# Patient Record
Sex: Male | Born: 1975
Health system: Southern US, Community
[De-identification: ages and names within clinical notes are randomized; demographics above are authoritative.]

## PROBLEM LIST (undated history)

## (undated) DIAGNOSIS — F329 Major depressive disorder, single episode, unspecified: Secondary | ICD-10-CM

## (undated) DIAGNOSIS — F32A Depression, unspecified: Secondary | ICD-10-CM

## (undated) DIAGNOSIS — F909 Attention-deficit hyperactivity disorder, unspecified type: Secondary | ICD-10-CM

## (undated) DIAGNOSIS — R109 Unspecified abdominal pain: Secondary | ICD-10-CM

## (undated) DIAGNOSIS — F1110 Opioid abuse, uncomplicated: Secondary | ICD-10-CM

## (undated) DIAGNOSIS — I10 Essential (primary) hypertension: Secondary | ICD-10-CM

## (undated) DIAGNOSIS — F419 Anxiety disorder, unspecified: Secondary | ICD-10-CM

## (undated) DIAGNOSIS — N400 Enlarged prostate without lower urinary tract symptoms: Secondary | ICD-10-CM

## (undated) DIAGNOSIS — G8929 Other chronic pain: Secondary | ICD-10-CM

---

## 1998-08-21 ENCOUNTER — Emergency Department (HOSPITAL_COMMUNITY): Admission: EM | Admit: 1998-08-21 | Discharge: 1998-08-21 | Payer: Self-pay | Admitting: Emergency Medicine

## 1998-08-22 ENCOUNTER — Encounter: Payer: Self-pay | Admitting: Internal Medicine

## 1998-08-22 ENCOUNTER — Emergency Department (HOSPITAL_COMMUNITY): Admission: EM | Admit: 1998-08-22 | Discharge: 1998-08-23 | Payer: Self-pay | Admitting: Emergency Medicine

## 1998-08-23 ENCOUNTER — Emergency Department (HOSPITAL_COMMUNITY): Admission: EM | Admit: 1998-08-23 | Discharge: 1998-08-23 | Payer: Self-pay | Admitting: Emergency Medicine

## 1998-09-05 ENCOUNTER — Emergency Department (HOSPITAL_COMMUNITY): Admission: EM | Admit: 1998-09-05 | Discharge: 1998-09-05 | Payer: Self-pay | Admitting: Emergency Medicine

## 1998-09-21 ENCOUNTER — Emergency Department (HOSPITAL_COMMUNITY): Admission: EM | Admit: 1998-09-21 | Discharge: 1998-09-21 | Payer: Self-pay

## 2005-06-18 ENCOUNTER — Emergency Department (HOSPITAL_COMMUNITY): Admission: EM | Admit: 2005-06-18 | Discharge: 2005-06-18 | Payer: Self-pay | Admitting: Emergency Medicine

## 2005-06-21 ENCOUNTER — Emergency Department (HOSPITAL_COMMUNITY): Admission: EM | Admit: 2005-06-21 | Discharge: 2005-06-21 | Payer: Self-pay | Admitting: *Deleted

## 2005-06-21 ENCOUNTER — Emergency Department (HOSPITAL_COMMUNITY): Admission: EM | Admit: 2005-06-21 | Discharge: 2005-06-21 | Payer: Self-pay | Admitting: Emergency Medicine

## 2005-08-28 HISTORY — PX: APPENDECTOMY: SHX54

## 2005-09-16 ENCOUNTER — Emergency Department (HOSPITAL_COMMUNITY): Admission: EM | Admit: 2005-09-16 | Discharge: 2005-09-16 | Payer: Self-pay | Admitting: Emergency Medicine

## 2005-09-25 ENCOUNTER — Encounter (INDEPENDENT_AMBULATORY_CARE_PROVIDER_SITE_OTHER): Payer: Self-pay | Admitting: Specialist

## 2005-09-25 ENCOUNTER — Inpatient Hospital Stay (HOSPITAL_COMMUNITY): Admission: EM | Admit: 2005-09-25 | Discharge: 2005-09-27 | Payer: Self-pay | Admitting: Family Medicine

## 2005-10-03 ENCOUNTER — Emergency Department (HOSPITAL_COMMUNITY): Admission: EM | Admit: 2005-10-03 | Discharge: 2005-10-03 | Payer: Self-pay | Admitting: Emergency Medicine

## 2005-10-07 ENCOUNTER — Ambulatory Visit (HOSPITAL_COMMUNITY): Admission: RE | Admit: 2005-10-07 | Discharge: 2005-10-07 | Payer: Self-pay | Admitting: *Deleted

## 2005-10-12 ENCOUNTER — Emergency Department (HOSPITAL_COMMUNITY): Admission: EM | Admit: 2005-10-12 | Discharge: 2005-10-12 | Payer: Self-pay | Admitting: Emergency Medicine

## 2006-01-08 ENCOUNTER — Emergency Department (HOSPITAL_COMMUNITY): Admission: EM | Admit: 2006-01-08 | Discharge: 2006-01-08 | Payer: Self-pay | Admitting: Emergency Medicine

## 2006-01-20 ENCOUNTER — Emergency Department (HOSPITAL_COMMUNITY): Admission: EM | Admit: 2006-01-20 | Discharge: 2006-01-21 | Payer: Self-pay | Admitting: Emergency Medicine

## 2006-01-21 ENCOUNTER — Emergency Department (HOSPITAL_COMMUNITY): Admission: EM | Admit: 2006-01-21 | Discharge: 2006-01-22 | Payer: Self-pay | Admitting: *Deleted

## 2006-01-24 ENCOUNTER — Emergency Department: Payer: Self-pay | Admitting: Emergency Medicine

## 2006-02-06 ENCOUNTER — Emergency Department: Payer: Self-pay | Admitting: Emergency Medicine

## 2006-03-05 ENCOUNTER — Emergency Department (HOSPITAL_COMMUNITY): Admission: EM | Admit: 2006-03-05 | Discharge: 2006-03-05 | Payer: Self-pay | Admitting: Family Medicine

## 2006-05-25 ENCOUNTER — Emergency Department (HOSPITAL_COMMUNITY): Admission: EM | Admit: 2006-05-25 | Discharge: 2006-05-25 | Payer: Self-pay | Admitting: Emergency Medicine

## 2006-06-08 ENCOUNTER — Ambulatory Visit: Payer: Self-pay | Admitting: Psychiatry

## 2006-06-08 ENCOUNTER — Inpatient Hospital Stay (HOSPITAL_COMMUNITY): Admission: AD | Admit: 2006-06-08 | Discharge: 2006-06-11 | Payer: Self-pay | Admitting: Psychiatry

## 2006-06-08 ENCOUNTER — Emergency Department (HOSPITAL_COMMUNITY): Admission: EM | Admit: 2006-06-08 | Discharge: 2006-06-08 | Payer: Self-pay | Admitting: Emergency Medicine

## 2006-06-24 ENCOUNTER — Emergency Department (HOSPITAL_COMMUNITY): Admission: EM | Admit: 2006-06-24 | Discharge: 2006-06-24 | Payer: Self-pay | Admitting: Emergency Medicine

## 2006-07-04 ENCOUNTER — Emergency Department (HOSPITAL_COMMUNITY): Admission: EM | Admit: 2006-07-04 | Discharge: 2006-07-04 | Payer: Self-pay | Admitting: Emergency Medicine

## 2006-07-15 ENCOUNTER — Emergency Department: Payer: Self-pay | Admitting: Emergency Medicine

## 2006-07-25 ENCOUNTER — Emergency Department (HOSPITAL_COMMUNITY): Admission: EM | Admit: 2006-07-25 | Discharge: 2006-07-25 | Payer: Self-pay | Admitting: Emergency Medicine

## 2006-08-26 ENCOUNTER — Emergency Department (HOSPITAL_COMMUNITY): Admission: EM | Admit: 2006-08-26 | Discharge: 2006-08-27 | Payer: Self-pay | Admitting: Emergency Medicine

## 2006-09-01 ENCOUNTER — Emergency Department (HOSPITAL_COMMUNITY): Admission: EM | Admit: 2006-09-01 | Discharge: 2006-09-01 | Payer: Self-pay | Admitting: Emergency Medicine

## 2006-09-02 ENCOUNTER — Emergency Department (HOSPITAL_COMMUNITY): Admission: EM | Admit: 2006-09-02 | Discharge: 2006-09-02 | Payer: Self-pay | Admitting: Emergency Medicine

## 2008-01-27 ENCOUNTER — Emergency Department (HOSPITAL_COMMUNITY): Admission: EM | Admit: 2008-01-27 | Discharge: 2008-01-27 | Payer: Self-pay | Admitting: Emergency Medicine

## 2008-01-29 ENCOUNTER — Emergency Department (HOSPITAL_COMMUNITY): Admission: EM | Admit: 2008-01-29 | Discharge: 2008-01-29 | Payer: Self-pay | Admitting: Emergency Medicine

## 2008-02-04 ENCOUNTER — Inpatient Hospital Stay (HOSPITAL_COMMUNITY): Admission: RE | Admit: 2008-02-04 | Discharge: 2008-02-09 | Payer: Self-pay | Admitting: *Deleted

## 2008-02-04 ENCOUNTER — Ambulatory Visit: Payer: Self-pay | Admitting: *Deleted

## 2008-02-10 ENCOUNTER — Emergency Department (HOSPITAL_COMMUNITY): Admission: EM | Admit: 2008-02-10 | Discharge: 2008-02-10 | Payer: Self-pay | Admitting: Family Medicine

## 2008-02-21 ENCOUNTER — Inpatient Hospital Stay (HOSPITAL_COMMUNITY): Admission: EM | Admit: 2008-02-21 | Discharge: 2008-02-23 | Payer: Self-pay | Admitting: *Deleted

## 2008-02-25 ENCOUNTER — Emergency Department (HOSPITAL_COMMUNITY): Admission: EM | Admit: 2008-02-25 | Discharge: 2008-02-25 | Payer: Self-pay | Admitting: Emergency Medicine

## 2008-02-26 ENCOUNTER — Emergency Department (HOSPITAL_COMMUNITY): Admission: EM | Admit: 2008-02-26 | Discharge: 2008-02-26 | Payer: Self-pay | Admitting: Emergency Medicine

## 2008-02-28 ENCOUNTER — Emergency Department (HOSPITAL_COMMUNITY): Admission: EM | Admit: 2008-02-28 | Discharge: 2008-02-28 | Payer: Self-pay | Admitting: Emergency Medicine

## 2008-04-11 ENCOUNTER — Ambulatory Visit: Payer: Self-pay | Admitting: Family Medicine

## 2008-05-01 ENCOUNTER — Emergency Department (HOSPITAL_COMMUNITY): Admission: EM | Admit: 2008-05-01 | Discharge: 2008-05-01 | Payer: Self-pay | Admitting: Emergency Medicine

## 2008-05-10 ENCOUNTER — Emergency Department: Payer: Self-pay | Admitting: Emergency Medicine

## 2008-05-16 ENCOUNTER — Ambulatory Visit: Payer: Self-pay | Admitting: Internal Medicine

## 2008-05-17 ENCOUNTER — Emergency Department (HOSPITAL_COMMUNITY): Admission: EM | Admit: 2008-05-17 | Discharge: 2008-05-18 | Payer: Self-pay | Admitting: Emergency Medicine

## 2008-05-17 ENCOUNTER — Encounter (INDEPENDENT_AMBULATORY_CARE_PROVIDER_SITE_OTHER): Payer: Self-pay | Admitting: Family Medicine

## 2008-05-18 ENCOUNTER — Emergency Department (HOSPITAL_COMMUNITY): Admission: EM | Admit: 2008-05-18 | Discharge: 2008-05-18 | Payer: Self-pay | Admitting: Emergency Medicine

## 2008-05-19 ENCOUNTER — Inpatient Hospital Stay (HOSPITAL_COMMUNITY): Admission: AD | Admit: 2008-05-19 | Discharge: 2008-05-22 | Payer: Self-pay | Admitting: Psychiatry

## 2008-05-19 ENCOUNTER — Ambulatory Visit: Payer: Self-pay | Admitting: Psychiatry

## 2008-06-14 ENCOUNTER — Inpatient Hospital Stay (HOSPITAL_COMMUNITY): Admission: RE | Admit: 2008-06-14 | Discharge: 2008-06-15 | Payer: Self-pay | Admitting: Psychiatry

## 2008-06-17 ENCOUNTER — Emergency Department (HOSPITAL_COMMUNITY): Admission: EM | Admit: 2008-06-17 | Discharge: 2008-06-17 | Payer: Self-pay | Admitting: Emergency Medicine

## 2008-06-18 ENCOUNTER — Emergency Department (HOSPITAL_COMMUNITY): Admission: EM | Admit: 2008-06-18 | Discharge: 2008-06-18 | Payer: Self-pay | Admitting: Emergency Medicine

## 2008-07-31 ENCOUNTER — Emergency Department (HOSPITAL_COMMUNITY): Admission: EM | Admit: 2008-07-31 | Discharge: 2008-07-31 | Payer: Self-pay | Admitting: Emergency Medicine

## 2008-08-06 ENCOUNTER — Emergency Department (HOSPITAL_COMMUNITY): Admission: EM | Admit: 2008-08-06 | Discharge: 2008-08-06 | Payer: Self-pay | Admitting: Emergency Medicine

## 2008-10-15 ENCOUNTER — Emergency Department (HOSPITAL_COMMUNITY): Admission: EM | Admit: 2008-10-15 | Discharge: 2008-10-15 | Payer: Self-pay | Admitting: Emergency Medicine

## 2008-11-13 ENCOUNTER — Emergency Department (HOSPITAL_COMMUNITY): Admission: EM | Admit: 2008-11-13 | Discharge: 2008-11-13 | Payer: Self-pay | Admitting: Emergency Medicine

## 2008-11-18 ENCOUNTER — Emergency Department (HOSPITAL_COMMUNITY): Admission: EM | Admit: 2008-11-18 | Discharge: 2008-11-19 | Payer: Self-pay | Admitting: Emergency Medicine

## 2008-12-06 ENCOUNTER — Emergency Department (HOSPITAL_COMMUNITY): Admission: EM | Admit: 2008-12-06 | Discharge: 2008-12-06 | Payer: Self-pay | Admitting: Emergency Medicine

## 2008-12-07 ENCOUNTER — Emergency Department (HOSPITAL_COMMUNITY): Admission: EM | Admit: 2008-12-07 | Discharge: 2008-12-07 | Payer: Self-pay | Admitting: Emergency Medicine

## 2008-12-10 ENCOUNTER — Emergency Department (HOSPITAL_COMMUNITY): Admission: EM | Admit: 2008-12-10 | Discharge: 2008-12-10 | Payer: Self-pay | Admitting: Emergency Medicine

## 2008-12-12 ENCOUNTER — Emergency Department (HOSPITAL_COMMUNITY): Admission: EM | Admit: 2008-12-12 | Discharge: 2008-12-12 | Payer: Self-pay | Admitting: Emergency Medicine

## 2010-10-08 LAB — CBC
HCT: 41.5 % (ref 39.0–52.0)
HCT: 42.5 % (ref 39.0–52.0)
Hemoglobin: 14.1 g/dL (ref 13.0–17.0)
Hemoglobin: 14.3 g/dL (ref 13.0–17.0)
MCHC: 34.1 g/dL (ref 30.0–36.0)
MCV: 95 fL (ref 78.0–100.0)
Platelets: 193 10*3/uL (ref 150–400)
RBC: 4.47 MIL/uL (ref 4.22–5.81)
RDW: 13.5 % (ref 11.5–15.5)
RDW: 14 % (ref 11.5–15.5)
WBC: 7.8 10*3/uL (ref 4.0–10.5)

## 2010-10-08 LAB — URINALYSIS, ROUTINE W REFLEX MICROSCOPIC
Hgb urine dipstick: NEGATIVE
Protein, ur: NEGATIVE mg/dL
Specific Gravity, Urine: 1.012 (ref 1.005–1.030)
pH: 6.5 (ref 5.0–8.0)

## 2010-10-08 LAB — DIFFERENTIAL
Basophils Absolute: 0 10*3/uL (ref 0.0–0.1)
Basophils Relative: 0 % (ref 0–1)
Eosinophils Absolute: 0.1 10*3/uL (ref 0.0–0.7)
Eosinophils Relative: 1 % (ref 0–5)
Lymphs Abs: 1.4 10*3/uL (ref 0.7–4.0)
Monocytes Absolute: 0.4 10*3/uL (ref 0.1–1.0)
Neutro Abs: 11.2 10*3/uL — ABNORMAL HIGH (ref 1.7–7.7)
Neutro Abs: 5.8 10*3/uL (ref 1.7–7.7)

## 2010-10-08 LAB — RAPID URINE DRUG SCREEN, HOSP PERFORMED
Amphetamines: NOT DETECTED
Barbiturates: NOT DETECTED
Barbiturates: NOT DETECTED
Benzodiazepines: NOT DETECTED
Cocaine: POSITIVE — AB
Opiates: POSITIVE — AB
Tetrahydrocannabinol: NOT DETECTED

## 2010-10-08 LAB — POCT I-STAT, CHEM 8
BUN: 11 mg/dL (ref 6–23)
Chloride: 105 mEq/L (ref 96–112)
HCT: 45 % (ref 39.0–52.0)
Hemoglobin: 15.3 g/dL (ref 13.0–17.0)
Potassium: 4 mEq/L (ref 3.5–5.1)
Sodium: 139 mEq/L (ref 135–145)

## 2010-10-08 LAB — ETHANOL
Alcohol, Ethyl (B): <5 mg/dL (ref 0–10)
Alcohol, Ethyl (B): <5 mg/dL (ref 0–10)

## 2010-10-08 LAB — BASIC METABOLIC PANEL
BUN: 9 mg/dL (ref 6–23)
Calcium: 8.9 mg/dL (ref 8.4–10.5)
GFR calc Af Amer: >60 mL/min (ref >60)
GFR calc non Af Amer: >60 mL/min (ref >60)
Glucose, Bld: 99 mg/dL (ref 70–99)
Potassium: 3.5 mEq/L (ref 3.5–5.1)

## 2010-10-08 LAB — TRICYCLICS SCREEN, URINE: TCA Scrn: NOT DETECTED

## 2010-11-12 NOTE — H&P (Signed)
[name], [name] NO.:  [id number]   MEDICAL RECORD NO.:  [id number]          PATIENT TYPE:  IPS   LOCATION:  0605                          FACILITY:  [facility]   PHYSICIAN:  Jasmine Pang, M.D. DATE OF BIRTH:  [birth year]   DATE OF ADMISSION:  02/05/2008  DATE OF DISCHARGE:                       PSYCHIATRIC ADMISSION ASSESSMENT   This is a voluntary admission to the services of Dr. Milford Cage.   IDENTIFYING INFORMATION:  This is a 35 year old single white male.  He  was just released from prison around January 25, 2008.  He cannot find  work.  He reports having relapsed on cocaine on February 03, 2008.  He has  been staying with his parents.  He reports that he has severe depression  and anxiety.  He also reports ADD.  His current stressors are financial  issues.  No employment.  He states that when he was here with [place] last  back in 2007, he had benefited from Lexapro and Adderall.  He states his  mother is willing to buy his medication and he is currently being  treated for prostatitis.  He states that he has been eating okay, but he  has some trouble with sleep.   PAST PSYCHIATRIC HISTORY:  [name] was last with [place] June 08, 2006 to  June 11, 2006.  At that time, he was noted to have been suicidal  with attempt.  He had overdosed on OxyContin.  He also endorsed OCD and  history of ADHD at that time.   SOCIAL HISTORY:  He is a high Garment/textile technologist in 1996.  He has never  married.  He does not have any children.  He used to be employed in  [employer].   FAMILY HISTORY:  Noncontributory.   ALCOHOL/DRUG HISTORY:  He began using drugs and alcohol in his 39s, THC  in his 100s, alcohol at 42 and cocaine at 50.   PRIMARY CARE Supriya Beaston:  He is currently enrolled with the [facility].   MEDICAL PROBLEMS:  Prostatitis.   MEDICATIONS:  1. He was prescribed doxycycline 100 mg p.o. daily.  2. Cardura 1 mg p.o. in the a.m. and 2 mg at  bedtime.  3. Percocet 5/325 two q.6 h. p.r.n. pain from prostatitis.   DRUG ALLERGIES:  NO KNOWN DRUG ALLERGIES.   PHYSICAL EXAMINATION:  He was just recently medically cleared in the  emergency room after being discharged several days ago.  His labs were  repeated on admission.  His glucose was elevated at 133.  His CBC had no  abnormalities.  His urinalysis which was done January 29, 2008 was also  benign.  He had no abnormal findings.  No indication that his  prostatitis was active.  We do not have any urine drug screen available,  although he reports a recent relapse on cocaine.  Vital signs on  admission show he is 67 inches tall.  He weighs 151, temperature is  97.2, blood pressure is 109/63 to 103/66, pulse was 73-76 and  respirations are 18.  He has a scar consistent with his report of  an  appendectomy in 2007.  Other than prostatitis, he has no other active  medical issues.   MENTAL STATUS EXAM:  Today, he was alert and oriented.  His appearance  was somewhat disheveled.  His eye contact was fair.  His speech was slow  and soft.  His level of consciousness was alert.  His mood was depressed  and anxious.  His anxiety level was reported as severe.  His thought  processes were coherent.  His thought content was within normal limits.  His perceptions were normal.  He does report suicidal ideation with no  specific plan.  He is not homicidal.  He has no auditory or visual  hallucinations.   AXIS I:  Major depressive disorder severe without psychotic features.  History for attention deficit hyperactivity disorder treated with  Adderall in the past.  AXIS II:  Deferred.  AXIS III:  History for prostatitis currently being treated with Cardura  and doxycycline. AXIS IV:  Severe.  AXIS V:  30.   PLAN:  Admit for safety and stabilization.  We will try to start  medication to address his depression and suicidal ideation.  Toward that  end, we will start Celexa 10 mg a.m. and h.s.  We  will discuss generic  Adderall if his mother can afford this.  We will have the case manager  work with placement.  He has done [employer] in 2001 and that may  be a way to get him employed.  Estimated length of stay is 3-5 days.      Mickie Leonarda Salon, P.A.-C.      Jasmine Pang, M.D.  Electronically Signed    MD/MEDQ  D:  02/05/2008  T:  02/05/2008  Job:  [id number]

## 2010-11-12 NOTE — H&P (Signed)
[name], [name] NO.:  [id number]   MEDICAL RECORD NO.:  [id number]          PATIENT TYPE:  IPS   LOCATION:  0502                          FACILITY:  [facility]   PHYSICIAN:  Anselm Jungling, MD  DATE OF BIRTH:  Feb 22, 1976   DATE OF ADMISSION:  05/19/2008  DATE OF DISCHARGE:                       PSYCHIATRIC ADMISSION ASSESSMENT   This is a voluntary admission to the services of Dr. Geralyn Flash.   IDENTIFYING INFORMATION:  This is a 35 year old single white male.  He  originally presented to the emergency department on November 19.  He was  assessed and referred for outpatient treatment then he returned changing  his tune.  He was now suicidal with a plan to use carbon monoxide or  jump from a height.  He reported frequent panic attacks, feeling  hopeless, anhedonia, sleeping all day, OCD symptoms including compulsive  shoplifting and lock checking.  He reported relapsing on alcohol last  week, stated he drank a fifth of liquor within 24 hours but his alcohol  level was only 41.  Stressors include unemployment, conflicts with his  parents, financial stress and poor support.  He had also fallen out of a  deer stand hurting himself and that was his original presentation to the  ED.   PAST PSYCHIATRIC HISTORY:  This is his third or fourth admission here to  the [facility].  He was last with [place] August 24 to August 26.  He frequently feels suicidal and that is why he comes into hospital.  He  was also dealing with prostatitis back in August.   SOCIAL HISTORY:  He has never married.  He has no children.  He states  he has looked at the quick jobs program out at [facility] but never followed  through.   FAMILY HISTORY:  Noncontributory.   ALCOHOL AND DRUG HISTORY:  He reports that he has been using alcohol  since he was a teenager.  He states that he recently relapsed on  alcohol.   PRIMARY CARE PHYSICIAN:  It is not clear, he does not have one at  present.  His urologist is Dr. Vernie Ammons.  Again, he has not seen Dr.  Vernie Ammons recently.  He reports having had blood in his urine and hence he  was felt to have prostatitis the other day.  He is followed at [facility] by Dr. Allyne Gee who writes his meds.  He is status  post an appendectomy in 2007.   CURRENT MEDICATIONS:  1. Cymbalta 60 mg take two in the morning for a total of 120 mg.  2. Adderall 10 mg a.m. and 10 mg at noon.  3. Percocet 5 mg p.o. take two q.6 h p.r.n.  4. Klonopin 0.5 mg p.o. b.i.d.  5. Cipro 500 mg p.o. daily.   DRUG ALLERGIES:  He is allergic to CELEXA.  It gave him hives and he  could not breathe.   POSITIVE PHYSICAL FINDINGS:  His alcohol level was 41.  His urine was  positive for opiates, benzos and amphetamines but he is prescribed.  He  does have  scattered bruises and abrasions consistent with his fall out  of the deer stand.  He is status post removal of a sebaceous cyst in his  left cheek on May 18, 2008.  His vital signs on admission show he  is 69 inches tall, weighs 157-1/2, temperature is 97.6, blood pressure  is 124/77 to 111/68, pulse ranged from 86-98, respirations are 16-18.   MENTAL STATUS EXAM:  Today he is alert and oriented.  He was seen in the  bed.  He was appropriately groomed, dressed and nourished.  His speech  is normal rate, rhythm and tone.  His mood is depressed as is his  affect.  His thought processes are clear, rational and goal oriented.  He does not want any of his present medicines changed.  Judgment and  insight are fair.  Concentration and memory are intact.  Intelligence is  at least average.  He is not actively suicidal or homicidal.  He is not  having any auditory visual hallucinations.   DIAGNOSES:  AXIS I:  Mood disorder, depressed, ADHD (attention deficit  hyperactivity disorder) on medications since age 27 for 25 years.  AXIS II:  Deferred.  AXIS III:  Prostatitis for a number of months, recent  trauma fell out of  a deer stand healing abrasions and bruises.  AXIS IV:  Severe - problems with primary support group, occupational,  housing, economic issues.  AXIS V:  30.   PLAN:  Is to admit for safety and stabilization.  To adjust his meds as  indicated.  Dr. Dub Mikes has already evaluated the patient today and just  wants to continue current meds.  We may have to consider getting a  urology consult on Monday as his prostatitis should have cleared by now.  Estimated length of stay is 3-5 days.      Mickie Leonarda Salon, P.A.-C.      Anselm Jungling, MD  Electronically Signed    MD/MEDQ  D:  05/20/2008  T:  05/20/2008  Job:  [id number]

## 2010-11-12 NOTE — Discharge Summary (Signed)
[name], [name] NO.:  [id number]   MEDICAL RECORD NO.:  [id number]          PATIENT TYPE:  IPS   LOCATION:  0302                          FACILITY:  [facility]   PHYSICIAN:  Jasmine Pang, M.D. DATE OF BIRTH:  [birth year]   DATE OF ADMISSION:  02/21/2008  DATE OF DISCHARGE:  02/23/2008                               DISCHARGE SUMMARY   IDENTIFYING INFORMATION:  This is a 35 year old single white male who  was admitted on a voluntary basis on February 21, 2008.   HISTORY OF PRESENT ILLNESS:  The patient states he began to feel  suicidal.  He was worried he could not find a job.  He began to feel  depressed and anxious for the past several days and this culminated in  increasing suicidal ideation.  He states he was going to the [facility] and they just increased the Celexa within the past week, but he  kept taking the 20 mg instead of 40 mg it was increased to.  He is  willing now to increase his dose of Celexa to 40 mg.  He denies any drug  use now, but has used cocaine in the past.  He denies any alcohol use.   PAST PSYCHIATRIC HISTORY:  The patient was here in August 2009.  He has  a history of cutting.  As indicated above, he goes to the [facility] for psychiatric management.   ALCOHOL AND DRUG HISTORY:  As indicated above.  No alcohol or drug use  now.  He has used cocaine in the past.   MEDICAL PROBLEMS:  Prostatitis.   MEDICATIONS:  1. Adderall 10 mg in the a.m. and noon.  2. Celexa 20 mg q.a.m. (supposed to be increased to 40 mg, but he has      not done this yet.)  3. Cardura 2 mg at bedtime for prostatitis.  4. Doxycycline 100 mg p.o. b.i.d. for prostatitis.  5. Percocet 5 mg p.o. q.4-6 hours p.r.n. pain.   PHYSICAL FINDINGS:  There were no acute physical or medical problems  noted other than his prostatitis.   ADMISSION LABORATORIES:  DNA probe was negative for chlamydia and  gonorrhea.  The UDS was negative.   HOSPITAL COURSE:  Upon admission, the patient was restarted on his home  medications with doxycycline 100 mg b.i.d., Cardura 2 mg p.o. q.h.s.,  Adderall 10 mg in the morning and at noon, Percocet 5 mg one q.4-6 h  p.r.n. for pain, and Celexa was increased from 20 mg daily to 40 mg  daily.  He also was given some Imodium for diarrhea, which helped  resolve this.  He was given Ambien 10 mg p.o. q.h.s. p.r.n. insomnia.  In individual sessions with me, the patient was reserved, but friendly  and cooperative.  He did participate appropriately in unit therapeutic  groups and activities.  He discussed his escalating depression and  anxiety in the past several days.  He states he began to feel suicidal.  He is worried he cannot get a job.  He felt hopeless and helpless.  As  hospitalization progressed, mental status improved.  On February 23, 2008,  mood was much less depressed and less anxious.  Affect consistent with  mood.  There was no suicidal or homicidal ideation.  No thoughts of self-  injurious behavior.  No auditory or visual hallucinations.  No paranoia  or delusions.  Thoughts were logical and goal-directed.  Thought  content, no predominant theme.  Cognitive was grossly intact.  Insight  was good.  Judgment was good.  Impulse control was good.  He wanted to  go home today and was felt he was safe for discharge.   DISCHARGE DIAGNOSES:  Axis I: Major depressive disorder, recurrent,  severe without psychosis.  Axis II: None.  Axis III: Prostatitis.  Axis IV: Severe (problems with primary support group, occupational  problem, economic problem, medical problems, and burden of psychiatric  illness).  Axis V: Global assessment of functioning is 50 upon discharge.  GAF was  35 upon admission.  GAF highest past year was 65.   DISCHARGE PLAN:  There was no specific activity level or dietary  restriction.   POSTHOSPITAL CARE PLANS:  The patient will see Dr. Lang Snow at the  [facility] on  February 29, 2008, at 1 o'clock p.m.  He will also go  to [facility] to see Gerda Diss on March 07, 2008, at 12:30  p.m.   DISCHARGE MEDICATIONS:  1. Doxycycline 100 mg twice daily.  2. Adderall tablets 10 mg in the morning and noon.  3. Celexa 40 mg daily.  4. Cardura 2 mg at bedtime.  5. Ambien 10 mg at bedtime as needed for sleep.  6. Take Percocet as advised by his urologist.      Jasmine Pang, M.D.  Electronically Signed     BHS/MEDQ  D:  02/27/2008  T:  02/28/2008  Job:  [id number]

## 2010-11-15 NOTE — H&P (Signed)
[name], CHENIER               ACCOUNT NO.:  [id number]   MEDICAL RECORD NO.:  [id number]          PATIENT TYPE:  INP   LOCATION:  5735                         FACILITY:  [facility]   PHYSICIAN:  Ollen Gross. Vernell Morgans, M.D. DATE OF BIRTH:  October 24, 1975   DATE OF ADMISSION:  09/24/2005  DATE OF DISCHARGE:                                HISTORY & PHYSICAL   HISTORY OF PRESENT ILLNESS:  Mr. [name] is a 35 year old white male who  presents to the emergency department tonight with a two week history of  right lower quadrant pain.  He has intermittently been having fevers to 101  at home.  He has had some diarrhea in the last two weeks.  No real nausea or  vomiting.  His pain has not resolved which caused him to seek medical  attention.  He otherwise denies any chest pain, shortness of breath, or  dysuria.  Other review of systems unremarkable.   PAST MEDICAL HISTORY:  None.   PAST SURGICAL HISTORY:  None.   MEDICATIONS:  None.   ALLERGIES:  IBUPROFEN.   SOCIAL HISTORY:  He does smoke about a pack of cigarettes a day, and only  drinks alcohol on the weekends.   FAMILY HISTORY:  Significant for hypertension and diabetes.   PHYSICAL EXAMINATION:  VITAL SIGNS:  Temperature is 99.5, blood pressure  108/65, pulse 97.  GENERAL:  He is a well-developed, well-nourished white male in no acute  distress.  SKIN:  Warm and dry with no jaundice.  HEENT:  Eyes:  His extraocular movements are intact.  Pupils equal, round,  reactive to light.  Sclerae non-icteric.  LUNGS:  Clear bilaterally with no use of accessory respiratory muscles.  HEART:  Regular rate and rhythm with an impulse in the left chest.  ABDOMEN:  Soft with focal right lower quadrant tenderness, but no signs of  peritonitis.  EXTREMITIES:  No cyanosis, clubbing, or edema.  Good strength in his arms  and legs.  PSYCHIATRIC:  He is alert and oriented x3 with no evidence of anxiety or  depression.   LABORATORY DATA:  On review of his  lab work, it was significant for a white  count of 13.7.  His CT scan was reviewed with the radiologist and did show a  very enlarged and inflamed appendix with a large appendicolith.  This  appendicolith has been present on plain films since at least December.   ASSESSMENT AND PLAN:  This is a 35 year old white male with what appears to  be acute appendicitis with a large inflamed appendix.  Because of the risk  of perforation and sepsis, I think he would benefit from having his  appendix removed tonight.  I have explained to him in detail the risks and  benefits of the operation to do this, as well as some of the technical  aspects, and he understands and wishes to proceed.  We will obtain some  routine preoperative laboratory work in preparation for doing this tonight.      Ollen Gross. Vernell Morgans, M.D.  Electronically Signed     PST/MEDQ  D:  09/25/2005  T:  09/25/2005  Job:  [id number]

## 2010-11-15 NOTE — Discharge Summary (Signed)
Stephen, French NO.:  [id number]   MEDICAL RECORD NO.:  [id number]          PATIENT TYPE:  IPS   LOCATION:  0502                          FACILITY:  [facility]   PHYSICIAN:  Geoffery Lyons, M.D.      DATE OF BIRTH:  Sep 30, 1975   DATE OF ADMISSION:  05/19/2008  DATE OF DISCHARGE:  05/22/2008                               DISCHARGE SUMMARY   CHIEF COMPLAINT:  This was the third or fourth admission to [facility] for this 35 year old single white male, who presented  to the emergency department on May 18, 2008.  Initially he was  assessed and referred for outpatient treatment.  He came back stating he  was suicidal, with a plan to use carbon monoxide or jump from a height.  He endorsed frequent panic attacks, feeling hopeless, anhedonic,  sleeping all day, OCD symptoms including compulsive shoplifting and lock  shaking.  He reported relapsing on alcohol the week before, drinking a  fifth of liquor within 24 hours.  Stressors include unemployment,  conflict with his parents, financial stress and poor support.  He also  had a fall from a deer stand, hurting himself.   PAST PSYCHIATRIC HISTORY:  The third or fourth time at [facility].  His last admission was November 24-26, 2009.  Endorsed that he  frequently feels suicidal and that is when he comes to the hospital.   PAST MEDICAL HISTORY:  1. He has been using alcohol since he was 35 years old; recently      relapsed.  2. History of prostatitis.   MEDICATIONS:  1. Cymbalta 50 mg in the morning and in the afternoon.  2. Adderall 10 mg in the morning and in the afternoon.  3. Percocet 5 every 6 hours as needed for pain.  4. Klonopin 0.5 twice a day.  5. Cipro 500 mg per day.   PHYSICAL EXAMINATION:  Failed to show any acute findings other than  scattered bruises and abrasions,  consistent with the fall.   MENTAL STATUS EXAMINATION:  Revealed an alert, cooperative male;  appropriately  groomed, dressed and nourished.  Speech was normal rate,  rhythm and tone.  Mood is depressed, anxious.  Affect anxious.  Thought  processes are clear, rational and goal oriented.  He did not want his  medication to be changed.  Endorsed no active suicidal or homicidal  ideas.  No evidence of delusions or hallucinations.  Cognition well-  preserved.   ASSESSMENT:  AXIS I:  1. Mood disorder NOS.  2. Attention deficit hyperactivity disorder.  3. Anxiety disorder NOS.  4. Alcohol abuse, rule out dependence.  AXIS II: No diagnosis.  AXIS III:  1. Prostatitis.  2. Status post trauma, given abrasions and bruises.  AXIS IV: Moderate.  AXIS V:  On admission 38, in the last year 60.   The patient was admitted and started with individual and group  psychotherapy.  We went ahead and maintained the medications.  As  already stated, a 35 year old male; single, living with parents.  He  endorsed he got to a  point he was feeling that life was not worth  living.  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  He has no money.  Getting worse.  He has been to  [facility] twice since August 2009.  Has been admitted with pain  after he fell from a deer stand.   On May 21, 2008 he was in full contact with reality.  He wanted to  be discharged, and endorsed at that time with no suicidal or homicidal  ideas, no hallucination or delusions.  He endorsed that he was aware  that he could not drink; will go back to AA and go see his sponsor.   DISCHARGE DIAGNOSES:  AXIS I:  1. Attention deficit hyperactivity disorder.  2. Anxiety disorder NOS.  3. Alcohol abuse, rule out dependence.  AXIS II: No diagnosis.  AXIS III:  1. Prostatitis.  2. Status post trauma after fall, with abrasions and bruises.  AXIS IV: Moderate.  AXIS V:  Upon discharge 50-55.   DISCHARGE MEDICATIONS:  1. Cipro 500 mg per day.  2. Cymbalta 60 mg per day.  3. Adderall 10 mg twice a day.   4. Trazodone 50 mg at night.  5. Klonopin 0.5 at night, as needed for sleep.      Geoffery Lyons, M.D.  Electronically Signed     IL/MEDQ  D:  06/21/2008  T:  06/22/2008  Job:  [id number]
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
## 2010-11-15 NOTE — Op Note (Signed)
NAMECONER, GIBBARD               ACCOUNT NO.:  [id number]   MEDICAL RECORD NO.:  [id number]          PATIENT TYPE:  INP   LOCATION:  5735                         FACILITY:  [facility]   PHYSICIAN:  Ollen Gross. Vernell Morgans, M.D. DATE OF BIRTH:  17-Oct-1975   DATE OF PROCEDURE:  09/25/2005  DATE OF DISCHARGE:                                 OPERATIVE REPORT   PREOPERATIVE DIAGNOSIS:  Appendicitis.   POSTOP DIAGNOSIS:  Appendicitis.   PROCEDURES:  Aborted laparoscopic and subsequent open appendectomy.   SURGEON:  Dr. Carolynne Edouard.   ANESTHESIA:  General endotracheal.   PROCEDURE:  After informed consent was obtained, the patient was brought to  the operating placed in supine position on the operating room table.  After  induction of general anesthesia, the patient's abdomen was prepped with  Betadine and draped in usual sterile manner.  The area below the umbilicus  was infiltrated 0.25% Marcaine.  Small incision was made with a 15 blade  knife.  This incision was carried down through the subcutaneous tissue  bluntly with a hemostat and [employer]y retractors until the linea alba was  identified.  The linea alba incised with a 15 blade knife and each side was  grasped Kocher clamps, elevated anteriorly.  The preperitoneal space was  then probed bluntly with a hemostat until the peritoneum was opened and  access was gained to the abdominal cavity.  A 0 Vicryl pursestring stitch  was placed in the fascia surrounding the opening.  A Hasson cannula was  placed through the opening and anchored in place with the previously placed  Vicryl pursestring stitch.  The abdomen was then insufflated with carbon  dioxide without difficulty.  The patient was placed in Trendelenburg  position, rotated with the right side up.  The suprapubic area was then  infiltrated 0.25% Marcaine.  A small incision was made with a 15 blade knife  and a 10 mm port was placed bluntly through this incision into the abdominal  cavity  under direct vision between these two and a site was chosen for a 5  mm port.  This area was infiltrated 0.25% Marcaine.  A small stab incision  was made with a 15 blade knife and a 5 mm port was placed bluntly through  this incision into the abdominal cavity under direct vision.  The  laparoscope was then moved to the suprapubic port and using a Glassman  grasper and Harmonic scalpel, the right lower quadrant was evaluated.  The  right colon was able to be peeled away from the peritoneum and  retroperitoneum on the right side.  As this was done a small pocket of  infection was encountered which was aspirated and what appeared to be a  appendicolith was then visualized free in this pocket.  A laparoscopic bag  was then inserted through the Hasson cannula and used to pick up the  appendicolith.  The bag was then closed and brought out with the Hasson  cannula through the infraumbilical port without difficulty.  The Hasson  cannula was then replaced.  The right lower quadrant was inspected more  but  it appeared as though the inflammation was too much to really separate any  of the structures.  At this point a laparoscopic portion of the procedure  was stopped.  A site was chosen for a right lower quadrant transverse  incision.  This incision was made with a 15 blade knife.  The incision was  carried down through the skin and subcutaneous tissue, fascia and muscle of  the abdominal wall until the abdomen was entered, Richardson retractors were  used to retract on the wound.  The cecum was gradually mobilized up into the  wound by blunt finger dissection.  The takeoff and junction of the appendix  with the cecum was able to be identified.  The mesoappendix was opened at  this point bluntly with a Kelly clamp.  A laparoscopic 45 stapler with a  blue load was then placed across the base of the appendix at its junction  with the cecum, clamped and fired thereby dividing base of the appendix   between staple lines.  The rest of the appendix was mobilized bluntly with  finger dissection.  The mesoappendix was taken down with the harmonic  scalpel.  Once this was accomplished, the appendix was removed from the  patient and sent to pathology for further evaluation.  The abdomen was then  irrigated with copious amounts of saline.  The staple line appeared to be  viable and intact.  At this point the layers of the fascia of the abdominal  wall was closed in two layers with two running #1 PDS sutures. Each layer  was irrigated as it was closed and the rest of ports removed under direct  vision and the fascial defect of the infraumbilical port was closed with the  previously Vicryl pursestring stitch.  The laparoscopic ports were closed  with staples and the right lower quadrant transverse incision was also  closed loosely with staples and Telfa wicks were placed between the staples.  Sterile dressings were then applied.  The patient tolerated procedure well.  At the end of the case all needle, sponge and instrument counts correct.  The patient was awakened and taken recovery in stable condition.      Ollen Gross. Vernell Morgans, M.D.  Electronically Signed     PST/MEDQ  D:  09/25/2005  T:  09/26/2005  Job:  [id number]

## 2011-03-28 LAB — DRUGS OF ABUSE SCREEN W/O ALC, ROUTINE URINE
Amphetamine Screen, Ur: POSITIVE — AB
Cocaine Metabolites: NEGATIVE
Creatinine,U: 178.3
Methadone: NEGATIVE
Opiate Screen, Urine: NEGATIVE
Propoxyphene: NEGATIVE

## 2011-03-28 LAB — DIFFERENTIAL
Basophils Absolute: 0
Basophils Relative: 1
Eosinophils Absolute: 0.1
Lymphocytes Relative: 28
Lymphs Abs: 1.3
Monocytes Relative: 7
Monocytes Relative: 8
Neutro Abs: 4.1
Neutrophils Relative %: 69
Neutrophils Relative %: 60

## 2011-03-28 LAB — URINALYSIS, ROUTINE W REFLEX MICROSCOPIC
Bilirubin Urine: NEGATIVE
Bilirubin Urine: NEGATIVE
Hgb urine dipstick: NEGATIVE
Ketones, ur: NEGATIVE
Nitrite: NEGATIVE
Nitrite: NEGATIVE
Nitrite: NEGATIVE
Protein, ur: NEGATIVE
Protein, ur: NEGATIVE
Protein, ur: NEGATIVE
Specific Gravity, Urine: 1.015
Specific Gravity, Urine: 1.022
Urobilinogen, UA: 0.2
pH: 5.5

## 2011-03-28 LAB — COMPREHENSIVE METABOLIC PANEL
ALT: 14
AST: 18
Albumin: 4.2
CO2: 26
Chloride: 101
Creatinine, Ser: 1.01
GFR calc Af Amer: >60
GFR calc non Af Amer: >60
Sodium: 138
Total Bilirubin: 0.9

## 2011-03-28 LAB — CBC
HCT: 42.9
MCHC: 34.5
MCV: 92.8
MCV: 94
Platelets: 169
Platelets: 178
RBC: 4.75
RBC: 4.61
RBC: 4.62
RDW: 13.3
WBC: 4.7
WBC: 5.3

## 2011-03-28 LAB — POCT I-STAT, CHEM 8
BUN: 12
Calcium, Ion: 1.24
Creatinine, Ser: 1.2
Glucose, Bld: 97
HCT: 47
Hemoglobin: 16
Hemoglobin: 16
Potassium: 4
TCO2: 27

## 2011-03-28 LAB — AMPHETAMINES URINE CONFIRMATION
Methamphetamine GC/MS, Ur: NEGATIVE
Methylenedioxyamphetamine: NEGATIVE
Methylenedioxyethylamphetamine: NEGATIVE
Methylenedioxymethamphetamine: NEGATIVE

## 2011-03-28 LAB — RPR: RPR Ser Ql: NONREACTIVE

## 2011-03-28 LAB — GC/CHLAMYDIA PROBE AMP, GENITAL
Chlamydia, DNA Probe: NEGATIVE
GC Probe Amp, Genital: NEGATIVE

## 2011-03-28 LAB — OCCULT BLOOD X 1 CARD TO LAB, STOOL: Fecal Occult Bld: POSITIVE

## 2011-04-01 LAB — COMPREHENSIVE METABOLIC PANEL
BUN: 10
CO2: 24
Calcium: 8.8
Chloride: 107
Creatinine, Ser: 0.91
GFR calc non Af Amer: >60
Total Bilirubin: 0.5

## 2011-04-01 LAB — URINALYSIS, ROUTINE W REFLEX MICROSCOPIC
Hgb urine dipstick: NEGATIVE
Nitrite: NEGATIVE
Protein, ur: NEGATIVE
Urobilinogen, UA: 0.2

## 2011-04-01 LAB — DIFFERENTIAL
Basophils Absolute: 0.1
Lymphocytes Relative: 18
Lymphs Abs: 1.4
Neutro Abs: 5.6
Neutrophils Relative %: 70

## 2011-04-01 LAB — CBC
HCT: 38.5 — ABNORMAL LOW
MCHC: 34.1
MCV: 93.4
RBC: 4.13 — ABNORMAL LOW
WBC: 8

## 2011-04-01 LAB — SALICYLATE LEVEL: Salicylate Lvl: <4

## 2011-04-01 LAB — RAPID URINE DRUG SCREEN, HOSP PERFORMED
Amphetamines: POSITIVE — AB
Barbiturates: NOT DETECTED

## 2011-04-01 LAB — ACETAMINOPHEN LEVEL: Acetaminophen (Tylenol), Serum: <10 — ABNORMAL LOW

## 2011-04-04 LAB — URINALYSIS, ROUTINE W REFLEX MICROSCOPIC
Bilirubin Urine: NEGATIVE
Glucose, UA: NEGATIVE mg/dL
Nitrite: NEGATIVE
Protein, ur: NEGATIVE mg/dL
Protein, ur: NEGATIVE mg/dL
Specific Gravity, Urine: 1.017 (ref 1.005–1.030)
Urobilinogen, UA: 0.2 mg/dL (ref 0.0–1.0)

## 2011-04-04 LAB — COMPREHENSIVE METABOLIC PANEL
ALT: 17 U/L (ref 0–53)
AST: 17 U/L (ref 0–37)
Alkaline Phosphatase: 49 U/L (ref 39–117)
CO2: 29 mEq/L (ref 19–32)
Calcium: 8.7 mg/dL (ref 8.4–10.5)
Chloride: 104 mEq/L (ref 96–112)
GFR calc Af Amer: >60 mL/min (ref >60)
GFR calc non Af Amer: >60 mL/min (ref >60)
Potassium: 3.5 mEq/L (ref 3.5–5.1)
Sodium: 141 mEq/L (ref 135–145)
Total Bilirubin: 0.8 mg/dL (ref 0.3–1.2)

## 2011-04-04 LAB — URINE MICROSCOPIC-ADD ON

## 2011-04-04 LAB — OCCULT BLOOD X 1 CARD TO LAB, STOOL: Fecal Occult Bld: NEGATIVE

## 2011-04-04 LAB — URINE DRUGS OF ABUSE SCREEN W ALC, ROUTINE (REF LAB)
Cocaine Metabolites: NEGATIVE
Methadone: NEGATIVE
Opiate Screen, Urine: NEGATIVE
Phencyclidine (PCP): NEGATIVE
Propoxyphene: NEGATIVE

## 2011-04-04 LAB — AMPHETAMINES URINE CONFIRMATION
Methamphetamine GC/MS, Ur: NEGATIVE
Methylenedioxyamphetamine: NEGATIVE
Methylenedioxymethamphetamine: NEGATIVE

## 2011-07-16 ENCOUNTER — Encounter (HOSPITAL_COMMUNITY): Payer: Self-pay | Admitting: *Deleted

## 2011-07-16 ENCOUNTER — Emergency Department (HOSPITAL_COMMUNITY)
Admission: EM | Admit: 2011-07-16 | Discharge: 2011-07-16 | Disposition: A | Discharge status: Home / Self Care | Payer: Self-pay | Attending: Emergency Medicine | Admitting: Emergency Medicine

## 2011-07-16 DIAGNOSIS — N342 Other urethritis: Secondary | ICD-10-CM | POA: Insufficient documentation

## 2011-07-16 HISTORY — DX: Depression, unspecified: F32.A

## 2011-07-16 HISTORY — DX: Benign prostatic hyperplasia without lower urinary tract symptoms: N40.0

## 2011-07-16 HISTORY — DX: Anxiety disorder, unspecified: F41.9

## 2011-07-16 HISTORY — DX: Attention-deficit hyperactivity disorder, unspecified type: F90.9

## 2011-07-16 HISTORY — DX: Major depressive disorder, single episode, unspecified: F32.9

## 2011-07-16 LAB — URINALYSIS, ROUTINE W REFLEX MICROSCOPIC
Bilirubin Urine: NEGATIVE
Glucose, UA: NEGATIVE mg/dL
Protein, ur: NEGATIVE mg/dL

## 2011-07-16 LAB — URINE MICROSCOPIC-ADD ON

## 2011-07-16 MED ORDER — AZITHROMYCIN 250 MG PO TABS
1000.0000 mg | ORAL_TABLET | Freq: Once | ORAL | Status: AC
  Administered 2011-07-16: 1000 mg via ORAL
  Filled 2011-07-16: qty 1
  Filled 2011-07-16: qty 3

## 2011-07-16 MED ORDER — CEFTRIAXONE SODIUM 250 MG IJ SOLR
250.0000 mg | Freq: Once | INTRAMUSCULAR | Status: AC
  Administered 2011-07-16: 250 mg via INTRAMUSCULAR
  Filled 2011-07-16: qty 250

## 2011-07-16 NOTE — ED Provider Notes (Signed)
History     CSN: [id number]  Arrival date & time 07/16/11  1126   First MD Initiated Contact with Patient 07/16/11 1240      Chief Complaint  Patient presents with  . Penile Discharge    (Consider location/radiation/quality/duration/timing/severity/associated sxs/prior treatment) Patient is a 36 y.o. male presenting with penile discharge. The history is provided by the patient.  Penile Discharge Pertinent negatives include no shortness of breath.  pt c/o whitish penile discharge on and off for past 5 years. Denies dysuria. Indicates was told possible due to 'chronic prostatitis' in past. No abd or flank pain. No nv. No fever or chills. No scrotal or testicular pain. No ulcerations. No known std exposure, states a prior, remote test for stds was negative. Had seen a urologist at [facility] but not in recent months.  Current symptoms present for past day. No exacerbating or alleviating factors.   Past Medical History  Diagnosis Date  . BPH (benign prostatic hyperplasia)   . Anxiety   . Depression   . ADHD (attention deficit hyperactivity disorder)     Past Surgical History  Procedure Date  . Appendectomy 08/2005    Family History  Problem Relation Age of Onset  . Hypertension Father   . Prostate cancer Other     History  Substance Use Topics  . Smoking status: Former Smoker    Quit date: 07/02/2011  . Smokeless tobacco: Current User    Types: Chew  . Alcohol Use: No     quit in 2010      Review of Systems  Constitutional: Negative for fever and chills.  Respiratory: Negative for shortness of breath.   Genitourinary: Positive for discharge. Negative for dysuria, hematuria, flank pain, genital sores and testicular pain.  Musculoskeletal: Negative for myalgias and back pain.  Skin: Negative for rash and wound.    Allergies  Lexapro  Home Medications   Current Outpatient Rx  Name Route Sig Dispense Refill  . CLONAZEPAM 1 MG PO TABS Oral Take 1 mg by mouth 3  (three) times daily.    Marland Kitchen FLUOXETINE HCL 20 MG PO CAPS Oral Take 40 mg by mouth daily.    . METHYLPHENIDATE HCL 20 MG PO TABS Oral Take 10 mg by mouth 3 (three) times daily.      BP 110/56  Pulse 77  Temp(Src) 97.9 F (36.6 C) (Oral)  Resp 18  Ht 5\' 8"  (1.727 m)  Wt 162 lb (73.483 kg)  BMI 24.63 kg/m2  SpO2 100%  Physical Exam  Nursing note and vitals reviewed. Constitutional: He is oriented to person, place, and time. He appears well-developed and well-nourished. No distress.  HENT:  Head: Atraumatic.  Eyes: Pupils are equal, round, and reactive to light.  Neck: Neck supple. No tracheal deviation present.  Cardiovascular: Normal rate.   Pulmonary/Chest: Effort normal. No accessory muscle usage. No respiratory distress.  Abdominal: Soft. Bowel sounds are normal. He exhibits no distension and no mass. There is no tenderness. There is no rebound and no guarding.  Genitourinary:       No cva tenderness. No penile discharge.  No scrotal or testicular tenderness or swelling.   Musculoskeletal: Normal range of motion.  Neurological: He is alert and oriented to person, place, and time.  Skin: Skin is warm and dry. No rash noted.  Psychiatric: He has a normal mood and affect.    ED Course  Procedures (including critical care time)   Labs Reviewed  GC/CHLAMYDIA PROBE AMP, GENITAL  URINALYSIS, ROUTINE W REFLEX MICROSCOPIC    Results for orders placed during the hospital encounter of 07/16/11  URINALYSIS, ROUTINE W REFLEX MICROSCOPIC      Component Value Range   Color, Urine YELLOW  YELLOW    APPearance CLEAR  CLEAR    Specific Gravity, Urine 1.025  1.005 - 1.030    pH 6.0  5.0 - 8.0    Glucose, UA NEGATIVE  NEGATIVE (mg/dL)   Hgb urine dipstick MODERATE (*) NEGATIVE    Bilirubin Urine NEGATIVE  NEGATIVE    Ketones, ur NEGATIVE  NEGATIVE (mg/dL)   Protein, ur NEGATIVE  NEGATIVE (mg/dL)   Urobilinogen, UA 0.2  0.0 - 1.0 (mg/dL)   Nitrite NEGATIVE  NEGATIVE    Leukocytes,  UA NEGATIVE  NEGATIVE   URINE MICROSCOPIC-ADD ON      Component Value Range   Squamous Epithelial / LPF FEW (*) RARE    RBC / HPF 11-20  <3 (RBC/hpf)   Bacteria, UA FEW (*) RARE        MDM  Urethral swab and ua sent.  Pt notes prolonged symptoms x months/yrs, very atypical. With few bact, rbc, will cx urine.  w urethral d/c, gc and chlamydia sent. Will rx.  rec f/u with his urologist.        Suzi Roots, MD 07/16/11 [phone]

## 2011-07-16 NOTE — ED Notes (Addendum)
Patient states he has had a thick white discharge sporadically throughout the day, ongoing for past 5 years, patient states he was diagnosed as having chronic prostititis 5 years ago, patient wants to have a medical record to take to doctors appointment tomorrow.

## 2011-07-16 NOTE — ED Notes (Signed)
In 2008 urologist at [facility] for the penile discharge was disagnosed with BPH and tested for STD which were negative. They prescribed medication but the cost was high and the pt never started on them. The discharge hasn't increased/worsened however the pt is to see family services tomorrow and came here looking for a medical work-up to take with him to his appointment tomorrow. [place], [employer]

## 2011-07-17 LAB — URINE CULTURE: Culture: NO GROWTH

## 2011-07-25 ENCOUNTER — Emergency Department (INDEPENDENT_AMBULATORY_CARE_PROVIDER_SITE_OTHER)
Admission: EM | Admit: 2011-07-25 | Discharge: 2011-07-25 | Disposition: A | Discharge status: Home / Self Care | Payer: Self-pay | Source: Home / Self Care | Attending: Emergency Medicine | Admitting: Emergency Medicine

## 2011-07-25 ENCOUNTER — Encounter (HOSPITAL_COMMUNITY): Payer: Self-pay

## 2011-07-25 DIAGNOSIS — N419 Inflammatory disease of prostate, unspecified: Secondary | ICD-10-CM

## 2011-07-25 MED ORDER — CIPROFLOXACIN HCL 500 MG PO TABS: 500.0000 mg | ORAL_TABLET | Freq: Two times a day (BID) | ORAL | Status: AC

## 2011-07-25 NOTE — ED Notes (Addendum)
Reports he has been having problems w chronic prostatitis for past 5-6 years, and has been having semen dripping from his penis sporadicly over past 5-6 yrs; states he has an appt to see Dr Dartha Lodge on 1-31 for his care issues. C/o that on his visit to [facility], all they did was to give him a shot in his arm , they didn't give him a pain mediation Rx or anything

## 2011-07-25 NOTE — ED Provider Notes (Signed)
History     CSN: [id number]  Arrival date & time 07/25/11  1331   First MD Initiated Contact with Patient 07/25/11 1342      Chief Complaint  Patient presents with  . Prostate Check    (Consider location/radiation/quality/duration/timing/severity/associated sxs/prior treatment) HPI Comments: Patient with 5 years of intermittent clear penile discharge. Reports occasional nonradiating midline lower abdominal pain when having bowel movement. No other abdominal pain. No dysuria, urgency, frequency, hematuria. No trouble starting his urine. No genital rashes, fevers, inguinal lymphadenopathy. Patient has been seen by a urologist in the past, was has been treated with antibiotics, and with an unknown medication for BPH. Patient seen in the ED approximately 9 days ago for the same complaint. Was treated empirically for gonorrhea and chlamydia. Gonorrhea Chlamydia were sent off, which were negative. Was noted to have microscopic hematuria. No organisms seen on microscopy. urinalysis was negative for UTI. Urine culture was negative.  Patient was to follow with his urologist at [facility]. Patient is actually active with male partner of several months. She is not having any symptoms.  ROS as noted in HPI. All other ROS negative.    Past Medical History  Diagnosis Date  . BPH (benign prostatic hyperplasia)   . Anxiety   . Depression   . ADHD (attention deficit hyperactivity disorder)     Past Surgical History  Procedure Date  . Appendectomy 08/2005    Family History  Problem Relation Age of Onset  . Hypertension Father   . Prostate cancer Other     History  Substance Use Topics  . Smoking status: Former Smoker    Quit date: 07/02/2011  . Smokeless tobacco: Current User    Types: Chew  . Alcohol Use: No     quit in 2010      Review of Systems  Allergies  Lexapro  Home Medications   Current Outpatient Rx  Name Route Sig Dispense Refill  . CIPROFLOXACIN HCL 500 MG PO TABS  Oral Take 1 tablet (500 mg total) by mouth 2 (two) times daily. X 14 days 28 tablet 0  . CLONAZEPAM 1 MG PO TABS Oral Take 1 mg by mouth 3 (three) times daily.    Marland Kitchen FLUOXETINE HCL 20 MG PO CAPS Oral Take 40 mg by mouth daily.    . METHYLPHENIDATE HCL 20 MG PO TABS Oral Take 10 mg by mouth 3 (three) times daily.      BP 106/63  Pulse 95  Temp(Src) 97.6 F (36.4 C) (Oral)  Resp 14  SpO2 99%  Physical Exam  Nursing note and vitals reviewed. Constitutional: He is oriented to person, place, and time. He appears well-developed and well-nourished.  HENT:  Head: Normocephalic and atraumatic.  Eyes: EOM are normal.  Neck: Normal range of motion.  Cardiovascular: Normal rate, regular rhythm and normal heart sounds.   Pulmonary/Chest: Effort normal and breath sounds normal.  Abdominal: Soft. Bowel sounds are normal. He exhibits no distension. There is no tenderness. There is no rebound and no guarding.  Genitourinary: Rectum normal, testes normal and penis normal. Prostate is tender. Circumcised. No penile tenderness.       Clear mucoid discharge from penis. No genital rash. Girlfriend present during exam.  Musculoskeletal: Normal range of motion. He exhibits no edema.  Lymphadenopathy:       Right: No inguinal adenopathy present.       Left: No inguinal adenopathy present.  Neurological: He is alert and oriented to person, place, and time.  Skin: Skin is warm and dry. No rash noted.  Psychiatric: He has a normal mood and affect. His behavior is normal. Judgment and thought content normal.    ED Course  Procedures (including critical care time)  Labs Reviewed - No data to display No results found.   1. Prostatitis       MDM  Previous labs, records reviewed. As noted in history of present illness. Was treated for gonorrhea and Chlamydia recently. Urine micro neg for trichomoniasis. Urine culture negative for UTI. Patient has tender prostate, Will treat as prostatitis with Cipro 5  mg by mouth twice a day for 14 days. Discussed labs and plan with patient.  Will refer  to Dr. Isabel Caprice, urologist on call.  Luiz Blare, MD 07/25/11 1719

## 2011-08-16 ENCOUNTER — Encounter (HOSPITAL_COMMUNITY): Payer: Self-pay | Admitting: *Deleted

## 2011-08-16 ENCOUNTER — Emergency Department (HOSPITAL_COMMUNITY)
Admission: EM | Admit: 2011-08-16 | Discharge: 2011-08-16 | Discharge status: Against Medical Advice | Payer: Self-pay | Attending: Emergency Medicine | Admitting: Emergency Medicine

## 2011-08-16 DIAGNOSIS — K089 Disorder of teeth and supporting structures, unspecified: Secondary | ICD-10-CM | POA: Insufficient documentation

## 2011-08-16 DIAGNOSIS — R369 Urethral discharge, unspecified: Secondary | ICD-10-CM | POA: Insufficient documentation

## 2011-08-16 NOTE — ED Notes (Signed)
Teeth and gums have been bothering for a couple of years. Today notes gums bleeding and uncontrollable pain.

## 2011-08-16 NOTE — ED Notes (Signed)
Bed:WTR5<BR> Expected date:<BR> Expected time:<BR> Means of arrival:<BR> Comments:<BR> Terminal clean

## 2011-08-16 NOTE — ED Notes (Signed)
Patient is not in his dedication room.  PA an RN has checked the room x 3 and patient has not been in there.  Called for patient in the main waiting room without any answer.

## 2011-08-18 NOTE — ED Provider Notes (Signed)
Medical screening examination/treatment/procedure(s) were performed by non-physician practitioner and as supervising physician I was immediately available for consultation/collaboration.  Jabar Krysiak P Chella Chapdelaine, MD 08/18/11 0721 

## 2011-09-26 ENCOUNTER — Encounter (HOSPITAL_COMMUNITY): Payer: Self-pay | Admitting: *Deleted

## 2011-09-26 ENCOUNTER — Emergency Department (HOSPITAL_COMMUNITY): Payer: Self-pay

## 2011-09-26 ENCOUNTER — Emergency Department (HOSPITAL_COMMUNITY)
Admission: EM | Admit: 2011-09-26 | Discharge: 2011-09-27 | Disposition: A | Discharge status: Hospice | Payer: Self-pay | Attending: Emergency Medicine | Admitting: Emergency Medicine

## 2011-09-26 DIAGNOSIS — R109 Unspecified abdominal pain: Secondary | ICD-10-CM | POA: Insufficient documentation

## 2011-09-26 DIAGNOSIS — F341 Dysthymic disorder: Secondary | ICD-10-CM | POA: Insufficient documentation

## 2011-09-26 LAB — URINALYSIS, ROUTINE W REFLEX MICROSCOPIC
Bilirubin Urine: NEGATIVE
Specific Gravity, Urine: 1.012 (ref 1.005–1.030)
pH: 6 (ref 5.0–8.0)

## 2011-09-26 LAB — URINE MICROSCOPIC-ADD ON

## 2011-09-26 LAB — CBC
HCT: 42 % (ref 39.0–52.0)
Hemoglobin: 14.2 g/dL (ref 13.0–17.0)
WBC: 6.3 10*3/uL (ref 4.0–10.5)

## 2011-09-26 LAB — BASIC METABOLIC PANEL
BUN: 14 mg/dL (ref 6–23)
CO2: 28 mEq/L (ref 19–32)
Chloride: 99 mEq/L (ref 96–112)
Creatinine, Ser: 0.88 mg/dL (ref 0.50–1.35)

## 2011-09-26 LAB — DIFFERENTIAL
Lymphocytes Relative: 34 % (ref 12–46)
Monocytes Absolute: 0.4 10*3/uL (ref 0.1–1.0)
Monocytes Relative: 7 % (ref 3–12)
Neutro Abs: 3.2 10*3/uL (ref 1.7–7.7)

## 2011-09-26 MED ORDER — SODIUM CHLORIDE 0.9 % IV SOLN
1000.0000 mL | INTRAVENOUS | Status: DC
  Administered 2011-09-26: 1000 mL via INTRAVENOUS

## 2011-09-26 MED ORDER — IOHEXOL 300 MG/ML  SOLN
100.0000 mL | Freq: Once | INTRAMUSCULAR | Status: AC | PRN
  Administered 2011-09-26: 100 mL via INTRAVENOUS

## 2011-09-26 MED ORDER — MORPHINE SULFATE 4 MG/ML IJ SOLN
4.0000 mg | Freq: Once | INTRAMUSCULAR | Status: AC
  Administered 2011-09-26: 4 mg via INTRAVENOUS
  Filled 2011-09-26: qty 1

## 2011-09-26 MED ORDER — ONDANSETRON HCL 4 MG/2ML IJ SOLN
4.0000 mg | Freq: Once | INTRAMUSCULAR | Status: AC
  Administered 2011-09-26: 4 mg via INTRAVENOUS
  Filled 2011-09-26: qty 2

## 2011-09-26 MED ORDER — MORPHINE SULFATE 2 MG/ML IJ SOLN
2.0000 mg | Freq: Once | INTRAMUSCULAR | Status: AC
  Administered 2011-09-26: 2 mg via INTRAVENOUS
  Filled 2011-09-26: qty 1

## 2011-09-26 MED ORDER — HYOSCYAMINE SULFATE 0.125 MG SL SUBL: 0.1250 mg | SUBLINGUAL_TABLET | SUBLINGUAL | Status: DC | PRN

## 2011-09-26 NOTE — ED Provider Notes (Signed)
History     CSN: [id number]  Arrival date & time 09/26/11  1811   First MD Initiated Contact with Patient 09/26/11 2107      Chief Complaint  Patient presents with  . Abdominal Pain    (Consider location/radiation/quality/duration/timing/severity/associated sxs/prior treatment) Patient is a 36 y.o. male presenting with abdominal pain. The history is provided by the patient and medical records.  Abdominal Pain The primary symptoms of the illness include abdominal pain. The primary symptoms of the illness do not include fever, nausea, vomiting or diarrhea.  Symptoms associated with the illness do not include chills or constipation.   the patient is a 36 year old, male, with a history of appendectomy, who presents to the emergency department complaining of lower abdominal pain for the past 2 days.  He denies nausea, vomiting, or diarrhea.  He denies constipation.  He denies dysuria or hematuria.  He has not had a fever, or chills.  He denies use of alcohol  Past Medical History  Diagnosis Date  . BPH (benign prostatic hyperplasia)   . Anxiety   . Depression   . ADHD (attention deficit hyperactivity disorder)     Past Surgical History  Procedure Date  . Appendectomy 08/2005    Family History  Problem Relation Age of Onset  . Hypertension Father   . Prostate cancer Other     History  Substance Use Topics  . Smoking status: Former Smoker    Quit date: 07/02/2011  . Smokeless tobacco: Current User    Types: Chew  . Alcohol Use: No     quit in 2010      Review of Systems  Constitutional: Negative for fever and chills.  Gastrointestinal: Positive for abdominal pain. Negative for nausea, vomiting, diarrhea and constipation.  Neurological: Negative for headaches.  Psychiatric/Behavioral: Negative for confusion.  All other systems reviewed and are negative.    Allergies  Lexapro  Home Medications   Current Outpatient Rx  Name Route Sig Dispense Refill  .  ACETAMINOPHEN-CODEINE #3 300-30 MG PO TABS Oral Take 1 tablet by mouth once.    . ALPRAZOLAM 1 MG PO TABS Oral Take 1 mg by mouth 3 (three) times daily as needed. For anxiety    . AMPHETAMINE-DEXTROAMPHETAMINE 20 MG PO TABS Oral Take 10 mg by mouth 2 (two) times daily.    Marland Kitchen FLUOXETINE HCL 20 MG PO CAPS Oral Take 40 mg by mouth daily.      BP 123/78  Pulse 84  Temp(Src) 98.7 F (37.1 C) (Oral)  Resp 16  Wt 155 lb (70.308 kg)  SpO2 99%  Physical Exam  Vitals reviewed. Constitutional: He is oriented to person, place, and time. He appears well-developed and well-nourished.  HENT:  Head: Normocephalic and atraumatic.  Eyes: EOM are normal.  Neck: Normal range of motion. Neck supple.  Cardiovascular: Normal rate.   No murmur heard. Pulmonary/Chest: Effort normal and breath sounds normal. No respiratory distress.  Abdominal: Soft. He exhibits no distension and no mass. There is tenderness. There is guarding. There is no rebound.       Bilateral lower quadrant tenderness, and suprapubic tenderness, with guarding.  No rebound  Musculoskeletal: Normal range of motion. He exhibits no edema.  Neurological: He is alert and oriented to person, place, and time.  Skin: Skin is warm and dry.  Psychiatric: He has a normal mood and affect. Thought content normal.    ED Course  Procedures (including critical care time) 36 year old, male, with lower bowel pain for  2 days, and no other symptoms.  He's got tenderness across his lower abdomen with guarding.  He has already had his appendix removed.  We will establish an IV perform blood tests, and a CAT scan for further evaluation of his lower abdominal pain, and tenderness  Labs Reviewed  URINALYSIS, ROUTINE W REFLEX MICROSCOPIC - Abnormal; Notable for the following:    Hgb urine dipstick MODERATE (*)    All other components within normal limits  URINE MICROSCOPIC-ADD ON  CBC  DIFFERENTIAL  BASIC METABOLIC PANEL   No results found.   No  diagnosis found.  11:42 PM sxs resolved  MDM  Abdominal pain No acute abdomen        Cheri Guppy, MD 09/26/11 2342

## 2011-09-26 NOTE — ED Notes (Signed)
Pt states "this started to days ago, if feels like when my appendix went bad but I know that's not what it is": pt denies n/v/d

## 2011-09-26 NOTE — Discharge Instructions (Signed)
Your CAT scan, and blood tests do not show any signs of significant illness.  Use Levsin for abdominal pain.  Followup with health services her symptoms.  Last more than 2-3 days.  Return for worse or uncontrolled symptoms

## 2011-09-26 NOTE — ED Notes (Addendum)
While pt receiving Morphine slow IV push he reported feeling lightheaded, injection stopped at pt's request after receiving 2 mg Morphine

## 2011-09-27 ENCOUNTER — Encounter (HOSPITAL_COMMUNITY): Payer: Self-pay | Admitting: Emergency Medicine

## 2011-09-27 ENCOUNTER — Emergency Department (HOSPITAL_COMMUNITY)
Admission: EM | Admit: 2011-09-27 | Discharge: 2011-09-27 | Disposition: A | Discharge status: Home / Self Care | Payer: Self-pay | Attending: Emergency Medicine | Admitting: Emergency Medicine

## 2011-09-27 ENCOUNTER — Emergency Department (HOSPITAL_COMMUNITY): Payer: Self-pay

## 2011-09-27 DIAGNOSIS — N419 Inflammatory disease of prostate, unspecified: Secondary | ICD-10-CM

## 2011-09-27 DIAGNOSIS — F3289 Other specified depressive episodes: Secondary | ICD-10-CM | POA: Insufficient documentation

## 2011-09-27 DIAGNOSIS — F411 Generalized anxiety disorder: Secondary | ICD-10-CM | POA: Insufficient documentation

## 2011-09-27 DIAGNOSIS — F329 Major depressive disorder, single episode, unspecified: Secondary | ICD-10-CM | POA: Insufficient documentation

## 2011-09-27 DIAGNOSIS — R1031 Right lower quadrant pain: Secondary | ICD-10-CM | POA: Insufficient documentation

## 2011-09-27 DIAGNOSIS — R319 Hematuria, unspecified: Secondary | ICD-10-CM | POA: Insufficient documentation

## 2011-09-27 DIAGNOSIS — R10819 Abdominal tenderness, unspecified site: Secondary | ICD-10-CM | POA: Insufficient documentation

## 2011-09-27 LAB — CBC
HCT: 44.5 % (ref 39.0–52.0)
Hemoglobin: 14.9 g/dL (ref 13.0–17.0)
MCH: 31.7 pg (ref 26.0–34.0)
MCHC: 33.5 g/dL (ref 30.0–36.0)
MCV: 94.7 fL (ref 78.0–100.0)
Platelets: 234 K/uL (ref 150–400)
RBC: 4.7 MIL/uL (ref 4.22–5.81)
RDW: 13.5 % (ref 11.5–15.5)
WBC: 7.4 10*3/uL (ref 4.0–10.5)

## 2011-09-27 LAB — URINALYSIS, ROUTINE W REFLEX MICROSCOPIC
Bilirubin Urine: NEGATIVE
Glucose, UA: NEGATIVE mg/dL
Ketones, ur: NEGATIVE mg/dL
Leukocytes, UA: NEGATIVE
Nitrite: NEGATIVE
Protein, ur: NEGATIVE mg/dL
Specific Gravity, Urine: 1.006 (ref 1.005–1.030)
Urobilinogen, UA: 0.2 mg/dL (ref 0.0–1.0)
pH: 6.5 (ref 5.0–8.0)

## 2011-09-27 LAB — DIFFERENTIAL
Basophils Absolute: 0.1 K/uL (ref 0.0–0.1)
Basophils Relative: 1 % (ref 0–1)
Eosinophils Absolute: 0.3 K/uL (ref 0.0–0.7)
Eosinophils Relative: 5 % (ref 0–5)
Lymphocytes Relative: 19 % (ref 12–46)
Lymphs Abs: 1.4 K/uL (ref 0.7–4.0)
Monocytes Absolute: 0.4 10*3/uL (ref 0.1–1.0)
Monocytes Relative: 5 % (ref 3–12)
Neutro Abs: 5.2 10*3/uL (ref 1.7–7.7)
Neutrophils Relative %: 71 % (ref 43–77)

## 2011-09-27 LAB — URINE MICROSCOPIC-ADD ON

## 2011-09-27 MED ORDER — HYDROCODONE-ACETAMINOPHEN 5-325 MG PO TABS: ORAL_TABLET | ORAL | Status: AC

## 2011-09-27 MED ORDER — CIPROFLOXACIN HCL 500 MG PO TABS: 500.0000 mg | ORAL_TABLET | Freq: Two times a day (BID) | ORAL | Status: AC

## 2011-09-27 MED ORDER — HYDROMORPHONE HCL PF 1 MG/ML IJ SOLN
1.0000 mg | Freq: Once | INTRAMUSCULAR | Status: AC
  Administered 2011-09-27: 1 mg via INTRAMUSCULAR
  Filled 2011-09-27: qty 1

## 2011-09-27 NOTE — ED Notes (Signed)
Pt. Statwed, I've been having Abd. Pain for 3 days. i went to [facility] last night. They gave me Levsin and an IV bag. And I'm still in pain.

## 2011-09-27 NOTE — Discharge Instructions (Signed)
Abdominal Pain Abdominal pain can be caused by many things. Your caregiver decides the seriousness of your pain by an examination and possibly blood tests and X-rays. Many cases can be observed and treated at home. Most abdominal pain is not caused by a disease and will probably improve without treatment. However, in many cases, more time must pass before a clear cause of the pain can be found. Before that point, it may not be known if you need more testing, or if hospitalization or surgery is needed. HOME CARE INSTRUCTIONS   Do not take laxatives unless directed by your caregiver.   Take pain medicine only as directed by your caregiver.   Only take over-the-counter or prescription medicines for pain, discomfort, or fever as directed by your caregiver.   Try a clear liquid diet (broth, tea, or water) for as long as directed by your caregiver. Slowly move to a bland diet as tolerated.  SEEK IMMEDIATE MEDICAL CARE IF:   The pain does not go away.   You have a fever.   You keep throwing up (vomiting).   The pain is felt only in portions of the abdomen. Pain in the right side could possibly be appendicitis. In an adult, pain in the left lower portion of the abdomen could be colitis or diverticulitis.   You pass bloody or black tarry stools.  MAKE SURE YOU:   Understand these instructions.   Will watch your condition.   Will get help right away if you are not doing well or get worse.  Document Released: 03/26/2005 Document Revised: 06/05/2011 Document Reviewed: 02/02/2008 [facility] Patient Information 2012 [place], [place].     Prostatitis Prostatitis is an inflammation (the body's way of reacting to injury and/or infection) of the prostate gland. The prostate gland is a male organ. The gland is about the size and shape of a walnut. The prostate is located just below the bladder. It produces semen, which is a fluid that helps nourish and transport sperm. Prostatitis is the most common  urinary tract problem in men younger than age 56. There are 4 categories of prostatitis:  I - Acute bacterial prostatitis.   II - Chronic bacterial prostatitis.   III - Chronic prostatitis and chronic pelvic pain syndrome (CPPS).   Inflammatory.   Non inflammatory.   IV - Asymptomatic inflammatory prostatitis.  Acute and chronic bacterial prostatitis are problems with bacterial infections of the prostate. "Acute" infection is usually a one-time problem. "Chronic" bacterial prostatitis is a condition with recurrent infection. It is usually caused by the same germ(bacteria). CPPS has symptoms similar to prostate infection. However, no infection is actually found. This condition can cause problems of ongoing pain. Currently, it cannot be cured. Treatments are available and aimed at symptom control.  Asymptomatic inflammatory prostatitis has no symptoms. It is a condition where infection-fighting cells are found by chance in the urine. The diagnosis is made most often during an exam for other conditions. Other conditions could be infertility or a high level of PSA (prostate-specific antigen) in the blood. SYMPTOMS  Symptoms can vary depending upon the type of prostatitis that exists. There can also be overlap in symptoms. This can make diagnosis difficult. Symptoms: For Acute bacterial prostatitis  Painful urination.   Fever or chills.   Muscle or joint pains.   Low back pain.   Low abdominal pain.   Inability to empty bladder completely.   Sudden urges to urinate.   Frequent urination during the day.   Difficulty starting  urine stream.   Need to urinate several times at night (nocturia).   Weak urine stream.   Urethral (tube that carries urine from the bladder out of the body) discharge and dribbling after urination.  For Chronic bacterial prostatitis  Rectal pain.   Pain in the testicles, penis, or tip of the penis.   Pain in the space between the anus and scrotum  (perineum).   Low back pain.   Low abdominal pain.   Problems with sexual function.   Painful ejaculation.   Bloody semen.   Inability to empty bladder completely.   Painful urination.   Sudden urges to urinate.   Frequent urination during the day.   Difficulty starting urine stream.   Need to urinate several times at night (nocturia).   Weak urine stream.   Dribbling after urination.   Urethral discharge.  For Chronic prostatitis and chronic pelvic pain syndrome (CPPS) Symptoms are the same as those for chronic bacterial prostatitis. Problems with sexual function are often the reason for seeking care. This important problem should be discussed with your caregiver. For Asymptomatic inflammatory prostatitis As noted above, there are no symptoms with this condition. DIAGNOSIS   Your caregiver may perform a rectal exam. This exam is to determine if the prostate is swollen and tender.   Sometimes blood work is performed. This is done to see if your white blood cell count is elevated. The Prostate Specific Antigen (PSA) is also measured. PSA is a blood test that can help detect early prostate cancer.   A urinalysis is done to find out what type of infection is present if this is a suspected cause. An additional urinalysis may be done after a digital rectal exam. This is to see if white blood cells are pushed out of the prostate and into the urine. A low-grade infection of the prostate may not be found on the first urinalysis.  In more difficult cases, your caregiver may advise other tests. Tests could include:  Urodynamics -- Tests the function of the bladder and the organs involved in triggering and controlling normal urination.   Urine flow rate.   Cystoscopy -- In this procedure, a thin, telescope-like tube with a light and tiny camera attached (cystoscope) is inserted into the bladder through the urethra. This allows the caregiver to see the inside of the urethra and  bladder.   Electromyography -- This procedure tests how the muscles and nerves of the bladder work. It is focused on the muscles that control the anus and pelvic floor. These are the muscles between the anus and scrotum.  In people who show no signs of infection, certain uncommon infections might be causing constant or recurrent symptoms. These uncommon infections are difficult to detect. More work in medicine may help find solutions to these problems. TREATMENT  Antibiotics are used to treat infections caused by germs. If the infection is not treated and becomes long lasting (chronic), it may become a lower grade infection with minor, continual problems. Without treatment, the prostate may develop a boil or furuncle (abscess). This may require surgical treatment. For those with chronic prostatitis and CPPS, it is important to work closely with your primary caregiver and urologist. For some, the medicines that are used to treat a non-cancerous, enlarged prostate (benign prostatic hypertrophy) may be helpful. Referrals to specialists other than urologists may be necessary. In rare cases when all treatments have been inadequate for pain control, an operation to remove the prostate may be recommended. This is  very rare and before this is considered thorough discussion with your urologist is highly recommended.  In cases of secondary to chronic non-bacterial prostatitis, a good relationship with your urologist or primary caregiver is essential because it is often a recurrent prolonged condition that requires a good understanding of the causes and a commitment to therapy aimed at controlling your symptoms. HOME CARE INSTRUCTIONS   Hot sitz baths for 20 minutes, 4 times per day, may help relieve pain.   Non-prescription pain killers may be used as your caregiver recommends if you have no allergies to them. Some illnesses or conditions prevent use of non-prescription drugs. If unsure, check with your  caregiver. Take all medications as directed. Take the antibiotics for the prescribed length of time, even if you are feeling better.  SEEK MEDICAL CARE IF:   You have any worsening of the symptoms that originally brought you to your caregiver.   You have an oral temperature above 102 F (38.9 C).   You experience any side effects from medications prescribed.  SEEK IMMEDIATE MEDICAL CARE IF:   You have an oral temperature above 102 F (38.9 C), not controlled by medicine.   You have pain not relieved with medications.   You develop nausea, vomiting, lightheadedness, or have a fainting episode.   You are unable to urinate.   You pass bloody urine or clots.  Document Released: 06/13/2000 Document Revised: 06/05/2011 Document Reviewed: 05/19/2011 [facility] Patient Information 2012 [place], [place].   Narcotic and benzodiazepine use may cause drowsiness, slowed breathing or dependence.  Please use with caution and do not drive, operate machinery or watch young children alone while taking them.  Taking combinations of these medications or drinking alcohol will potentiate these effects.

## 2011-09-27 NOTE — ED Provider Notes (Signed)
History     CSN: [id number]  Arrival date & time 09/27/11  1400   First MD Initiated Contact with Patient 09/27/11 1522      Chief Complaint  Patient presents with  . Abdominal Pain    (Consider location/radiation/quality/duration/timing/severity/associated sxs/prior treatment) HPI Comments: Patient reports 2 to three-day history of gradually worsening suprapubic and right lower caulk and abdominal discomfort, seen yesterday in the emergency department and had labs, urinalysis and a CT scan performed. No definite findings were found. Patient reports medications were given to him while in the emergency department and that did help. However soon after going home, the pain has come back and it is quite severe. He reports some nausea but no vomiting. No constipation or diarrhea. The patient did have his appendix taken out years ago he reports pain reminds him of that pain. He denies blood in his stool. He reports that he has had chronic prostatitis in the past but this pain today is nothing like his prostatitis pain. He reports when he tries to strain for a bowel movement it does make the pain worse. He also reports coughing seems to make the pain worse with added pressure in his abdominal cavity. He denies back pain, rash. No dysuria or urinary frequency.  Patient is a 36 y.o. male presenting with abdominal pain. The history is provided by the patient and medical records.  Abdominal Pain The primary symptoms of the illness include abdominal pain. The primary symptoms of the illness do not include fever, shortness of breath, nausea, vomiting, diarrhea or dysuria.  Symptoms associated with the illness do not include chills, constipation, urgency, frequency or back pain.    Past Medical History  Diagnosis Date  . BPH (benign prostatic hyperplasia)   . Anxiety   . Depression   . ADHD (attention deficit hyperactivity disorder)     Past Surgical History  Procedure Date  . Appendectomy 08/2005     Family History  Problem Relation Age of Onset  . Hypertension Father   . Prostate cancer Other     History  Substance Use Topics  . Smoking status: Former Smoker    Quit date: 07/02/2011  . Smokeless tobacco: Current User    Types: Chew  . Alcohol Use: No     quit in 2010      Review of Systems  Constitutional: Negative for fever and chills.  Respiratory: Negative for cough and shortness of breath.   Gastrointestinal: Positive for abdominal pain. Negative for nausea, vomiting, diarrhea, constipation and blood in stool.  Genitourinary: Negative for dysuria, urgency, frequency and flank pain.  Musculoskeletal: Negative for back pain.  All other systems reviewed and are negative.    Allergies  Lexapro  Home Medications   Current Outpatient Rx  Name Route Sig Dispense Refill  . ACETAMINOPHEN-CODEINE #3 300-30 MG PO TABS Oral Take 1 tablet by mouth once.    . ALPRAZOLAM 1 MG PO TABS Oral Take 1 mg by mouth 3 (three) times daily as needed. For anxiety    . AMPHETAMINE-DEXTROAMPHETAMINE 20 MG PO TABS Oral Take 10 mg by mouth 2 (two) times daily. Morning and at lunch    . FLUOXETINE HCL 20 MG PO CAPS Oral Take 40 mg by mouth daily.    Marland Kitchen HYOSCYAMINE SULFATE 0.125 MG SL SUBL Sublingual Place 1 tablet (0.125 mg total) under the tongue every 4 (four) hours as needed for cramping. 20 tablet 0  . CIPROFLOXACIN HCL 500 MG PO TABS Oral Take 1  tablet (500 mg total) by mouth 2 (two) times daily. 28 tablet 0  . HYDROCODONE-ACETAMINOPHEN 5-325 MG PO TABS  1-2 tablets po q 6 hours prn moderate to severe pain 20 tablet 0    BP 121/75  Pulse 94  Temp(Src) 98 F (36.7 C) (Oral)  SpO2 100%  Physical Exam  Nursing note and vitals reviewed. Constitutional: He is oriented to person, place, and time. He appears well-developed and well-nourished. No distress.  HENT:  Head: Normocephalic and atraumatic.  Eyes: Pupils are equal, round, and reactive to light. No scleral icterus.    Abdominal: Soft. There is tenderness. There is guarding. There is no rebound. Hernia confirmed negative in the right inguinal area and confirmed negative in the left inguinal area.  Genitourinary: Testes normal and penis normal. Rectal exam shows no external hemorrhoid, no mass and anal tone normal. Prostate is tender. Right testis shows no swelling and no tenderness. Left testis shows no swelling and no tenderness. Circumcised.  Musculoskeletal: He exhibits no edema and no tenderness.  Neurological: He is alert and oriented to person, place, and time.  Skin: Skin is warm.    ED Course  Procedures (including critical care time)  Labs Reviewed  URINALYSIS, ROUTINE W REFLEX MICROSCOPIC - Abnormal; Notable for the following:    Hgb urine dipstick LARGE (*)    All other components within normal limits  CBC  DIFFERENTIAL  URINE MICROSCOPIC-ADD ON  URINE CULTURE   Ct Abdomen Pelvis W Contrast  09/26/2011  *RADIOLOGY REPORT*  Clinical Data: Right lower quadrant pain.  CT ABDOMEN AND PELVIS WITH CONTRAST  Technique:  Multidetector CT imaging of the abdomen and pelvis was performed following the standard protocol during bolus administration of intravenous contrast.  Contrast: OMNIPAQUE IOHEXOL 300 MG/ML IJ SOLN  Comparison: 01/29/2008  Findings: Lung bases are clear.  No effusions.  Heart is normal size.  Liver, gallbladder, spleen, pancreas, adrenals and kidneys are normal.  Prior appendectomy. Bowel grossly unremarkable.  No free fluid, free air, or adenopathy.  Aorta is normal caliber.  Urinary bladder grossly unremarkable.  No acute bony abnormality.  IMPRESSION: No acute findings in the abdomen or pelvis.  Original Report Authenticated By: Cyndie Chime, M.D.   Dg Abd Acute W/chest  09/27/2011  *RADIOLOGY REPORT*  Clinical Data: Abdominal pain  ACUTE ABDOMEN SERIES (ABDOMEN 2 VIEW & CHEST 1 VIEW)  Comparison: 09/26/2011  Findings: Heart size appears normal.  No pleural effusion or edema.   There is no airspace consolidation.  Enteric contrast material from CT dated 09/26/2011 has progressed into the colon.  There are no dilated loops of small bowel or fluid levels identified.  IMPRESSION:  1.  Nonobstructive bowel gas pattern.  There is been interval progression of enteric contrast material from  into the colon. 2.  No active cardiopulmonary abnormalities.  Original Report Authenticated By: Rosealee Albee, M.D.     1. Prostatitis   2. Abdominal pain   3. Hematuria     5:55 PM Pt feels improved after IM dilaudid.  Prostate seems to be tender on exam.  Will treat with PO cipro as he has been successfully treated for prostatitis with cipro in the past.  Will give Rx for analgesic.  UA is still pending.     6:37 PM Hematuria noted on UA.  Culture sent.  Pt will be put on cipro for 2 weeks anyway.  Plain films showed no ureteral stone, no obstruction per radiologist.    MDM  I reviewed the emergency Department note as well as the CT scan results from yesterday. The patient continues to feel very uncomfortable. He is given an IM shot of Dilaudid. Patient has no testicular tenderness, no rash no scrotal swelling. His repeat white count remains normal. No nausea vomiting. His vital signs are otherwise unremarkable. Room air saturation is 100% and normal.        Gavin Pound. Oletta Lamas, MD 09/27/11 [id number]

## 2011-10-16 ENCOUNTER — Emergency Department (HOSPITAL_BASED_OUTPATIENT_CLINIC_OR_DEPARTMENT_OTHER)
Admission: EM | Admit: 2011-10-16 | Discharge: 2011-10-16 | Disposition: A | Discharge status: Home / Self Care | Payer: Self-pay | Attending: Emergency Medicine | Admitting: Emergency Medicine

## 2011-10-16 ENCOUNTER — Encounter (HOSPITAL_BASED_OUTPATIENT_CLINIC_OR_DEPARTMENT_OTHER): Payer: Self-pay | Admitting: *Deleted

## 2011-10-16 DIAGNOSIS — N400 Enlarged prostate without lower urinary tract symptoms: Secondary | ICD-10-CM | POA: Insufficient documentation

## 2011-10-16 DIAGNOSIS — G8929 Other chronic pain: Secondary | ICD-10-CM | POA: Insufficient documentation

## 2011-10-16 DIAGNOSIS — F909 Attention-deficit hyperactivity disorder, unspecified type: Secondary | ICD-10-CM | POA: Insufficient documentation

## 2011-10-16 DIAGNOSIS — Z87891 Personal history of nicotine dependence: Secondary | ICD-10-CM | POA: Insufficient documentation

## 2011-10-16 HISTORY — DX: Unspecified abdominal pain: R10.9

## 2011-10-16 NOTE — ED Provider Notes (Signed)
History     CSN: [id number]  Arrival date & time 10/16/11  0756   First MD Initiated Contact with Patient 10/16/11 0757      Chief Complaint  Patient presents with  . Abdominal Pain    Patient is a 36 y.o. male presenting with abdominal pain. The history is provided by the patient.  Abdominal Pain The primary symptoms of the illness include abdominal pain. The primary symptoms of the illness do not include fever, fatigue, shortness of breath, nausea, vomiting, diarrhea or dysuria. The current episode started more than 2 days ago. The onset of the illness was gradual. The problem has not changed since onset. Additional symptoms associated with the illness include urgency and frequency. Symptoms associated with the illness do not include chills, constipation or back pain.  pt reports bilateral lower abd pain for over 3 yrs.  It is chronic He feels worse with straining for bowel movement He reports urinary frequency but is able to pass urine No fever/vomiting No back pain No focal weakness No urinary/fecal incontinence reported No urinary retention reported He reports recent workup in the ED He also reports seen by urology in [place] and [place] for similar type symptoms over the past several yrs and told he has chronic prostatitis  He is requesting chronic pain management/referral  Past Medical History  Diagnosis Date  . BPH (benign prostatic hyperplasia)   . Anxiety   . Depression   . ADHD (attention deficit hyperactivity disorder)   . Abdominal pain, recurrent     chronic lower abdominal pain    Past Surgical History  Procedure Date  . Appendectomy 08/2005    Family History  Problem Relation Age of Onset  . Hypertension Father   . Prostate cancer Other     History  Substance Use Topics  . Smoking status: Former Smoker    Quit date: 07/02/2011  . Smokeless tobacco: Current User    Types: Chew  . Alcohol Use: No     quit in 2010      Review of Systems    Constitutional: Negative for fever, chills and fatigue.  Respiratory: Negative for shortness of breath.   Gastrointestinal: Positive for abdominal pain. Negative for nausea, vomiting, diarrhea and constipation.  Genitourinary: Positive for urgency and frequency. Negative for dysuria.  Musculoskeletal: Negative for back pain.  All other systems reviewed and are negative.    Allergies  Lexapro  Home Medications   Current Outpatient Rx  Name Route Sig Dispense Refill  . DIAZEPAM 5 MG PO TABS Oral Take 5 mg by mouth every 12 (twelve) hours as needed.    . ACETAMINOPHEN-CODEINE #3 300-30 MG PO TABS Oral Take 1 tablet by mouth once.    . ALPRAZOLAM 1 MG PO TABS Oral Take 1 mg by mouth 3 (three) times daily as needed. For anxiety    . AMPHETAMINE-DEXTROAMPHETAMINE 20 MG PO TABS Oral Take 10 mg by mouth 2 (two) times daily. Morning and at lunch    . FLUOXETINE HCL 20 MG PO CAPS Oral Take 40 mg by mouth daily.    Marland Kitchen HYOSCYAMINE SULFATE 0.125 MG SL SUBL Sublingual Place 1 tablet (0.125 mg total) under the tongue every 4 (four) hours as needed for cramping. 20 tablet 0    BP 97/68  Pulse 76  Temp(Src) 97.7 F (36.5 C) (Oral)  Resp 18  Ht 5\' 7"  (1.702 m)  Wt 152 lb (68.947 kg)  BMI 23.81 kg/m2  SpO2 100%  Physical Exam CONSTITUTIONAL:  Well developed/well nourished HEAD AND FACE: Normocephalic/atraumatic EYES: EOMI/PERRL ENMT: Mucous membranes moist NECK: supple no meningeal signs SPINE:entire spine nontender, No bruising/crepitance/stepoffs noted to spine CV: S1/S2 noted, no murmurs/rubs/gallops noted LUNGS: Lungs are clear to auscultation bilaterally, no apparent distress ABDOMEN: soft, mild tenderness to bilateral lower quadrants.  When distracted no significant tenderness noted. No rebound/guarding, +BS noted GU:no cva tenderness, no testicular tenderness/edema/erythema.  He is circumcised.   Chaperone present.  No hernia noted NEURO: Awake/alert,  equal distal motor: hip  flexion/knee flexion/extension, ankle dorsi/plantar flexion, great toe extension intact bilaterally, no clonus bilaterally,no apparent sensory deficit in any dermatome. Pt is able to ambulate. EXTREMITIES: pulses normal, full ROM SKIN: warm, color normal PSYCH: no abnormalities of mood noted  ED Course  Procedures    1. Chronic pain       MDM  Nursing notes reviewed and considered in documentation Previous records reviewed and considered    Pt with extensive workup in ED including labs/xray/ct scan Had recent diagnosis of prostatitis and given cipro, also had recent pain meds Pt admits this is chronic process, and not acutely worse today I advised him need for chronic pain eval and re-eval by urology priostate exam deferred as just had this recently   The patient appears reasonably screened and/or stabilized for discharge and I doubt any other medical condition or other [facility] requiring further screening, evaluation, or treatment in the ED at this time prior to discharge.     Joya Gaskins, MD 10/16/11 1023

## 2011-10-16 NOTE — Discharge Instructions (Signed)
RESOURCE GUIDE   Chronic Pain Problems Contact [facility]  [phone] Patients need to be referred by their primary care doctor.   No Primary Care Doctor Call [facility]  [phone] Other agencies that provide inexpensive medical care    [facility]  [phone]    [facility]  [phone]    [facility]  [phone]    [facility]  [phone]    [facility]  [phone]    [facility]  [phone]  [facility]  [phone] [facility]  [phone] [facility]   [phone] (emergency services [phone])  [facility]  [phone] [facility] [phone] [facility] [phone] Floydene Flock [phone] [facility]  [phone]       Emergency care providers appreciate that many patients coming to [place] are in severe pain and we wish to address their pain in the safest, most responsible manner.  It is important to recognize however, that the proper treatment of chronic pain differs from that of the pain of injuries and acute illnesses.  Our goal is to provide quality, safe, personalized care and we thank you for giving [place] the opportunity to serve you. The use of narcotics and related agents for chronic pain syndromes may lead to additional physical and psychological problems.  Nearly as many people die from prescription narcotics each year as die from car crashes.  Additionally, this risk is increased if such prescriptions are obtained from a variety of sources.  Therefore, only your primary care physician or a pain management specialist is able to safely treat such syndromes with narcotic medications long-term.      If you have a chronic pain syndrome (i.e. chronic headaches, recurrent back or neck pain, dental pain, abdominal or pelvis pain without a specific  diagnosis, or neuropathic pain such as fibromyalgia) or recurrent visits for the same condition without an acute diagnosis, you may be treated with non-narcotics and other non-addictive medicines.  Allergic reactions or negative side effects that may be reported by a patient to such medications will not typically lead to the use of a narcotic analgesic or other controlled substance as an alternative.   Patients managing chronic pain with a personal physician should have provisions in place for breakthrough pain.  If you are in crisis, you should call your physician.  If your physician directs you to the emergency department, please have the doctor call and speak to our attending physician concerning your care.   When patients come to the Emergency Department (ED) with acute medical conditions in which the Emergency Department physician feels appropriate to prescribe narcotic or sedating pain medication, the physician will prescribe these in very limited quantities.  The amount of these medications will last only until you can see your primary care physician in his/her office.  Any patient who returns to the ED seeking refills should expect only non-narcotic pain medications.     Prescriptions for narcotic or sedating medications that have been lost, stolen or expired will not be refilled in the Emergency Department.      Chronic Pain Management Managing chronic pain is not easy. The goal is to provide as much pain relief as possible. There are emotional as well as physical problems. Chronic pain may lead to symptoms of depression which magnify those of the pain. Problems may include:  Anxiety.   Sleep disturbances.  Confused thinking.   Feeling cranky.   Fatigue.   Weight gain or loss.  Identify the source of the pain first, if possible. The pain may be masking another problem. Try to find a pain management specialist or clinic. Work with a team to create a treatment plan for  you. MEDICATIONS  May include narcotics or opioids. Larger than normal doses may be needed to control your pain.   Drugs for depression may help.   Over-the-counter medicines may help for some conditions. These drugs may be used along with others for better pain relief.   May be injected into sites such as the spine and joints. Injections may have to be repeated if they wear off.  THERAPY MAY INCLUDE:  Working with a physical therapist to keep from getting stiff.   Regular, gentle exercise.   Cognitive or behavioral therapy.   Using complementary or integrative medicine such as:   Acupuncture.   Massage, Reiki, or Rolfing.   Aroma, color, light, or sound therapy.   Group support.  FOR MORE INFORMATION [email or web address]. American Chronic Pain Association [email or web address]. Document Released: 07/24/2004 Document Revised: 06/05/2011 Document Reviewed: 09/02/2007 [facility] Patient Information 2012 [place], [place].

## 2011-10-16 NOTE — ED Notes (Signed)
Patient states he has had pain in his lower abdomen for the last 3 years.  Was in the hospital last week for the same.  States he continues to have pain, no changes today.  States he takes valium for pain which is not helping with his pain.

## 2011-10-16 NOTE — ED Notes (Signed)
MD at bedside. 

## 2011-10-20 ENCOUNTER — Encounter (HOSPITAL_COMMUNITY): Payer: Self-pay

## 2011-10-20 ENCOUNTER — Emergency Department (HOSPITAL_COMMUNITY)
Admission: EM | Admit: 2011-10-20 | Discharge: 2011-10-21 | Disposition: A | Discharge status: Home / Self Care | Payer: Self-pay | Attending: Emergency Medicine | Admitting: Emergency Medicine

## 2011-10-20 DIAGNOSIS — R10813 Right lower quadrant abdominal tenderness: Secondary | ICD-10-CM | POA: Insufficient documentation

## 2011-10-20 DIAGNOSIS — K625 Hemorrhage of anus and rectum: Secondary | ICD-10-CM | POA: Insufficient documentation

## 2011-10-20 DIAGNOSIS — R1032 Left lower quadrant pain: Secondary | ICD-10-CM | POA: Insufficient documentation

## 2011-10-20 DIAGNOSIS — R319 Hematuria, unspecified: Secondary | ICD-10-CM | POA: Insufficient documentation

## 2011-10-20 DIAGNOSIS — R1031 Right lower quadrant pain: Secondary | ICD-10-CM | POA: Insufficient documentation

## 2011-10-20 DIAGNOSIS — G8929 Other chronic pain: Secondary | ICD-10-CM

## 2011-10-20 DIAGNOSIS — K921 Melena: Secondary | ICD-10-CM | POA: Insufficient documentation

## 2011-10-20 DIAGNOSIS — R10814 Left lower quadrant abdominal tenderness: Secondary | ICD-10-CM | POA: Insufficient documentation

## 2011-10-20 DIAGNOSIS — R110 Nausea: Secondary | ICD-10-CM | POA: Insufficient documentation

## 2011-10-20 MED ORDER — HYDROMORPHONE HCL PF 1 MG/ML IJ SOLN
1.0000 mg | Freq: Once | INTRAMUSCULAR | Status: AC
  Administered 2011-10-21: 1 mg via INTRAVENOUS
  Filled 2011-10-20: qty 1

## 2011-10-20 MED ORDER — ONDANSETRON HCL 4 MG/2ML IJ SOLN
4.0000 mg | Freq: Once | INTRAMUSCULAR | Status: AC
  Administered 2011-10-21: 4 mg via INTRAVENOUS
  Filled 2011-10-20: qty 2

## 2011-10-20 NOTE — ED Provider Notes (Addendum)
History     CSN: [id number]  Arrival date & time 10/20/11  2259   First MD Initiated Contact with Patient 10/20/11 2332      Chief Complaint  Patient presents with  . Rectal Bleeding    (Consider location/radiation/quality/duration/timing/severity/associated sxs/prior treatment) Patient is a 36 y.o. male presenting with hematochezia and abdominal pain. The history is provided by the patient. No language interpreter was used.  Rectal Bleeding  The current episode started today. The onset was sudden. The problem occurs rarely. The problem has been resolved (One episode in the commode). The pain is severe. There was no prior successful therapy. There was no prior unsuccessful therapy. Associated symptoms include abdominal pain, nausea and hematuria. Associated symptoms comments: Has prostate issues and has had hematuria in the past.  Has chronic BLQ pain.  Marland Kitchen He has been behaving normally. He has been eating and drinking normally. Urine output has been normal. There were no sick contacts. Recently, medical care has been given at another facility. Services received include medications given and tests performed.  Abdominal Pain The primary symptoms of the illness include abdominal pain, nausea and hematochezia. The current episode started 6 to 12 hours ago. The onset of the illness was sudden. The problem has not changed since onset. The abdominal pain began 6 to 12 hours ago. The pain came on suddenly. The abdominal pain has been unchanged since its onset. The abdominal pain is located in the RLQ and LLQ. The abdominal pain does not radiate. The severity of the abdominal pain is 10/10. The abdominal pain is relieved by nothing. Exacerbated by: nothing.  Associated with: none. Risk factors for an acute abdominal problem include a history of abdominal surgery. Additional symptoms associated with the illness include hematuria.    Past Medical History  Diagnosis Date  . BPH (benign prostatic  hyperplasia)   . Anxiety   . Depression   . ADHD (attention deficit hyperactivity disorder)   . Abdominal pain, recurrent     chronic lower abdominal pain    Past Surgical History  Procedure Date  . Appendectomy 08/2005    Family History  Problem Relation Age of Onset  . Hypertension Father   . Prostate cancer Other     History  Substance Use Topics  . Smoking status: Former Smoker    Quit date: 07/02/2011  . Smokeless tobacco: Current User    Types: Chew  . Alcohol Use: No     quit in 2010      Review of Systems  Gastrointestinal: Positive for nausea, abdominal pain, blood in stool and hematochezia.  Genitourinary: Positive for hematuria.  All other systems reviewed and are negative.    Allergies  Lexapro  Home Medications   Current Outpatient Rx  Name Route Sig Dispense Refill  . ACETAMINOPHEN-CODEINE #3 300-30 MG PO TABS Oral Take 1 tablet by mouth once.    . ALPRAZOLAM 1 MG PO TABS Oral Take 1 mg by mouth 3 (three) times daily as needed. For anxiety    . AMPHETAMINE-DEXTROAMPHETAMINE 20 MG PO TABS Oral Take 10 mg by mouth 2 (two) times daily. Morning and at lunch    . DIAZEPAM 5 MG PO TABS Oral Take 5 mg by mouth every 12 (twelve) hours as needed.    Marland Kitchen FLUOXETINE HCL 20 MG PO CAPS Oral Take 40 mg by mouth daily.      BP 105/60  Pulse 104  Temp(Src) 98.1 F (36.7 C) (Oral)  Resp 18  SpO2 100%  Physical Exam  Constitutional: He is oriented to person, place, and time. He appears well-developed and well-nourished.  HENT:  Head: Normocephalic and atraumatic.  Mouth/Throat: Oropharynx is clear and moist.  Eyes: Conjunctivae are normal. Pupils are equal, round, and reactive to light.  Neck: Normal range of motion. Neck supple.  Cardiovascular: Normal rate and regular rhythm.   Pulmonary/Chest: Effort normal and breath sounds normal. No respiratory distress. He has no wheezes.  Abdominal: Soft. Bowel sounds are normal. There is tenderness in the right  lower quadrant and left lower quadrant.  Genitourinary: Guaiac negative stool.       Internal hemorrhoid, chaperone present  Musculoskeletal: Normal range of motion. He exhibits no edema.  Neurological: He is alert and oriented to person, place, and time.  Skin: Skin is warm and dry.  Psychiatric: Thought content normal.    ED Course  Procedures (including critical care time)   Labs Reviewed  CBC  DIFFERENTIAL  URINALYSIS, ROUTINE W REFLEX MICROSCOPIC   No results found.   No diagnosis found.    MDM  Chronic abdominal pain with stable vitals.  Recent normal CT of the abdomen without diverticular disease seen.  Patient informed to follow up GI for colonoscopy and to return immediately for recurrent bleeding worsening pain fevers chills or any concerns.  Does not feel his pain is different different from his norm and he does not feel he needs CT at this time.  Patient agrees to follow up and will return for worsening symptoms    Patient with active narcotic prescriptions will not provide RX.  Follow up with previous prescribing provider for RX    Ura Yingling K Karron Goens-Rasch, MD 10/21/11 0246  Gemayel Mascio Smitty Cords, MD 10/21/11 [phone]

## 2011-10-20 NOTE — ED Notes (Signed)
Pt was dx with interstital cystitis on the 12th at [facility], tonight he started having severe abdominal pain and had bright red rectal bleeding when he used the bathroom

## 2011-10-21 ENCOUNTER — Emergency Department (HOSPITAL_COMMUNITY): Payer: Self-pay

## 2011-10-21 LAB — DIFFERENTIAL
Basophils Absolute: 0 10*3/uL (ref 0.0–0.1)
Basophils Relative: 0 % (ref 0–1)
Lymphocytes Relative: 14 % (ref 12–46)
Neutro Abs: 11 10*3/uL — ABNORMAL HIGH (ref 1.7–7.7)
Neutrophils Relative %: 77 % (ref 43–77)

## 2011-10-21 LAB — URINE MICROSCOPIC-ADD ON

## 2011-10-21 LAB — CBC
HCT: 47.1 % (ref 39.0–52.0)
Hemoglobin: 15.8 g/dL (ref 13.0–17.0)
MCHC: 33.5 g/dL (ref 30.0–36.0)
RDW: 13.3 % (ref 11.5–15.5)
WBC: 14.3 10*3/uL — ABNORMAL HIGH (ref 4.0–10.5)

## 2011-10-21 LAB — POCT I-STAT, CHEM 8
HCT: 50 % (ref 39.0–52.0)
Hemoglobin: 17 g/dL (ref 13.0–17.0)
Potassium: 3.9 mEq/L (ref 3.5–5.1)
Sodium: 141 mEq/L (ref 135–145)
TCO2: 26 mmol/L (ref 0–100)

## 2011-10-21 LAB — URINALYSIS, ROUTINE W REFLEX MICROSCOPIC
Leukocytes, UA: NEGATIVE
Nitrite: NEGATIVE
Specific Gravity, Urine: 1.021 (ref 1.005–1.030)
Urobilinogen, UA: 1 mg/dL (ref 0.0–1.0)
pH: 7.5 (ref 5.0–8.0)

## 2011-10-21 LAB — OCCULT BLOOD, POC DEVICE: Fecal Occult Bld: NEGATIVE

## 2011-10-21 MED ORDER — HYDROMORPHONE HCL PF 1 MG/ML IJ SOLN
0.5000 mg | Freq: Once | INTRAMUSCULAR | Status: DC
  Filled 2011-10-21: qty 1

## 2011-10-21 MED ORDER — HYDROMORPHONE HCL PF 1 MG/ML IJ SOLN
0.5000 mg | Freq: Once | INTRAMUSCULAR | Status: AC
  Administered 2011-10-21: 0.5 mg via INTRAVENOUS

## 2011-10-21 NOTE — Discharge Instructions (Signed)
Abdominal Pain Abdominal pain can be caused by many things. Your caregiver decides the seriousness of your pain by an examination and possibly blood tests and X-rays. Many cases can be observed and treated at home. Most abdominal pain is not caused by a disease and will probably improve without treatment. However, in many cases, more time must pass before a clear cause of the pain can be found. Before that point, it may not be known if you need more testing, or if hospitalization or surgery is needed. HOME CARE INSTRUCTIONS   Do not take laxatives unless directed by your caregiver.   Take pain medicine only as directed by your caregiver.   Only take over-the-counter or prescription medicines for pain, discomfort, or fever as directed by your caregiver.   Try a clear liquid diet (broth, tea, or water) for as long as directed by your caregiver. Slowly move to a bland diet as tolerated.  SEEK IMMEDIATE MEDICAL CARE IF:   The pain does not go away.   You have a fever.   You keep throwing up (vomiting).   The pain is felt only in portions of the abdomen. Pain in the right side could possibly be appendicitis. In an adult, pain in the left lower portion of the abdomen could be colitis or diverticulitis.   You pass bloody or black tarry stools.  MAKE SURE YOU:   Understand these instructions.   Will watch your condition.   Will get help right away if you are not doing well or get worse.  Document Released: 03/26/2005 Document Revised: 06/05/2011 Document Reviewed: 02/02/2008 [employer] Patient Information 2012 [employer].Abdominal Pain Abdominal pain can be caused by many things. Your caregiver decides the seriousness of your pain by an examination and possibly blood tests and X-rays. Many cases can be observed and treated at home. Most abdominal pain is not caused by a disease and will probably improve without treatment. However, in many cases, more time must pass before a clear cause of  the pain can be found. Before that point, it may not be known if you need more testing, or if hospitalization or surgery is needed. HOME CARE INSTRUCTIONS   Do not take laxatives unless directed by your caregiver.   Take pain medicine only as directed by your caregiver.   Only take over-the-counter or prescription medicines for pain, discomfort, or fever as directed by your caregiver.   Try a clear liquid diet (broth, tea, or water) for as long as directed by your caregiver. Slowly move to a bland diet as tolerated.  SEEK IMMEDIATE MEDICAL CARE IF:   The pain does not go away.   You have a fever.   You keep throwing up (vomiting).   The pain is felt only in portions of the abdomen. Pain in the right side could possibly be appendicitis. In an adult, pain in the left lower portion of the abdomen could be colitis or diverticulitis.   You pass bloody or black tarry stools.  MAKE SURE YOU:   Understand these instructions.   Will watch your condition.   Will get help right away if you are not doing well or get worse.  Document Released: 03/26/2005 Document Revised: 06/05/2011 Document Reviewed: 02/02/2008 [employer] Patient Information 2012 [employer]. 

## 2011-11-17 ENCOUNTER — Emergency Department (HOSPITAL_COMMUNITY)
Admission: EM | Admit: 2011-11-17 | Discharge: 2011-11-17 | Discharge status: Against Medical Advice | Payer: Self-pay | Attending: Emergency Medicine | Admitting: Emergency Medicine

## 2011-11-17 ENCOUNTER — Emergency Department (HOSPITAL_COMMUNITY)
Admission: EM | Admit: 2011-11-17 | Discharge: 2011-11-18 | Disposition: A | Discharge status: Home / Self Care | Payer: Self-pay | Attending: Emergency Medicine | Admitting: Emergency Medicine

## 2011-11-17 ENCOUNTER — Encounter (HOSPITAL_COMMUNITY): Payer: Self-pay | Admitting: Emergency Medicine

## 2011-11-17 DIAGNOSIS — Z79899 Other long term (current) drug therapy: Secondary | ICD-10-CM | POA: Insufficient documentation

## 2011-11-17 DIAGNOSIS — R110 Nausea: Secondary | ICD-10-CM | POA: Insufficient documentation

## 2011-11-17 DIAGNOSIS — F341 Dysthymic disorder: Secondary | ICD-10-CM | POA: Insufficient documentation

## 2011-11-17 DIAGNOSIS — F1120 Opioid dependence, uncomplicated: Secondary | ICD-10-CM | POA: Insufficient documentation

## 2011-11-17 DIAGNOSIS — F909 Attention-deficit hyperactivity disorder, unspecified type: Secondary | ICD-10-CM | POA: Insufficient documentation

## 2011-11-17 DIAGNOSIS — Z0389 Encounter for observation for other suspected diseases and conditions ruled out: Secondary | ICD-10-CM | POA: Insufficient documentation

## 2011-11-17 LAB — URINALYSIS, ROUTINE W REFLEX MICROSCOPIC
Glucose, UA: NEGATIVE mg/dL
Hgb urine dipstick: NEGATIVE
Protein, ur: NEGATIVE mg/dL
Specific Gravity, Urine: 1.016 (ref 1.005–1.030)

## 2011-11-17 LAB — URINE MICROSCOPIC-ADD ON

## 2011-11-17 LAB — CBC
MCH: 32 pg (ref 26.0–34.0)
MCHC: 33.6 g/dL (ref 30.0–36.0)
MCV: 95.1 fL (ref 78.0–100.0)
Platelets: 217 10*3/uL (ref 150–400)
RBC: 4.5 MIL/uL (ref 4.22–5.81)
RDW: 13.5 % (ref 11.5–15.5)

## 2011-11-17 LAB — RAPID URINE DRUG SCREEN, HOSP PERFORMED
Cocaine: NOT DETECTED
Opiates: POSITIVE — AB

## 2011-11-17 LAB — BASIC METABOLIC PANEL
Calcium: 8.9 mg/dL (ref 8.4–10.5)
Creatinine, Ser: 0.76 mg/dL (ref 0.50–1.35)
GFR calc Af Amer: >90 mL/min (ref >90)
GFR calc non Af Amer: >90 mL/min (ref >90)
Sodium: 137 mEq/L (ref 135–145)

## 2011-11-17 MED ORDER — HYDROXYZINE HCL 25 MG PO TABS
25.0000 mg | ORAL_TABLET | Freq: Four times a day (QID) | ORAL | Status: DC | PRN
  Administered 2011-11-18: 25 mg via ORAL
  Filled 2011-11-17: qty 1

## 2011-11-17 MED ORDER — LOPERAMIDE HCL 2 MG PO CAPS
2.0000 mg | ORAL_CAPSULE | ORAL | Status: DC | PRN
  Administered 2011-11-18: 4 mg via ORAL
  Filled 2011-11-17: qty 2

## 2011-11-17 MED ORDER — METHOCARBAMOL 500 MG PO TABS
500.0000 mg | ORAL_TABLET | Freq: Three times a day (TID) | ORAL | Status: DC | PRN
  Filled 2011-11-17: qty 1

## 2011-11-17 MED ORDER — CLONIDINE HCL 0.1 MG PO TABS: 0.1000 mg | ORAL_TABLET | Freq: Every day | ORAL | Status: DC

## 2011-11-17 MED ORDER — IBUPROFEN 600 MG PO TABS: 600.0000 mg | ORAL_TABLET | Freq: Three times a day (TID) | ORAL | Status: DC | PRN

## 2011-11-17 MED ORDER — ONDANSETRON 4 MG PO TBDP: 4.0000 mg | ORAL_TABLET | Freq: Four times a day (QID) | ORAL | Status: DC | PRN

## 2011-11-17 MED ORDER — CLONIDINE HCL 0.1 MG PO TABS: 0.1000 mg | ORAL_TABLET | ORAL | Status: DC

## 2011-11-17 MED ORDER — LORAZEPAM 1 MG PO TABS
1.0000 mg | ORAL_TABLET | Freq: Three times a day (TID) | ORAL | Status: DC | PRN
  Administered 2011-11-18: 1 mg via ORAL
  Filled 2011-11-17: qty 1

## 2011-11-17 MED ORDER — DICYCLOMINE HCL 20 MG PO TABS: 20.0000 mg | ORAL_TABLET | Freq: Four times a day (QID) | ORAL | Status: DC | PRN

## 2011-11-17 MED ORDER — NICOTINE 21 MG/24HR TD PT24: 21.0000 mg | MEDICATED_PATCH | Freq: Every day | TRANSDERMAL | Status: DC

## 2011-11-17 MED ORDER — CLONIDINE HCL 0.1 MG PO TABS
0.1000 mg | ORAL_TABLET | Freq: Four times a day (QID) | ORAL | Status: DC
  Filled 2011-11-17: qty 1

## 2011-11-17 MED ORDER — NAPROXEN 500 MG PO TABS: 500.0000 mg | ORAL_TABLET | Freq: Two times a day (BID) | ORAL | Status: DC | PRN

## 2011-11-17 MED ORDER — ACETAMINOPHEN 325 MG PO TABS: 650.0000 mg | ORAL_TABLET | ORAL | Status: DC | PRN

## 2011-11-17 NOTE — ED Provider Notes (Signed)
History     CSN: [id number]  Arrival date & time 11/17/11  1914   First MD Initiated Contact with Patient 11/17/11 2330      Chief Complaint  Patient presents with  . Medical Clearance    (Consider location/radiation/quality/duration/timing/severity/associated sxs/prior treatment) HPI Comments: Patient presents for opiate detox. Patient states that he takes between 100-200 mg of oxycodone daily. His last use was 6 hours ago. He is requesting detox. He has been at [facility] in the past. No current medical concerns other than nausea which he relates to withdrawals. Nothing makes the current symptoms better or worse. Nausea is constant. No SI/HI.   Patient is a 35 y.o. male presenting with drug problem. The history is provided by the patient.  Drug Problem This is a chronic problem. Associated symptoms include nausea. Pertinent negatives include no abdominal pain, chest pain, coughing, fever, headaches, myalgias, rash, sore throat or vomiting.    Past Medical History  Diagnosis Date  . BPH (benign prostatic hyperplasia)   . Anxiety   . Depression   . ADHD (attention deficit hyperactivity disorder)   . Abdominal pain, recurrent     chronic lower abdominal pain    Past Surgical History  Procedure Date  . Appendectomy 08/2005    Family History  Problem Relation Age of Onset  . Hypertension Father   . Prostate cancer Other     History  Substance Use Topics  . Smoking status: Former Smoker    Quit date: 07/02/2011  . Smokeless tobacco: Current User    Types: Chew  . Alcohol Use: No     quit in 2010      Review of Systems  Constitutional: Negative for fever.  HENT: Negative for sore throat and rhinorrhea.   Eyes: Negative for redness.  Respiratory: Negative for cough.   Cardiovascular: Negative for chest pain.  Gastrointestinal: Positive for nausea. Negative for vomiting, abdominal pain and diarrhea.  Genitourinary: Negative for dysuria.  Musculoskeletal: Negative for  myalgias.  Skin: Negative for rash.  Neurological: Negative for headaches.    Allergies  Escitalopram oxalate  Home Medications   Current Outpatient Rx  Name Route Sig Dispense Refill  . AMPHETAMINE-DEXTROAMPHETAMINE 20 MG PO TABS Oral Take 20 mg by mouth daily. Morning and at lunch    . DIAZEPAM 5 MG PO TABS Oral Take 5 mg by mouth every 12 (twelve) hours as needed. For bladder spasms.    . FLUOXETINE HCL 20 MG PO CAPS Oral Take 40 mg by mouth daily.    . OXYCODONE-ACETAMINOPHEN 10-325 MG PO TABS Oral Take 3-4 tablets by mouth every 4 (four) hours as needed. For pain.      BP 113/71  Pulse 88  Temp(Src) 97.9 F (36.6 C) (Oral)  Resp 18  SpO2 100%  Physical Exam  Nursing note and vitals reviewed. Constitutional: He appears well-developed and well-nourished.  HENT:  Head: Normocephalic and atraumatic.  Eyes: Conjunctivae are normal. Right eye exhibits no discharge. Left eye exhibits no discharge.  Neck: Normal range of motion. Neck supple.  Cardiovascular: Normal rate, regular rhythm and normal heart sounds.   Pulmonary/Chest: Effort normal and breath sounds normal.  Abdominal: Soft. There is no tenderness.  Neurological: He is alert.  Skin: Skin is warm and dry.  Psychiatric: He has a normal mood and affect.    ED Course  Procedures (including critical care time)  Labs Reviewed  BASIC METABOLIC PANEL - Abnormal; Notable for the following:    Potassium 3.4 (*)  Glucose, Bld 110 (*)    All other components within normal limits  URINE RAPID DRUG SCREEN (HOSP PERFORMED) - Abnormal; Notable for the following:    Opiates POSITIVE (*)    Amphetamines POSITIVE (*)    All other components within normal limits  URINALYSIS, ROUTINE W REFLEX MICROSCOPIC - Abnormal; Notable for the following:    Leukocytes, UA SMALL (*)    All other components within normal limits  CBC  ETHANOL  URINE MICROSCOPIC-ADD ON  LAB REPORT - SCANNED   No results found.   1. Opiate  addiction     11:43 PM Patient seen and examined. Work-up initiated. Opiate detox protocol ordered. Holding orders written.   Vital signs reviewed and are as follows: Filed Vitals:   11/17/11 2239  BP: 113/71  Pulse: 88  Temp: 97.9 F (36.6 C)  Resp: 18    1:17 AM ACT has seen patient.   MDM  Evaluation for inpatient detox form opiates        Renne Crigler, [place] 11/19/11 2025

## 2011-11-17 NOTE — ED Notes (Signed)
Talked with patient- wants to detox from opiates, takes pills, will follow up with regular dr and wants to leave. Here willingly.

## 2011-11-17 NOTE — ED Notes (Signed)
Pt states he is here for detox from opiates  Pt states here volutarily  Denies SI/HI  Pt states he last used about 6 hrs ago  Pt states he has been using for about 6 yrs

## 2011-11-18 ENCOUNTER — Emergency Department (HOSPITAL_COMMUNITY)
Admission: EM | Admit: 2011-11-18 | Discharge: 2011-11-19 | Disposition: A | Discharge status: Home / Self Care | Payer: Self-pay | Attending: Emergency Medicine | Admitting: Emergency Medicine

## 2011-11-18 ENCOUNTER — Encounter (HOSPITAL_COMMUNITY): Payer: Self-pay | Admitting: Physical Medicine and Rehabilitation

## 2011-11-18 ENCOUNTER — Encounter (HOSPITAL_COMMUNITY): Payer: Self-pay | Admitting: *Deleted

## 2011-11-18 DIAGNOSIS — F909 Attention-deficit hyperactivity disorder, unspecified type: Secondary | ICD-10-CM | POA: Insufficient documentation

## 2011-11-18 DIAGNOSIS — R197 Diarrhea, unspecified: Secondary | ICD-10-CM | POA: Insufficient documentation

## 2011-11-18 DIAGNOSIS — F341 Dysthymic disorder: Secondary | ICD-10-CM | POA: Insufficient documentation

## 2011-11-18 DIAGNOSIS — R000 Tachycardia, unspecified: Secondary | ICD-10-CM | POA: Insufficient documentation

## 2011-11-18 DIAGNOSIS — F1110 Opioid abuse, uncomplicated: Secondary | ICD-10-CM | POA: Insufficient documentation

## 2011-11-18 DIAGNOSIS — Z79899 Other long term (current) drug therapy: Secondary | ICD-10-CM | POA: Insufficient documentation

## 2011-11-18 LAB — COMPREHENSIVE METABOLIC PANEL
Albumin: 4.5 g/dL (ref 3.5–5.2)
Alkaline Phosphatase: 53 U/L (ref 39–117)
BUN: 11 mg/dL (ref 6–23)
Potassium: 4 mEq/L (ref 3.5–5.1)
Sodium: 138 mEq/L (ref 135–145)
Total Protein: 7.4 g/dL (ref 6.0–8.3)

## 2011-11-18 LAB — ETHANOL: Alcohol, Ethyl (B): <11 mg/dL (ref 0–11)

## 2011-11-18 LAB — CBC
HCT: 42.6 % (ref 39.0–52.0)
MCHC: 34 g/dL (ref 30.0–36.0)
Platelets: 235 10*3/uL (ref 150–400)
RDW: 13.5 % (ref 11.5–15.5)

## 2011-11-18 LAB — RAPID URINE DRUG SCREEN, HOSP PERFORMED
Amphetamines: POSITIVE — AB
Benzodiazepines: NOT DETECTED

## 2011-11-18 MED ORDER — CLONIDINE HCL 0.1 MG PO TABS: 0.1000 mg | ORAL_TABLET | ORAL | Status: DC

## 2011-11-18 MED ORDER — CLONIDINE HCL 0.1 MG PO TABS: 0.1000 mg | ORAL_TABLET | Freq: Four times a day (QID) | ORAL | Status: DC

## 2011-11-18 MED ORDER — CLONIDINE HCL 0.1 MG PO TABS: 0.1000 mg | ORAL_TABLET | Freq: Every day | ORAL | Status: DC

## 2011-11-18 MED ORDER — LOPERAMIDE HCL 2 MG PO CAPS: 2.0000 mg | ORAL_CAPSULE | ORAL | Status: DC | PRN

## 2011-11-18 MED ORDER — METHOCARBAMOL 500 MG PO TABS: 500.0000 mg | ORAL_TABLET | Freq: Three times a day (TID) | ORAL | Status: DC | PRN

## 2011-11-18 MED ORDER — CLONIDINE HCL 0.1 MG PO TABS
0.3000 mg | ORAL_TABLET | ORAL | Status: AC
  Administered 2011-11-18: 0.3 mg via ORAL
  Filled 2011-11-18: qty 3

## 2011-11-18 MED ORDER — POTASSIUM CHLORIDE CRYS ER 20 MEQ PO TBCR
40.0000 meq | EXTENDED_RELEASE_TABLET | Freq: Once | ORAL | Status: AC
  Administered 2011-11-18: 40 meq via ORAL
  Filled 2011-11-18: qty 2

## 2011-11-18 MED ORDER — HYDROXYZINE HCL 25 MG PO TABS: 25.0000 mg | ORAL_TABLET | Freq: Four times a day (QID) | ORAL | Status: DC | PRN

## 2011-11-18 MED ORDER — NAPROXEN 500 MG PO TABS
500.0000 mg | ORAL_TABLET | Freq: Two times a day (BID) | ORAL | Status: DC | PRN
  Filled 2011-11-18: qty 1

## 2011-11-18 MED ORDER — DICYCLOMINE HCL 20 MG PO TABS
20.0000 mg | ORAL_TABLET | Freq: Four times a day (QID) | ORAL | Status: DC | PRN
  Filled 2011-11-18: qty 1

## 2011-11-18 MED ORDER — ONDANSETRON 4 MG PO TBDP: 4.0000 mg | ORAL_TABLET | Freq: Four times a day (QID) | ORAL | Status: DC | PRN

## 2011-11-18 NOTE — ED Notes (Signed)
Pt wants to discharge home. Has refused [facility] after being accepted because MD wont order adderal, ritalin, and prozac while he is here even though he is aware that he wouldn't get it while he was at [facility] either way. Pt is cleared for discharge by Emergency Rm MD.

## 2011-11-18 NOTE — ED Notes (Signed)
Pt presents to department for evaluation of detox from opiates. States he went to [facility] last night but had issues with his medications. Now requesting detox. States he takes 20+ tablets of Percocet 10mg /325 a day. States he has chronic pain issues but wants to get some help. Denies SI/HI. Last use of opiates today. He is alert and oriented x4. 5/10 lower back pain at the time.

## 2011-11-18 NOTE — ED Notes (Signed)
Pt presented to the ER requesting help for detox from narcotic pain medications. Pt stated that his last dose of narcotic pain medication was 2 hours ago. He stated he took 4 tablets of 10-325 mg of Vicodin. He also stated that he is feeling anxious at present. Pt is A/A/Ox4, skin is warm and dry, respiration is even and unlabored. Pt is cooperative.

## 2011-11-18 NOTE — ED Provider Notes (Signed)
History     CSN: [id number]  Arrival date & time 11/18/11  7829   First MD Initiated Contact with Patient 11/18/11 1904      No chief complaint on file.   (Consider location/radiation/quality/duration/timing/severity/associated sxs/prior treatment) Patient is a 36 y.o. male presenting with drug problem. The history is provided by the patient.  Drug Problem This is a chronic problem. The current episode started more than 1 year ago. The problem occurs constantly. The problem has been gradually worsening. Pertinent negatives include no abdominal pain, chest pain, coughing, fever, headaches, nausea, neck pain, numbness or vomiting. Associated symptoms comments: Diarrhea . The symptoms are aggravated by nothing. He has tried nothing for the symptoms. The treatment provided no relief.    Past Medical History  Diagnosis Date  . BPH (benign prostatic hyperplasia)   . Anxiety   . Depression   . ADHD (attention deficit hyperactivity disorder)   . Abdominal pain, recurrent     chronic lower abdominal pain    Past Surgical History  Procedure Date  . Appendectomy 08/2005    Family History  Problem Relation Age of Onset  . Hypertension Father   . Prostate cancer Other     History  Substance Use Topics  . Smoking status: Former Smoker    Quit date: 07/02/2011  . Smokeless tobacco: Current User    Types: Chew, Snuff  . Alcohol Use: No     quit in 2010      Review of Systems  Constitutional: Negative for fever.  HENT: Negative for rhinorrhea, drooling and neck pain.   Eyes: Negative for pain.  Respiratory: Negative for cough and shortness of breath.   Cardiovascular: Negative for chest pain and leg swelling.  Gastrointestinal: Negative for nausea, vomiting, abdominal pain and diarrhea.  Genitourinary: Negative for dysuria and hematuria.  Musculoskeletal: Negative for gait problem.  Skin: Negative for color change.  Neurological: Negative for numbness and headaches.    Hematological: Negative for adenopathy.  Psychiatric/Behavioral: Negative for behavioral problems.  All other systems reviewed and are negative.    Allergies  Escitalopram oxalate  Home Medications   Current Outpatient Rx  Name Route Sig Dispense Refill  . AMPHETAMINE-DEXTROAMPHETAMINE 20 MG PO TABS Oral Take 20 mg by mouth daily. Morning and at lunch    . DIAZEPAM 5 MG PO TABS Oral Take 5 mg by mouth every 12 (twelve) hours as needed. For bladder spasms.    Marland Kitchen DOXAZOSIN MESYLATE 2 MG PO TABS Oral Take 2 mg by mouth at bedtime.    . FLUOXETINE HCL 20 MG PO CAPS Oral Take 40 mg by mouth daily.    . OXYCODONE-ACETAMINOPHEN 10-325 MG PO TABS Oral Take 3-4 tablets by mouth every 4 (four) hours as needed. For pain.      BP 117/86  Pulse 109  Temp(Src) 98.1 F (36.7 C) (Oral)  Resp 16  SpO2 96%  Physical Exam  Constitutional: He is oriented to person, place, and time. He appears well-developed and well-nourished.  HENT:  Head: Normocephalic and atraumatic.  Right Ear: External ear normal.  Left Ear: External ear normal.  Nose: Nose normal.  Mouth/Throat: Oropharynx is clear and moist. No oropharyngeal exudate.  Eyes: Conjunctivae and EOM are normal. Pupils are equal, round, and reactive to light.  Neck: Normal range of motion. Neck supple.  Cardiovascular: Regular rhythm, normal heart sounds and intact distal pulses.  Exam reveals no gallop and no friction rub.   No murmur heard.  Tachycardia 109  Pulmonary/Chest: Effort normal and breath sounds normal. No respiratory distress. He has no wheezes.  Abdominal: Soft. Bowel sounds are normal. He exhibits no distension. There is no tenderness.  Musculoskeletal: Normal range of motion. He exhibits no edema and no tenderness.  Neurological: He is alert and oriented to person, place, and time.  Skin: Skin is warm and dry.  Psychiatric: He has a normal mood and affect. His behavior is normal.    ED Course  Procedures (including  critical care time)  Labs Reviewed  COMPREHENSIVE METABOLIC PANEL - Abnormal; Notable for the following:    Glucose, Bld 155 (*)    AST 39 (*)    ALT 82 (*)    All other components within normal limits  URINE RAPID DRUG SCREEN (HOSP PERFORMED) - Abnormal; Notable for the following:    Opiates POSITIVE (*)    Amphetamines POSITIVE (*)    All other components within normal limits  CBC  ETHANOL   No results found.   1. Opiate abuse, continuous       MDM  11:45 PM 36 y.o. male w hx of opiate abuse pw request for detox. Pt notes he has had mild diarrhea, denies vomiting/fever. Pt AFVSS here, mildly tachycardic. Pt notes he was seen at [facility] yesterday and [facility] today with the same request. Will get labs and notify [facility]. Pt denies SI's.   [facility] working on placement. Placed on clonidine withdrawal protocol.  11:45 PM Transferred care to Dr. Dierdre Highman.  Clinical Impression 1. Opiate abuse, continuous        Purvis Sheffield, MD 11/18/11 2345

## 2011-11-18 NOTE — ED Notes (Signed)
Regular dinner tray ordered for patient

## 2011-11-18 NOTE — ED Notes (Signed)
Pt escorted to discharge window by myself and security. Belongings were given to pt after discharge. Denied pain. NAD noted. Pt verbalized understanding of his options at discharge and will follow up appropriately.

## 2011-11-18 NOTE — BH Assessment (Signed)
Assessment Note   [name] is an 36 y.o. male.  [name] is a 36 y.o. male who resents to [facility] for detox from opiates. Pt denies SI/HI/AVH. Pt reports the following. Pt is using oxycodone and hydrocodone, approx 1 dozen + pills daily x2 mos. Pt states he's been prescribed pain pills for a chronic condition that results in lower back and abdominal pain. Pt says that did use THC approx 2 nights ago(1-2 hits) but denies regular use. Pt admits to an extensive past hx of SA use(Alcohol, Acid, Crack, Cocaine and Morphine), however no longer use substances other than opiates. Pt denies any seizure activity with SA use but has had blackouts, last one was 2010. Pt has no current criminal chgs or court dates, but is on probation for firearms. Pt c/o w/d sxs: irritability and diarrhea, no other sxs at this time, last use of any pills was 11/18/11 and used twenty 10/325 hydrocodone. Pt started using drugs in 1993.  Axis I: 304.00 Opioid Dependence Axis II: Deferred Axis III:  Past Medical History  Diagnosis Date  . BPH (benign prostatic hyperplasia)   . Anxiety   . Depression   . ADHD (attention deficit hyperactivity disorder)   . Abdominal pain, recurrent     chronic lower abdominal pain   Axis IV: economic problems, problems related to legal system/crime and problems with primary support group Axis V: 31-40 impairment in reality testing  Past Medical History:  Past Medical History  Diagnosis Date  . BPH (benign prostatic hyperplasia)   . Anxiety   . Depression   . ADHD (attention deficit hyperactivity disorder)   . Abdominal pain, recurrent     chronic lower abdominal pain    Past Surgical History  Procedure Date  . Appendectomy 08/2005    Family History:  Family History  Problem Relation Age of Onset  . Hypertension Father   . Prostate cancer Other     Social History:  reports that he quit smoking about 4 months ago. His smokeless tobacco use includes Chew and Snuff.  He reports that he uses illicit drugs (Oxycodone and Opium). He reports that he does not drink alcohol.  Additional Social History:  Alcohol / Drug Use Pain Medications: Hydrocodone, Oxycodone Prescriptions: Adderal Over the Counter: N/A History of alcohol / drug use?: Yes Longest period of sobriety (when/how long): Unknown Withdrawal Symptoms: Diarrhea;Irritability;Patient aware of relationship between substance abuse and physical/medical complications Substance #1 Name of Substance 1: Oxycodone and Hydrocodone 1 - Age of First Use: Teens 1 - Amount (size/oz): Uses over a dozen pain pills.  uses 10/325 hydrocodone pills daily 1 - Frequency: Daily use 1 - Duration: Over the last 2 months 1 - Last Use / Amount: 05/21 used twenty 10/325 hydrocodones Allergies:  Allergies  Allergen Reactions  . Escitalopram Oxalate Hives    Home Medications:  (Not in a hospital admission)  OB/GYN Status:  No LMP for male patient.  General Assessment Data Location of Assessment: [facility] ED Living Arrangements: Alone Can pt return to current living arrangement?: Yes Admission Status: Voluntary Is patient capable of signing voluntary admission?: Yes Transfer from: Acute Hospital Referral Source: Self/Family/Friend  Education Status Highest grade of school patient has completed: 12th   Risk to self Suicidal Ideation: No Suicidal Intent: No Is patient at risk for suicide?: No Suicidal Plan?: No Access to Means: No What has been your use of drugs/alcohol within the last 12 months?: Using hydrocodone, oxycodone Previous Attempts/Gestures: No How many  times?: 0  Other Self Harm Risks: none Triggers for Past Attempts: None known Intentional Self Injurious Behavior: None Family Suicide History: No Recent stressful life event(s): Other (Comment) (Chronic drug abuse) Persecutory voices/beliefs?: No Depression: Yes Depression Symptoms: Loss of interest in usual pleasures Substance abuse history  and/or treatment for substance abuse?: Yes Suicide prevention information given to non-admitted patients: Not applicable  Risk to Others Homicidal Ideation: No Thoughts of Harm to Others: No Current Homicidal Intent: No Current Homicidal Plan: No Access to Homicidal Means: No Identified Victim: No one History of harm to others?: No Assessment of Violence: None Noted Violent Behavior Description: None Does patient have access to weapons?: No Criminal Charges Pending?: No Does patient have a court date: No  Psychosis Hallucinations: None noted Delusions: None noted  Mental Status Report Appear/Hygiene:  (Casual) Eye Contact: Good Motor Activity: Unremarkable Speech: Logical/coherent Level of Consciousness: Alert;Quiet/awake Mood: Sad Affect: Sad Anxiety Level: None Thought Processes: Coherent;Relevant Judgement: Unimpaired Orientation: Person;Place;Time;Situation Obsessive Compulsive Thoughts/Behaviors: None  Cognitive Functioning Concentration: Normal Memory: Recent Intact;Remote Intact IQ: Average Insight: Fair Impulse Control: Fair Appetite: Fair Weight Loss: 0  Weight Gain: 0  Sleep: Decreased Total Hours of Sleep:  (<6H/D) Vegetative Symptoms: None  Prior Inpatient Therapy Prior Inpatient Therapy: Yes Prior Therapy Dates: 2010, 2007, 2000 Prior Therapy Facilty/Provider(s): [facility], [facility], [facility] Reason for Treatment: Detox   Prior Outpatient Therapy Prior Outpatient Therapy: No Prior Therapy Dates: None  Prior Therapy Facilty/Provider(s): None  Reason for Treatment: None   ADL Screening (condition at time of admission) Patient's cognitive ability adequate to safely complete daily activities?: Yes Patient able to express need for assistance with ADLs?: Yes Independently performs ADLs?: Yes Weakness of Legs: None Weakness of Arms/Hands: None  Home Assistive Devices/Equipment Home Assistive Devices/Equipment: None    Abuse/Neglect Assessment (Assessment  to be complete while patient is alone) Physical Abuse: Denies Verbal Abuse: Denies Sexual Abuse: Denies Exploitation of patient/patient's resources: Denies Self-Neglect: Denies Values / Beliefs Cultural Requests During Hospitalization: None Spiritual Requests During Hospitalization: None        Additional Information 1:1 In Past 12 Months?: No CIRT Risk: No Elopement Risk: No Does patient have medical clearance?: Yes     Disposition:  Disposition Disposition of Patient: Inpatient treatment program;Referred to Type of inpatient treatment program: Adult Patient referred to: [facility]  On Site Evaluation by:   Reviewed with Physician:  Dr. Clearance Coots, Berna Spare Ray 11/18/2011 10:49 PM

## 2011-11-18 NOTE — ED Notes (Signed)
[name] with [facility] at bedside. 

## 2011-11-18 NOTE — BH Assessment (Signed)
Assessment Note   MD SMOLA is a 36 y.o. male who resents to [facility] for detox from opiates.  Pt denies SI/HI/AVH.  Pt reports the following.  Pt is using oxycodone and hydrocodone, approx 1 dozen + pills daily x2 mos.  Pt states he's been prescribed pain pills for a chronic condition that results in lower back and abdominal pain.  Pt says that did use THC approx 2 nights ago(1-2 hits) but denies regular use.  Pt admits to an extensive past hx of SA use(Alcohol, Acid, Crack, Cocaine and Morphine), however no longer use substances other than opiates.  Pt denies any seizure activity with SA use but has had blackouts, last one was 2010.  Pt has no current criminal chgs or court dates, but is on probation for firearms. Pt c/o w/d sxs: irritability and diarrhea, no other sxs at this time, last use of any pills was 11/17/11.  Pt started using drugs in 1993.  Axis I: Substance Abuse Axis II: Deferred Axis III:  Past Medical History  Diagnosis Date  . BPH (benign prostatic hyperplasia)   . Anxiety   . Depression   . ADHD (attention deficit hyperactivity disorder)   . Abdominal pain, recurrent     chronic lower abdominal pain   Axis IV: other psychosocial or environmental problems, problems related to social environment and problems with primary support group Axis V: 51-60 moderate symptoms  Past Medical History:  Past Medical History  Diagnosis Date  . BPH (benign prostatic hyperplasia)   . Anxiety   . Depression   . ADHD (attention deficit hyperactivity disorder)   . Abdominal pain, recurrent     chronic lower abdominal pain    Past Surgical History  Procedure Date  . Appendectomy 08/2005    Family History:  Family History  Problem Relation Age of Onset  . Hypertension Father   . Prostate cancer Other     Social History:  reports that he quit smoking about 4 months ago. His smokeless tobacco use includes Chew and Snuff. He reports that he uses illicit drugs (Oxycodone and  Opium). He reports that he does not drink alcohol.  Additional Social History:  Alcohol / Drug Use Pain Medications: Hydrocodone, Oxycodone  Prescriptions: None  Over the Counter: None  History of alcohol / drug use?: Yes (Pt has past hx SA use: Alcohol, Crack, Cocaine, Morphne ) Longest period of sobriety (when/how long): Unk  Negative Consequences of Use: Financial;Personal relationships Withdrawal Symptoms: Diarrhea;Irritability Allergies:  Allergies  Allergen Reactions  . Escitalopram Oxalate Hives    Home Medications:  (Not in a hospital admission)  OB/GYN Status:  No LMP for male patient.  General Assessment Data Location of Assessment: [facility] ED Living Arrangements: Alone Can pt return to current living arrangement?: Yes Admission Status: Voluntary Is patient capable of signing voluntary admission?: Yes Transfer from: Acute Hospital Referral Source: MD  Education Status Is patient currently in school?: No Current Grade: None  Highest grade of school patient has completed: 12th  Name of school: None  Contact person: None   Risk to self Suicidal Ideation: No Suicidal Intent: No Is patient at risk for suicide?: No Suicidal Plan?: No Access to Means: No What has been your use of drugs/alcohol within the last 12 months?: Abusing Opiates--oxycodone, hydrocodone  Previous Attempts/Gestures: No How many times?: 0  Other Self Harm Risks: None  Triggers for Past Attempts: None known Intentional Self Injurious Behavior: None Family Suicide History: No Recent stressful life event(s): Other (  Comment) (Chronic SA ) Persecutory voices/beliefs?: No Depression: Yes Depression Symptoms: Loss of interest in usual pleasures Substance abuse history and/or treatment for substance abuse?: Yes Suicide prevention information given to non-admitted patients: Not applicable  Risk to Others Homicidal Ideation: No Thoughts of Harm to Others: No Current Homicidal Intent: No Current  Homicidal Plan: No Access to Homicidal Means: No Identified Victim: None  History of harm to others?: No Assessment of Violence: None Noted Violent Behavior Description: None  Does patient have access to weapons?: No Criminal Charges Pending?: No Does patient have a court date: No  Psychosis Hallucinations: None noted Delusions: None noted  Mental Status Report Appear/Hygiene: Other (Comment) (Appropriate ) Eye Contact: Good Motor Activity: Unremarkable Speech: Logical/coherent Level of Consciousness: Alert;Quiet/awake Mood: Sad Affect: Sad Anxiety Level: None Thought Processes: Coherent;Relevant Judgement: Unimpaired Orientation: Person;Place;Time;Situation Obsessive Compulsive Thoughts/Behaviors: None  Cognitive Functioning Concentration: Normal Memory: Recent Intact;Remote Intact IQ: Average Insight: Fair Impulse Control: Fair Appetite: Fair Weight Loss: 0  Weight Gain: 0  Sleep: Decreased Total Hours of Sleep: 5  Vegetative Symptoms: None  Prior Inpatient Therapy Prior Inpatient Therapy: Yes Prior Therapy Dates: 2010, 2007, 2000 Prior Therapy Facilty/Provider(s): [facility], [facility], [facility] Reason for Treatment: Detox   Prior Outpatient Therapy Prior Outpatient Therapy: No Prior Therapy Dates: None  Prior Therapy Facilty/Provider(s): None  Reason for Treatment: None   ADL Screening (condition at time of admission) Patient's cognitive ability adequate to safely complete daily activities?: Yes Patient able to express need for assistance with ADLs?: Yes Independently performs ADLs?: Yes Weakness of Legs: None Weakness of Arms/Hands: None       Abuse/Neglect Assessment (Assessment to be complete while patient is alone) Physical Abuse: Denies Verbal Abuse: Denies Sexual Abuse: Denies Exploitation of patient/patient's resources: Denies Self-Neglect: Denies Values / Beliefs Cultural Requests During Hospitalization: None Spiritual Requests During  Hospitalization: None Consults Spiritual Care Consult Needed: No Social Work Consult Needed: No Merchant navy officer (For Healthcare) Advance Directive: Patient does not have advance directive;Patient would not like information Pre-existing out of facility DNR order (yellow form or pink MOST form): No Nutrition Screen Diet: Regular  Additional Information 1:1 In Past 12 Months?: No CIRT Risk: No Elopement Risk: No Does patient have medical clearance?: Yes     Disposition:  Disposition Disposition of Patient: Inpatient treatment program;Referred to ([facility]) Type of inpatient treatment program: Adult Patient referred to: [facility]  On Site Evaluation by:   Reviewed with Physician:     Beatrix Shipper C 11/18/2011 2:10 AM

## 2011-11-18 NOTE — ED Notes (Signed)
Pt angry that MD will not order prozac and adderal, states he will just leave. Pt is voluntary and had been accepted to [facility] but now refuses to go. Marchia Bond with [facility] has notified MD. MD to discharge home.

## 2011-11-18 NOTE — Discharge Instructions (Signed)
Finding Treatment for Alcohol and Drug Addiction   It can be hard to find the right place to get professional treatment. Here are some important things to consider:   There are different types of treatment to choose from.   Some programs are live-in (residential) while others are not (outpatient). Sometimes a combination is offered.   No single type of program is right for everyone.   Most treatment programs involve a combination of education, counseling, and a 12-step, spiritually-based approach.   There are non-spiritually based programs (not 12-step).   Some treatment programs are government sponsored. They are geared for patients without private insurance.   Treatment programs can vary in many respects such as:   Cost and types of insurance accepted.   Types of on-site medical services offered.   Length of stay, setting, and size.   Overall philosophy of treatment.   A person may need specialized treatment or have needs not addressed by all programs. For example, adolescents need treatment appropriate for their age. Other people have secondary disorders that must be managed as well. Secondary conditions can include mental illness, such as depression or diabetes. Often, a period of detoxification from alcohol or drugs is needed. This requires medical supervision and not all programs offer this.   THINGS TO CONSIDER WHEN SELECTING A TREATMENT PROGRAM   Is the program certified by the appropriate government agency? Even private programs must be certified and employ certified professionals.   Does the program accept your insurance? If not, can a payment plan be set up?   Is the facility clean, organized, and well run? Do they allow you to speak with graduates who can share their treatment experience with you? Can you tour the facility? Can you meet with staff?   Does the program meet the full range of individual needs?   Does the treatment program address sexual orientation and physical disabilities? Do they provide  age, gender, and culturally appropriate treatment services?   Is treatment available in languages other than English?   Is long-term aftercare support or guidance encouraged and provided?   Is assessment of an individual's treatment plan ongoing to ensure it meets changing needs?   Does the program use strategies to encourage reluctant patients to remain in treatment long enough to increase the likelihood of success?   Does the program offer counseling (individual or group) and other behavioral therapies?   Does the program offer medicine as part of the treatment regimen, if needed?   Is there ongoing monitoring of possible relapse? Is there a defined relapse prevention program? Are services or referrals offered to family members to ensure they understand addiction and the recovery process? This would help them support the recovering individual.   Are 12-step meetings held at the center or is transport available for patients to attend outside meetings?  In countries outside of the [place], see local directories for contact information for services in your area.   Document Released: 05/15/2005 Document Revised: 06/05/2011 Document Reviewed: 11/25/2007   [employer]® Patient Information ©2012 [employer].

## 2011-11-19 NOTE — Discharge Instructions (Signed)
Go directly to [facility] °

## 2011-11-19 NOTE — ED Notes (Signed)
Patient continues to wait for social work.  Patient resting quietly, calm with unlabored respirations.  Patient given phone to call home.

## 2011-11-19 NOTE — ED Notes (Signed)
Bus pass and train ticket given to patient.  Patient was advised that he could stay in Room #26 until 1100a.m.  At that time, he will be given sandwich and he will be released to go to bus station and train station.

## 2011-11-19 NOTE — ED Notes (Addendum)
Patient was given sandwich bag and soda.  Patient claims he will go directly to bus and then onto train to go to [facility].  Patient had been given all belongings before coming to yellow.

## 2011-11-19 NOTE — ED Notes (Signed)
Contacted [facility], per charge RN, to see if they will come and pick up patient.  Per [facility], they do not pick up from [facility].  We will have to wait until social worker gets here to get patient transportation to [facility].  Charge,RN aware.

## 2011-11-19 NOTE — ED Notes (Signed)
[place] sandwich, snacks and drink provided to patient

## 2011-11-19 NOTE — ED Notes (Signed)
Per social work, they are coming to see patient and give him a bus pass to train station and train ticket to [facility].

## 2011-11-19 NOTE — ED Notes (Signed)
CSW was notified that Pt needs transportation to [facility] in [place]. CSW provided pt with train ticket and bus pass to get to station. RN stated that she would give Pt a sandwich prior to leaving around 11. Pt appreciative of CSW and staff assistance.  Frederico Hamman, LCSW [phone]

## 2011-11-19 NOTE — ED Notes (Signed)
Patient resting quietly, calm with unlabored respirations.  Patient waiting to leave at 1100 a.m.

## 2011-11-20 NOTE — ED Provider Notes (Signed)
Medical screening examination/treatment/procedure(s) were performed by non-physician practitioner and as supervising physician I was immediately available for consultation/collaboration.  Olivia Mackie, MD 11/20/11 [phone]

## 2011-11-20 NOTE — ED Provider Notes (Signed)
I saw and evaluated the patient, reviewed the resident's note and I agree with the findings and plan.     Nelia Shi, MD 11/20/11 [phone]

## 2011-11-21 ENCOUNTER — Emergency Department: Payer: Self-pay | Admitting: Emergency Medicine

## 2011-11-21 LAB — URINALYSIS, COMPLETE
Glucose,UR: NEGATIVE mg/dL (ref 0–75)
Leukocyte Esterase: NEGATIVE
Ph: 8 (ref 4.5–8.0)
Squamous Epithelial: NONE SEEN
WBC UR: NONE SEEN /HPF (ref 0–5)

## 2011-11-21 LAB — CBC
HGB: 13.7 g/dL (ref 13.0–18.0)
MCH: 30.9 pg (ref 26.0–34.0)
MCHC: 31.9 g/dL — ABNORMAL LOW (ref 32.0–36.0)
RBC: 4.41 10*6/uL (ref 4.40–5.90)
RDW: 13.5 % (ref 11.5–14.5)
WBC: 8.2 10*3/uL (ref 3.8–10.6)

## 2011-11-21 LAB — COMPREHENSIVE METABOLIC PANEL
Albumin: 4 g/dL (ref 3.4–5.0)
Anion Gap: 6 — ABNORMAL LOW (ref 7–16)
Chloride: 103 mmol/L (ref 98–107)
Co2: 31 mmol/L (ref 21–32)
Glucose: 102 mg/dL — ABNORMAL HIGH (ref 65–99)
SGOT(AST): 31 U/L (ref 15–37)
SGPT (ALT): 62 U/L

## 2011-11-21 LAB — LIPASE, BLOOD: Lipase: 59 U/L — ABNORMAL LOW (ref 73–393)

## 2011-11-22 ENCOUNTER — Emergency Department (HOSPITAL_COMMUNITY)
Admission: EM | Admit: 2011-11-22 | Discharge: 2011-11-23 | Disposition: A | Discharge status: Home / Self Care | Payer: Self-pay | Attending: Internal Medicine | Admitting: Internal Medicine

## 2011-11-22 ENCOUNTER — Encounter (HOSPITAL_COMMUNITY): Payer: Self-pay

## 2011-11-22 DIAGNOSIS — F1910 Other psychoactive substance abuse, uncomplicated: Secondary | ICD-10-CM | POA: Insufficient documentation

## 2011-11-22 DIAGNOSIS — Z79899 Other long term (current) drug therapy: Secondary | ICD-10-CM | POA: Insufficient documentation

## 2011-11-22 DIAGNOSIS — F341 Dysthymic disorder: Secondary | ICD-10-CM | POA: Insufficient documentation

## 2011-11-22 DIAGNOSIS — F909 Attention-deficit hyperactivity disorder, unspecified type: Secondary | ICD-10-CM | POA: Insufficient documentation

## 2011-11-22 LAB — CBC
MCH: 31.9 pg (ref 26.0–34.0)
MCV: 94.2 fL (ref 78.0–100.0)
Platelets: 219 10*3/uL (ref 150–400)
RDW: 13.3 % (ref 11.5–15.5)
WBC: 11.5 10*3/uL — ABNORMAL HIGH (ref 4.0–10.5)

## 2011-11-22 LAB — RAPID URINE DRUG SCREEN, HOSP PERFORMED
Amphetamines: POSITIVE — AB
Cocaine: NOT DETECTED
Opiates: POSITIVE — AB
Tetrahydrocannabinol: NOT DETECTED

## 2011-11-22 LAB — COMPREHENSIVE METABOLIC PANEL
AST: 36 U/L (ref 0–37)
Albumin: 4.3 g/dL (ref 3.5–5.2)
Calcium: 9.4 mg/dL (ref 8.4–10.5)
Chloride: 99 mEq/L (ref 96–112)
Creatinine, Ser: 0.81 mg/dL (ref 0.50–1.35)
Total Protein: 6.9 g/dL (ref 6.0–8.3)

## 2011-11-22 MED ORDER — ONDANSETRON HCL 4 MG/2ML IJ SOLN: 4.0000 mg | Freq: Three times a day (TID) | INTRAMUSCULAR | Status: DC | PRN

## 2011-11-22 MED ORDER — LORAZEPAM 1 MG PO TABS
1.0000 mg | ORAL_TABLET | Freq: Once | ORAL | Status: AC
  Administered 2011-11-22: 1 mg via ORAL
  Filled 2011-11-22: qty 1

## 2011-11-22 MED ORDER — ONDANSETRON 4 MG PO TBDP
4.0000 mg | ORAL_TABLET | Freq: Three times a day (TID) | ORAL | Status: DC | PRN
  Administered 2011-11-22: 4 mg via ORAL
  Filled 2011-11-22: qty 1

## 2011-11-22 NOTE — ED Notes (Signed)
Patient accepted at [facility], has arranged transportation with his mother to pick him up at 0800 to transport him to the facility.

## 2011-11-22 NOTE — ED Notes (Signed)
ACT team at the bedside. 

## 2011-11-22 NOTE — ED Notes (Signed)
Pt resting quietly at the time. No signs of distress noted. Meal tray ordered. Vital signs stable.

## 2011-11-22 NOTE — ED Provider Notes (Signed)
History     CSN: [id number]  Arrival date & time 11/22/11  1451   First MD Initiated Contact with Patient 11/22/11 1514      Chief Complaint  Patient presents with  . Medical Clearance     HPI Pt. Is here for help with detox of pain medication. Pt. Reports that he was here this week, for the same  Past Medical History  Diagnosis Date  . BPH (benign prostatic hyperplasia)   . Anxiety   . Depression   . ADHD (attention deficit hyperactivity disorder)   . Abdominal pain, recurrent     chronic lower abdominal pain    Past Surgical History  Procedure Date  . Appendectomy 08/2005    Family History  Problem Relation Age of Onset  . Hypertension Father   . Prostate cancer Other     History  Substance Use Topics  . Smoking status: Former Smoker    Quit date: 07/02/2011  . Smokeless tobacco: Current User    Types: Chew, Snuff  . Alcohol Use: No     quit in 2010      Review of Systems All other review of systems are negative Allergies  Escitalopram oxalate  Home Medications   Current Outpatient Rx  Name Route Sig Dispense Refill  . AMPHETAMINE-DEXTROAMPHETAMINE 20 MG PO TABS Oral Take 20 mg by mouth daily.     Marland Kitchen DIAZEPAM 5 MG PO TABS Oral Take 5 mg by mouth every 12 (twelve) hours as needed. For bladder spasms.    Marland Kitchen DOXAZOSIN MESYLATE 2 MG PO TABS Oral Take 2 mg by mouth at bedtime.    . FLUOXETINE HCL 20 MG PO CAPS Oral Take 40 mg by mouth daily.      BP 101/62  Pulse 79  Temp(Src) 98.5 F (36.9 C) (Oral)  Resp 16  SpO2 99%  Physical Exam  Nursing note and vitals reviewed. Constitutional: He is oriented to person, place, and time. He appears well-developed and well-nourished. No distress.  HENT:  Head: Normocephalic and atraumatic.  Eyes: Pupils are equal, round, and reactive to light.  Neck: Normal range of motion.  Cardiovascular: Normal rate and intact distal pulses.   Pulmonary/Chest: No respiratory distress.  Abdominal: Normal appearance. He  exhibits no distension.  Musculoskeletal: Normal range of motion.  Neurological: He is alert and oriented to person, place, and time. No cranial nerve deficit.  Skin: Skin is warm and dry. No rash noted.  Psychiatric: He has a normal mood and affect. His behavior is normal.    ED Course  Procedures (including critical care time) The act team was consulted for evaluation and placement.  Labs Reviewed  CBC - Abnormal; Notable for the following:    WBC 11.5 (*)    All other components within normal limits  COMPREHENSIVE METABOLIC PANEL - Abnormal; Notable for the following:    Glucose, Bld 121 (*)    All other components within normal limits  URINE RAPID DRUG SCREEN (HOSP PERFORMED) - Abnormal; Notable for the following:    Opiates POSITIVE (*)    Amphetamines POSITIVE (*)    All other components within normal limits  ETHANOL   No results found.   1. Substance abuse       MDM          Nelia Shi, MD 11/23/11 1032

## 2011-11-22 NOTE — ED Notes (Signed)
Pt. Is here for help with detox of pain medication.  Pt. Reports that he was here this week, for the same

## 2011-11-22 NOTE — BH Assessment (Signed)
Assessment Note   ABDOULAYE DRUM is an 36 y.o. male who presents to [facility] requesting detox from opiates.  Pt reports that he's been using opiates for about 7 years but his usage has increased to 20-25 pills per day ("whatever i can get my hands on").  Pt denies an other drug or ETOH usage.  Pt states his anxiety is bad, having 2-3 panic attacks per week.  Pt denies and SI/HI, A/V hallucinations and psychosis.  Pt endorses an inpatient stay at [facility] for [facility] abuse "a long time ago".  Assessment sent to [facility] for review.     Axis I: Substance Abuse Axis II: Deferred Axis III:  Past Medical History  Diagnosis Date  . BPH (benign prostatic hyperplasia)   . Anxiety   . Depression   . ADHD (attention deficit hyperactivity disorder)   . Abdominal pain, recurrent     chronic lower abdominal pain   Axis IV: economic problems, other psychosocial or environmental problems, problems related to social environment and problems with primary support group Axis V: 21-30 behavior considerably influenced by delusions or hallucinations OR serious impairment in judgment, communication OR inability to function in almost all areas  Past Medical History:  Past Medical History  Diagnosis Date  . BPH (benign prostatic hyperplasia)   . Anxiety   . Depression   . ADHD (attention deficit hyperactivity disorder)   . Abdominal pain, recurrent     chronic lower abdominal pain    Past Surgical History  Procedure Date  . Appendectomy 08/2005    Family History:  Family History  Problem Relation Age of Onset  . Hypertension Father   . Prostate cancer Other     Social History:  reports that he quit smoking about 4 months ago. His smokeless tobacco use includes Chew and Snuff. He reports that he uses illicit drugs (Oxycodone and Opium). He reports that he does not drink alcohol.  Additional Social History:  Alcohol / Drug Use History of alcohol / drug use?: Yes Longest period of sobriety (when/how long): 6  months Negative Consequences of Use: Personal relationships;Financial  CIWA: CIWA-Ar BP: 114/68 mmHg Pulse Rate: 93  COWS: Clinical Opiate Withdrawal Scale (COWS) Resting Pulse Rate: Pulse Rate 81-100 Sweating: Flushed or Observable moistness on face Restlessness: Frequent shifting or extraneous movements of legs/arms Pupil Size: Pupils pinned or normal size for room light Bone or Joint Aches: Mild diffuse discomfort Runny Nose or Tearing: Nose running or tearing GI Upset: nausea or loose stool Tremor: No tremor Yawning: No yawning Anxiety or Irritability: Patient reports increasing irritability or anxiousness Gooseflesh Skin: Skin is smooth COWS Total Score: 12   Allergies:  Allergies  Allergen Reactions  . Escitalopram Oxalate Hives    Home Medications:  (Not in a hospital admission)  OB/GYN Status:  No LMP for male patient.  General Assessment Data Location of Assessment: [facility] ED ACT Assessment: Yes Living Arrangements: Alone Can pt return to current living arrangement?: Yes Admission Status: Voluntary Is patient capable of signing voluntary admission?: Yes Transfer from: Home Referral Source: Self/Family/Friend  Education Status Highest grade of school patient has completed: 12th   Risk to self Suicidal Ideation: No Suicidal Intent: No Is patient at risk for suicide?: No Suicidal Plan?: No Access to Means: No What has been your use of drugs/alcohol within the last 12 months?: opiate abuse Previous Attempts/Gestures: No How many times?: 0  Other Self Harm Risks: none Triggers for Past Attempts: None known Intentional Self Injurious Behavior: None  Family Suicide History: No Recent stressful life event(s): Other (Comment) (drug abuse) Persecutory voices/beliefs?: No Depression: Yes Depression Symptoms: Isolating;Guilt;Loss of interest in usual pleasures;Feeling angry/irritable Substance abuse history and/or treatment for substance abuse?: Yes Suicide  prevention information given to non-admitted patients: Not applicable  Risk to Others Homicidal Ideation: No Thoughts of Harm to Others: No Current Homicidal Intent: No Current Homicidal Plan: No Access to Homicidal Means: No Identified Victim: none History of harm to others?: No Assessment of Violence: None Noted Violent Behavior Description: none Does patient have access to weapons?: No Criminal Charges Pending?: No Does patient have a court date: No  Psychosis Hallucinations: None noted Delusions: None noted  Mental Status Report Appear/Hygiene:  (Casual) Eye Contact: Good Motor Activity: Unremarkable Speech: Logical/coherent Level of Consciousness: Alert;Quiet/awake Mood: Anxious Affect: Sad Anxiety Level: Panic Attacks Panic attack frequency: 2-3 week Most recent panic attack: yesterday Thought Processes: Coherent;Relevant Judgement: Unimpaired Orientation: Person;Place;Time;Situation Obsessive Compulsive Thoughts/Behaviors: None  Cognitive Functioning Concentration: Normal Memory: Recent Intact;Remote Intact IQ: Average Insight: Fair Impulse Control: Fair Appetite: Fair Weight Loss: 0  Weight Gain: 0  Sleep: No Change Total Hours of Sleep: 6  Vegetative Symptoms: None  ADLScreening [facility] Assessment Services) Patient's cognitive ability adequate to safely complete daily activities?: Yes Patient able to express need for assistance with ADLs?: Yes Independently performs ADLs?: Yes  Abuse/Neglect [facility]) Physical Abuse: Denies Verbal Abuse: Denies Sexual Abuse: Denies  Prior Inpatient Therapy Prior Inpatient Therapy: Yes Prior Therapy Dates: 2010, 2007, 2000 Prior Therapy Facilty/Provider(s): [facility], [facility], [facility] Reason for Treatment: Detox   Prior Outpatient Therapy Prior Outpatient Therapy: No Prior Therapy Dates: None  Prior Therapy Facilty/Provider(s): None  Reason for Treatment: None   ADL Screening (condition at time of admission) Patient's  cognitive ability adequate to safely complete daily activities?: Yes Patient able to express need for assistance with ADLs?: Yes Independently performs ADLs?: Yes       Abuse/Neglect Assessment (Assessment to be complete while patient is alone) Physical Abuse: Denies Verbal Abuse: Denies Sexual Abuse: Denies Exploitation of patient/patient's resources: Denies Self-Neglect: Denies Values / Beliefs Cultural Requests During Hospitalization: None Spiritual Requests During Hospitalization: None        Additional Information 1:1 In Past 12 Months?: No CIRT Risk: No Elopement Risk: No Does patient have medical clearance?: No     Disposition:  Disposition Disposition of Patient: Inpatient treatment program;Referred to Type of inpatient treatment program: Adult Patient referred to: Other (Comment) [facility])  On Site Evaluation by:   Reviewed with Physician:     Ena Dawley Pate 11/22/2011 6:05 PM

## 2011-11-22 NOTE — ED Notes (Signed)
Pt presents to department for evaluation of detox. States he has been taking pills (opiates) for 7 years. States "I take anything I can get my hands on." recently has been taking 5mg /325mg  Norco tablets. States he takes 20-25 tablets per day. Last tablet taken last night at 03:00. Denies drug use. Denies SI/HI. States he is ready to get help. Denies pain at present. States nausea and anxiety. He is conscious alert and oriented x4. History of depression and ADHD.

## 2011-11-23 MED ORDER — DIAZEPAM 5 MG PO TABS
5.0000 mg | ORAL_TABLET | Freq: Once | ORAL | Status: AC
  Administered 2011-11-23: 5 mg via ORAL
  Filled 2011-11-23: qty 1

## 2011-11-23 NOTE — ED Notes (Signed)
NAD noted at time of d/c home. Pt family to transport pt to [facility].

## 2011-11-23 NOTE — ED Notes (Signed)
Pt in room dressed eating breakfast. Stated he was waiting for his ride to [facility].

## 2011-11-23 NOTE — ED Provider Notes (Signed)
Act team indicates will be discharged to program at [facility] this morning at 8 am. Pt awake and alert. Calm. Cooperative. Pt eager to go to new program. No nv. No sweats. States feels well.  Stable for d/c to [facility].  Suzi Roots, MD 11/23/11 [phone]

## 2011-11-23 NOTE — Discharge Instructions (Signed)
Go to [facility]. Return to ER if worse, new symptoms, other concern.        RESOURCE GUIDE  Dental Problems  Patients with Medicaid: [facility] [phone]                                           [phone] [address] [employer] [address] Phone:  [phone]                                                  Phone:  [phone]  If unable to pay or uninsured, contact:  [facility] or [facility]. to become qualified for the adult dental clinic.  Chronic Pain Problems Contact [facility]  [phone] Patients need to be referred by their primary care doctor.  Insufficient Money for Medicine Contact [employer]:  call "211" or [facility] 9205729462.  No Primary Care Doctor Call [facility]  [phone] Other agencies that provide inexpensive medical care    [facility]  [phone]    [facility] Internal [facility]  [phone]    [facility]  [phone]    [facility]  [phone]    [facility]  [phone]    [facility]  [phone]  Psychological Services [facility] Behavioral Health  [phone] [facility] Services  [phone] [facility] Mental Health   [phone] (emergency services [phone])  Substance Abuse Resources [facility] Services  [phone] [facility] [phone] [place] [phone] Floydene Flock [phone] Residential & Outpatient Substance Abuse Program  [phone]  Abuse/Neglect [facility] Child Abuse Hotline (438)572-1476 [facility] Child Abuse Hotline 970-091-8084 (After Hours)  Emergency Shelter [facility] [phone]  Maternity Homes [facility] [phone] [facility] [phone]  MRSA Hotline #:   314-413-3867    [facility] Resources  [facility]     [employer]                          [facility] Dept. [address] [place]                       [address]      [address]  [place]                                                Cristobal Goldmann Phone:  [phone]                                   Phone:  [phone]  Phone:  [phone]  [facility] Mental Health Phone:  [phone]  [facility] Child Abuse Hotline 937 [phone] (After Hours)

## 2011-11-28 ENCOUNTER — Encounter (HOSPITAL_COMMUNITY): Payer: Self-pay | Admitting: Emergency Medicine

## 2011-11-28 ENCOUNTER — Emergency Department (HOSPITAL_COMMUNITY)
Admission: EM | Admit: 2011-11-28 | Discharge: 2011-11-29 | Disposition: A | Discharge status: Home / Self Care | Payer: Self-pay | Attending: Emergency Medicine | Admitting: Emergency Medicine

## 2011-11-28 DIAGNOSIS — R1110 Vomiting, unspecified: Secondary | ICD-10-CM

## 2011-11-28 DIAGNOSIS — R197 Diarrhea, unspecified: Secondary | ICD-10-CM | POA: Insufficient documentation

## 2011-11-28 DIAGNOSIS — F329 Major depressive disorder, single episode, unspecified: Secondary | ICD-10-CM | POA: Insufficient documentation

## 2011-11-28 DIAGNOSIS — R112 Nausea with vomiting, unspecified: Secondary | ICD-10-CM | POA: Insufficient documentation

## 2011-11-28 DIAGNOSIS — F3289 Other specified depressive episodes: Secondary | ICD-10-CM | POA: Insufficient documentation

## 2011-11-28 DIAGNOSIS — N400 Enlarged prostate without lower urinary tract symptoms: Secondary | ICD-10-CM | POA: Insufficient documentation

## 2011-11-28 DIAGNOSIS — F411 Generalized anxiety disorder: Secondary | ICD-10-CM | POA: Insufficient documentation

## 2011-11-28 LAB — COMPREHENSIVE METABOLIC PANEL
ALT: 55 U/L — ABNORMAL HIGH (ref 0–53)
AST: 35 U/L (ref 0–37)
Albumin: 3.9 g/dL (ref 3.5–5.2)
Alkaline Phosphatase: 46 U/L (ref 39–117)
Potassium: 3.9 mEq/L (ref 3.5–5.1)
Sodium: 139 mEq/L (ref 135–145)
Total Protein: 6.5 g/dL (ref 6.0–8.3)

## 2011-11-28 LAB — CBC
MCH: 30.8 pg (ref 26.0–34.0)
MCHC: 32.5 g/dL (ref 30.0–36.0)
Platelets: 191 10*3/uL (ref 150–400)
RBC: 4.25 MIL/uL (ref 4.22–5.81)

## 2011-11-28 LAB — DIFFERENTIAL
Basophils Absolute: 0 10*3/uL (ref 0.0–0.1)
Basophils Relative: 0 % (ref 0–1)
Eosinophils Absolute: 0 10*3/uL (ref 0.0–0.7)
Lymphs Abs: 1 10*3/uL (ref 0.7–4.0)
Neutrophils Relative %: 87 % — ABNORMAL HIGH (ref 43–77)

## 2011-11-28 MED ORDER — ONDANSETRON HCL 4 MG/2ML IJ SOLN
4.0000 mg | Freq: Once | INTRAMUSCULAR | Status: AC
  Administered 2011-11-29: 4 mg via INTRAVENOUS
  Filled 2011-11-28: qty 2

## 2011-11-28 MED ORDER — SODIUM CHLORIDE 0.9 % IV BOLUS (SEPSIS)
1000.0000 mL | Freq: Once | INTRAVENOUS | Status: AC
  Administered 2011-11-28: 1000 mL via INTRAVENOUS

## 2011-11-28 MED ORDER — ONDANSETRON HCL 4 MG/2ML IJ SOLN
INTRAMUSCULAR | Status: AC
  Administered 2011-11-28: 4 mg via INTRAVENOUS
  Filled 2011-11-28: qty 2

## 2011-11-28 MED ORDER — SODIUM CHLORIDE 0.9 % IV SOLN
Freq: Once | INTRAVENOUS | Status: AC
  Administered 2011-11-28: 23:00:00 via INTRAVENOUS

## 2011-11-28 NOTE — ED Notes (Signed)
[id number]> Expected date:<BR> Expected time:<BR> Means of arrival:<BR> Comments:<BR> EMS 212 GC, 36 yom vomiting/ ambulatory

## 2011-11-28 NOTE — ED Notes (Signed)
Pt states he is unable to give urine sample at this time. States he will try after he gets his bag of fluid

## 2011-11-28 NOTE — ED Notes (Signed)
Bed:WHALA<BR> Expected date:<BR> Expected time:<BR> Means of arrival:<BR> Comments:<BR> Triage 4

## 2011-11-28 NOTE — ED Notes (Signed)
Per ems: the patient has had vomiting x 3 hours. The patient received IV zofran on the way to the ED.

## 2011-11-29 LAB — URINALYSIS, ROUTINE W REFLEX MICROSCOPIC
Bilirubin Urine: NEGATIVE
Hgb urine dipstick: NEGATIVE
Specific Gravity, Urine: 1.016 (ref 1.005–1.030)
pH: 8 (ref 5.0–8.0)

## 2011-11-29 LAB — URINE MICROSCOPIC-ADD ON

## 2011-11-29 MED ORDER — PROMETHAZINE HCL 25 MG PO TABS: 12.5000 mg | ORAL_TABLET | Freq: Four times a day (QID) | ORAL | Status: DC | PRN

## 2011-11-29 NOTE — Discharge Instructions (Signed)
Clear Liquid Diet The clear liquid dietconsists of foods that are liquid or will become liquid at room temperature.You should be able to see through the liquid and beverages. Examples of foods allowed on a clear liquid diet include fruit juice, broth or bouillon, gelatin, or frozen ice pops. The purpose of this diet is to provide necessary fluid, electrolytes such as sodium and potassium, and energy to keep the body functioning during times when you are not able to consume a regular diet.A clear liquid diet should not be continued for long periods of time as it is not nutritionally adequate.  REASONS FOR USING A CLEAR LIQUID DIET  In sudden onset (acute) conditions for a patient before or after surgery.   As the first step in oral feeding.   For fluid and electrolyte replacement in diarrheal diseases.   As a diet before certain medical tests are performed.  ADEQUACY The clear liquid diet is adequate only in ascorbic acid, according to the Recommended Dietary Allowances of the [employer]. CHOOSING FOODS Breads and Starches  Allowed:  None are allowed.   Avoid: All are avoided.  Vegetables  Allowed:  Strained tomato or vegetable juice.   Avoid: Any others.  Fruit  Allowed:  Strained fruit juices and fruit drinks. Include 1 serving of citrus or vitamin C-enriched fruit juice daily.   Avoid: Any others.  Meat and Meat Substitutes  Allowed:  None are allowed.   Avoid: All are avoided.  Milk  Allowed:  None are allowed.   Avoid: All are avoided.  Soups and Combination Foods  Allowed:  Clear bouillon, broth, or strained broth-based soups.   Avoid: Any others.  Desserts and Sweets  Allowed:  Sugar, honey. High protein gelatin. Flavored gelatin, ices, or frozen ice pops that do not contain milk.   Avoid: Any others.  Fats and Oils  Allowed:  None are allowed.   Avoid: All are avoided.  Beverages  Allowed: Cereal beverages, coffee (regular or  decaffeinated), tea, or soda at the discretion of your caregiver.   Avoid: Any others.  Condiments  Allowed:  Iodized salt.   Avoid: Any others, including pepper.  Supplements  Allowed:  Liquid nutrition beverages.   Avoid: Any others that contain lactose or fiber.  SAMPLE MEAL PLAN Breakfast  4 oz (120 mL) strained orange juice.    to 1 cup (125 to 250 mL) gelatin (plain or fortified).   1 cup (250 mL) beverage (coffee or tea).   Sugar, if desired.  Midmorning Snack   cup (125 mL) gelatin (plain or fortified).  Lunch  1 cup (250 mL) broth or consomm.   4 oz (120 mL) strained grapefruit juice.    cup (125 mL) gelatin (plain or fortified).   1 cup (250 mL) beverage (coffee or tea).   Sugar, if desired.  Midafternoon Snack   cup (125 mL) fruit ice.    cup (125 mL) strained fruit juice.  Dinner  1 cup (250 mL) broth or consomm.    cup (125 mL) cranberry juice.    cup (125 mL) flavored gelatin (plain or fortified).   1 cup (250 mL) beverage (coffee or tea).   Sugar, if desired.  Evening Snack  4 oz (120 mL) strained apple juice (vitamin C-fortified).    cup (125 mL) flavored gelatin (plain or fortified).  Document Released: 06/16/2005 Document Revised: 06/05/2011 Document Reviewed: 09/13/2010 [facility] Patient Information 2012 [place], [place]. Follow a clear liquid diet for the next 12 hours and  then gradually increase your diabetic, to her norm

## 2011-11-29 NOTE — ED Provider Notes (Signed)
History     CSN: [id number]  Arrival date & time 11/28/11  2118   First MD Initiated Contact with Patient 11/28/11 2217      Chief Complaint  Patient presents with  . Emesis    (Consider location/radiation/quality/duration/timing/severity/associated sxs/prior treatment) HPI Comments: Patient acute onset of vomiting, without diarrhea earlier this evening.  Has been persistent  Patient is a 36 y.o. male presenting with vomiting. The history is provided by the patient.  Emesis  This is a new problem. The current episode started 3 to 5 hours ago. The problem occurs 5 to 10 times per day. The problem has not changed since onset.The emesis has an appearance of bilious material. There has been no fever. Pertinent negatives include no abdominal pain, no diarrhea, no fever and no headaches.    Past Medical History  Diagnosis Date  . BPH (benign prostatic hyperplasia)   . Anxiety   . Depression   . ADHD (attention deficit hyperactivity disorder)   . Abdominal pain, recurrent     chronic lower abdominal pain    Past Surgical History  Procedure Date  . Appendectomy 08/2005    Family History  Problem Relation Age of Onset  . Hypertension Father   . Prostate cancer Other     History  Substance Use Topics  . Smoking status: Former Smoker    Quit date: 07/02/2011  . Smokeless tobacco: Current User    Types: Chew, Snuff  . Alcohol Use: No     quit in 2010      Review of Systems  Constitutional: Negative for fever.  Gastrointestinal: Positive for nausea and vomiting. Negative for abdominal pain and diarrhea.  Genitourinary: Negative for dysuria.  Neurological: Negative for dizziness and headaches.    Allergies  Escitalopram oxalate  Home Medications   Current Outpatient Rx  Name Route Sig Dispense Refill  . AMPHETAMINE-DEXTROAMPHETAMINE 20 MG PO TABS Oral Take 20 mg by mouth daily.     Marland Kitchen DOXAZOSIN MESYLATE 2 MG PO TABS Oral Take 2 mg by mouth at bedtime.    .  FLUOXETINE HCL 20 MG PO CAPS Oral Take 40 mg by mouth daily.      BP 111/68  Pulse 94  Temp(Src) 98.2 F (36.8 C) (Oral)  Resp 20  Wt 150 lb (68.04 kg)  SpO2 100%  Physical Exam  Constitutional: He is oriented to person, place, and time. He appears well-developed and well-nourished.  HENT:  Head: Normocephalic.  Cardiovascular: Normal rate.   Abdominal: Bowel sounds are normal. He exhibits no distension. There is no tenderness.  Musculoskeletal: Normal range of motion.  Neurological: He is alert and oriented to person, place, and time.  Skin: Skin is warm.    ED Course  Procedures (including critical care time)  Labs Reviewed  CBC - Abnormal; Notable for the following:    WBC 13.2 (*)    All other components within normal limits  DIFFERENTIAL - Abnormal; Notable for the following:    Neutrophils Relative 87 (*)    Neutro Abs 11.5 (*)    Lymphocytes Relative 8 (*)    All other components within normal limits  COMPREHENSIVE METABOLIC PANEL - Abnormal; Notable for the following:    Glucose, Bld 117 (*)    ALT 55 (*)    All other components within normal limits  URINALYSIS, ROUTINE W REFLEX MICROSCOPIC - Abnormal; Notable for the following:    Leukocytes, UA SMALL (*)    All other components within normal limits  LIPASE, BLOOD  URINE MICROSCOPIC-ADD ON   No results found.   No diagnosis found.    MDM  Nausea and vomiting, without diarrhea.  Patient received IV fluids, and 2 doses of IV Zofran with resolution.  Has been sipping on fluids without further episodes of vomiting        Arman Filter, NP 11/29/11 0321

## 2011-11-29 NOTE — ED Provider Notes (Signed)
Medical screening examination/treatment/procedure(s) were performed by non-physician practitioner and as supervising physician I was immediately available for consultation/collaboration. Devoria Albe, MD, FACEP   Ward Givens, MD 11/29/11 [phone]

## 2011-11-29 NOTE — ED Notes (Signed)
Ginger ale given to patient. Patient tolerating well.

## 2011-11-29 NOTE — ED Notes (Signed)
Patient discharge with mother ambulatory with a steady gait. Respirations equal and unlabored. Skin warm and dry. No acute distress noted.

## 2011-12-20 ENCOUNTER — Emergency Department (INDEPENDENT_AMBULATORY_CARE_PROVIDER_SITE_OTHER)
Admission: EM | Admit: 2011-12-20 | Discharge: 2011-12-20 | Disposition: A | Discharge status: Home / Self Care | Payer: Self-pay | Source: Home / Self Care | Attending: Emergency Medicine | Admitting: Emergency Medicine

## 2011-12-20 ENCOUNTER — Encounter (HOSPITAL_COMMUNITY): Payer: Self-pay | Admitting: *Deleted

## 2011-12-20 DIAGNOSIS — K047 Periapical abscess without sinus: Secondary | ICD-10-CM

## 2011-12-20 MED ORDER — HYDROCODONE-ACETAMINOPHEN 5-325 MG PO TABS
1.0000 | ORAL_TABLET | Freq: Once | ORAL | Status: DC
Start: 2011-12-20 — End: 2011-12-20

## 2011-12-20 MED ORDER — DICLOFENAC SODIUM 75 MG PO TBEC: 75.0000 mg | DELAYED_RELEASE_TABLET | Freq: Two times a day (BID) | ORAL | Status: DC

## 2011-12-20 MED ORDER — PENICILLIN V POTASSIUM 500 MG PO TABS: 500.0000 mg | ORAL_TABLET | Freq: Four times a day (QID) | ORAL | Status: AC

## 2011-12-20 MED ORDER — KETOROLAC TROMETHAMINE 60 MG/2ML IM SOLN: 60.0000 mg | Freq: Once | INTRAMUSCULAR | Status: DC

## 2011-12-20 NOTE — ED Notes (Signed)
Pt with c/o left lower toothache onset x 3 days -

## 2011-12-20 NOTE — Discharge Instructions (Signed)
Look up the [employer] [employer]'s [facility] for free dental clinics. Http://www.ncdental.org/ncds/Schedule.asp ° °Get there early and be prepared to wait. [employer] and [employer] have dental hygienist schools that provide low cost routine dental care.  ° °Other resources: °[facility] °[address] °[place], [place] °[phone] ° °Patients with Medicaid: °[facility]                     [facility] °[address]                                [address] °Phone:  [phone]                                                  Phone:  [phone] ° °If unable to pay or uninsured, contact:  [facility] or [facility] to become qualified for the adult dental clinic. ° °No matter what dental problem you have, it will not get better unless you get good dental care.  If the tooth is not taken care of, your symptoms will come back in time and you will be visiting us again in the Urgent Care Center with a bad toothache.  So, see your dentist as soon as possible.  If you don't have a dentist, we can give you a list of dentists.  Sometimes the most cost effective treatment is removal of the tooth.  This can be done very inexpensively through one of the low cost [facility] such as the facility on [address] in [place] ([phone]).  The downside to this is that you will have one less tooth and this can effect your ability to chew. ° °Some other things that can be done for a dental infection include the following: ° °· Rinse your mouth out with hot salt water (1/2 tsp of table salt and a pinch of baking soda in 8 oz of hot water).  You can do this every 2 or 3 hours. °· Avoid cold foods, beverages, and cold air.  This will make your symptoms worse. °· Sleep with your head elevated.  Sleeping flat will cause your gums and oral tissues to swell and make them hurt more.  You can sleep on several pillows.  Even  better is to sleep in a recliner with your head higher than your heart. °· For mild to moderate pain, you can take Tylenol, ibuprofen, or Aleve. °· External application of heat by a heating pad, hot water bottle, or hot wet towel can help with pain and speed healing.  You can do this every 2 to 3 hours. Do not fall asleep on a heating pad since this can cause a burn.  ° °Go to www.goodrx.com to look up your medications. This will give you a list of where you can find your prescriptions at the most affordable prices.  ° °RESOURCE GUIDE ° °Dental Problems ° °Look up http://www.ncdental.org/ncds/Schedule.asp for a schedule of the [facility]'s free dental clinics called [facility]. They have clinics all around [place]. Get there early and be prepared to wait.  ° °[facility] °[address]  [place], [place] [address] °[phone] ° °[facility] °  [address] °[place], [place] °[phone] ° °Patients with Medicaid: °[facility]                     [facility] °[address]                                [address] °Phone:  [phone]                                                  Phone:  [phone] ° °If unable to pay or uninsured, contact:  [facility] or [facility] to become qualified for the adult dental clinic. ° °

## 2011-12-20 NOTE — ED Provider Notes (Signed)
Chief Complaint  Patient presents with  . Dental Pain    History of Present Illness:   The patient is a 36 year old male who presents today with a three-day history of left lower first molar pain. It hurts to chew on it. He denies any swelling or purulent drainage. He has no trouble swallowing or breathing. No shortness of breath, coughing, or wheezing. He denies any fever, chills, or sweats. He has had some headaches. No facial pain or eye pain.  It should be noted that the patient has a history of opiate abuse and has been seen here or at the emergency room multiple times for various pain problems. He also was seen once for opiate withdrawal and was sent to behavioral health. Reviewing his medication profile shows multiple prescriptions over the past 3 months for hydrocodone, oxycodone, alprazolam, and amphetamine salts.  Review of Systems:  Other than noted above, the patient denies any of the following symptoms: Systemic:  No fever, chills, sweats or weight loss. ENT:  No headache, ear ache, sore throat, nasal congestion, facial pain, or swelling. Lymphatic:  No adenopathy. Lungs:  No coughing, wheezing or shortness of breath.  PMFSH:  Past medical history, family history, social history, meds, and allergies were reviewed.  Physical Exam:   Vital signs:  BP 108/62  Pulse 74  Temp 98 F (36.7 C) (Oral)  Resp 15  SpO2 96% General:  Alert, oriented, in no distress. ENT:  TMs and canals normal.  Nasal mucosa normal. Mouth exam:  The tooth in question appears completely normal. It's tender to touch. There was no obvious decay. No swelling of the gingiva, the floor the mouth, or throat. Neck:  No swelling or adenopathy. Lungs:  Breath sounds clear and equal bilaterally.  No wheezes, rales or rhonchi. Heart:  Regular rhythm.  No gallops or murmers. Skin:  Clear, warm and dry.  Course in Urgent Care Center:   After the patient Toradol injection but he declined. In view of his history of  opiate abuse and multiple other prescriptions for opiates and other medications, I elected not to treat him with opiates.  Assessment:  The encounter diagnosis was Dental abscess.  Plan:   1.  The following meds were prescribed:   New Prescriptions   DICLOFENAC (VOLTAREN) 75 MG EC TABLET    Take 1 tablet (75 mg total) by mouth 2 (two) times daily.   PENICILLIN V POTASSIUM (VEETID) 500 MG TABLET    Take 1 tablet (500 mg total) by mouth 4 (four) times daily.   2.  The patient was instructed in symptomatic care and handouts were given. 3.  The patient was told to return if becoming worse in any way, if no better in 3 or 4 days, and given some red flag symptoms that would indicate earlier return, especially difficulty breathing. 4.  The patient was told to follow up with a dentist as soon as possible.    Reuben Likes, MD 12/20/11 [phone]

## 2012-01-26 ENCOUNTER — Emergency Department (HOSPITAL_COMMUNITY)
Admission: EM | Admit: 2012-01-26 | Discharge: 2012-01-26 | Discharge status: Against Medical Advice | Payer: Self-pay | Attending: Emergency Medicine | Admitting: Emergency Medicine

## 2012-01-26 ENCOUNTER — Encounter (HOSPITAL_COMMUNITY): Payer: Self-pay | Admitting: *Deleted

## 2012-01-26 DIAGNOSIS — M545 Low back pain, unspecified: Secondary | ICD-10-CM

## 2012-01-26 DIAGNOSIS — R109 Unspecified abdominal pain: Secondary | ICD-10-CM | POA: Insufficient documentation

## 2012-01-26 DIAGNOSIS — F17200 Nicotine dependence, unspecified, uncomplicated: Secondary | ICD-10-CM | POA: Insufficient documentation

## 2012-01-26 DIAGNOSIS — F909 Attention-deficit hyperactivity disorder, unspecified type: Secondary | ICD-10-CM | POA: Insufficient documentation

## 2012-01-26 HISTORY — DX: Other chronic pain: G89.29

## 2012-01-26 HISTORY — DX: Opioid abuse, uncomplicated: F11.10

## 2012-01-26 LAB — URINALYSIS, ROUTINE W REFLEX MICROSCOPIC
Ketones, ur: NEGATIVE mg/dL
Specific Gravity, Urine: 1.025 (ref 1.005–1.030)
pH: 6 (ref 5.0–8.0)

## 2012-01-26 LAB — URINE MICROSCOPIC-ADD ON

## 2012-01-26 MED ORDER — ONDANSETRON 4 MG PO TBDP: 8.0000 mg | ORAL_TABLET | Freq: Once | ORAL | Status: DC

## 2012-01-26 MED ORDER — ACETAMINOPHEN 500 MG PO TABS: 1000.0000 mg | ORAL_TABLET | Freq: Once | ORAL | Status: DC

## 2012-01-26 MED ORDER — ACETAMINOPHEN 325 MG PO TABS: 975.0000 mg | ORAL_TABLET | Freq: Once | ORAL | Status: DC

## 2012-01-26 MED ORDER — IBUPROFEN 200 MG PO TABS: 400.0000 mg | ORAL_TABLET | Freq: Once | ORAL | Status: DC

## 2012-01-26 NOTE — ED Provider Notes (Signed)
History     CSN: [id number]  Arrival date & time 01/26/12  0446   First MD Initiated Contact with Patient 01/26/12 0505      Chief Complaint  Patient presents with  . Flank Pain    HPI Pt was seen at 0530.  Per pt, c/o gradual onset and persistence of constant left sided low back/flank "pain" for the past 2 days.  Has been associated with nausea.  Pain worsens with palpation of the area and body position changes. Denies incont/retention of bowel or bladder, no saddle anesthesia, no focal motor weakness, no tingling/numbness in extremities, no fevers, no injury, no dysuria/hematuria, no testicular pain/swelling.     Past Medical History  Diagnosis Date  . BPH (benign prostatic hyperplasia)   . Anxiety   . Depression   . ADHD (attention deficit hyperactivity disorder)   . Abdominal pain, recurrent     chronic lower abdominal pain  . Narcotic abuse     opiates  . Chronic pain     Past Surgical History  Procedure Date  . Appendectomy 08/2005    Family History  Problem Relation Age of Onset  . Hypertension Father   . Prostate cancer Other     History  Substance Use Topics  . Smoking status: Former Smoker    Quit date: 07/02/2011  . Smokeless tobacco: Current User    Types: Chew, Snuff  . Alcohol Use: No     quit in 2010    Review of Systems ROS: Statement: All systems negative except as marked or noted in the HPI; Constitutional: Negative for fever and chills. ; ; Eyes: Negative for eye pain, redness and discharge. ; ; ENMT: Negative for ear pain, hoarseness, nasal congestion, sinus pressure and sore throat. ; ; Cardiovascular: Negative for chest pain, palpitations, diaphoresis, dyspnea and peripheral edema. ; ; Respiratory: Negative for cough, wheezing and stridor. ; ; Gastrointestinal: +nausea. Negative for vomiting, diarrhea, abdominal pain, blood in stool, hematemesis, jaundice and rectal bleeding. . ; ; Genitourinary: Negative for dysuria and hematuria. ;   Genital:  No penile drainage or rash, no testicular pain or swelling, no scrotal rash or swelling. ;; Musculoskeletal: +LBP. Negative for neck pain. Negative for swelling and trauma.; ; Skin: Negative for pruritus, rash, abrasions, blisters, bruising and skin lesion.; ; Neuro: Negative for headache, lightheadedness and neck stiffness. Negative for weakness, altered level of consciousness , altered mental status, extremity weakness, paresthesias, involuntary movement, seizure and syncope.     Allergies  Escitalopram oxalate  Home Medications   Current Outpatient Rx  Name Route Sig Dispense Refill  . AMPHETAMINE-DEXTROAMPHETAMINE 20 MG PO TABS Oral Take 20 mg by mouth daily.     Marland Kitchen DICLOFENAC SODIUM 75 MG PO TBEC Oral Take 1 tablet (75 mg total) by mouth 2 (two) times daily. 20 tablet 0  . DOXAZOSIN MESYLATE 2 MG PO TABS Oral Take 2 mg by mouth at bedtime.    . FLUOXETINE HCL 20 MG PO CAPS Oral Take 40 mg by mouth daily.    . IBUPROFEN 400 MG PO TABS Oral Take 400 mg by mouth every 6 (six) hours as needed.    Marland Kitchen PROMETHAZINE HCL 25 MG PO TABS Oral Take 0.5 tablets (12.5 mg total) by mouth every 6 (six) hours as needed for nausea. 10 tablet 0  . TRAMADOL HCL 50 MG PO TABS Oral Take 50 mg by mouth every 6 (six) hours as needed. MOTHERS MED      BP 114/70  Temp 98.2 F (36.8 C) (Oral)  Resp 16  SpO2 98%  Physical Exam 0535: Physical examination:  Nursing notes reviewed; Vital signs and O2 SAT reviewed;  Constitutional: Well developed, Well nourished, Well hydrated, In no acute distress; Head:  Normocephalic, atraumatic; Eyes: EOMI, PERRL, No scleral icterus; ENMT: Mouth and pharynx normal, Mucous membranes moist; Neck: Supple, Full range of motion, No lymphadenopathy; Cardiovascular: Regular rate and rhythm, No murmur, rub, or gallop; Respiratory: Breath sounds clear & equal bilaterally, No rales, rhonchi, wheezes.  Speaking full sentences with ease, Normal respiratory effort/excursion; Chest:  Nontender, Movement normal; Abdomen: Soft, Nontender, Nondistended, Normal bowel sounds; Genitourinary: No CVA tenderness; Spine:  No midline CS, TS, LS tenderness. +TTP left lumbar paraspinal muscles.;; Extremities: Pulses normal, No tenderness, No edema, No calf edema or asymmetry.; Neuro: AA&Ox3, Major CN grossly intact.  Speech clear. Strength 5/5 equal bilat UE's and LE's, including great toe dorsiflexion.  DTR 2/4 equal bilat UE's and LE's.  No gross sensory deficits.  Neg straight leg raises bilat.; Skin: Color normal, Warm, Dry.   ED Course  Procedures   MDM  MDM Reviewed: nursing note, vitals and previous chart Interpretation: labs   0540:  Requests no narcotics has he has hx of abuse and been through detox.   9629:  Pt is no longer in exam room.  CT Tech states he was not in room when she came to get him either.  Apparently left.       Laray Anger, DO 01/27/12 1233

## 2012-01-26 NOTE — ED Notes (Addendum)
C/o L flank/hip pain, also nausea. (denies: injury, radiation, urinary sx, hematuria, dysuria, fever or vd), onset Sunday around noon. Tried applying voltaren gel, ibuprofen and tylenol, "some relief with gel". Last ibuprofen 0200. Last tylenol 1700.

## 2012-01-26 NOTE — ED Notes (Signed)
Pt not in the room no one saw him leave.   /????

## 2012-01-26 NOTE — ED Notes (Signed)
meds not given unable to locate

## 2012-01-27 LAB — URINE CULTURE
Colony Count: NO GROWTH
Culture: NO GROWTH

## 2012-02-27 ENCOUNTER — Emergency Department (HOSPITAL_COMMUNITY)
Admission: EM | Admit: 2012-02-27 | Discharge: 2012-02-27 | Disposition: A | Discharge status: Home / Self Care | Payer: Self-pay | Attending: Emergency Medicine | Admitting: Emergency Medicine

## 2012-02-27 ENCOUNTER — Encounter (HOSPITAL_COMMUNITY): Payer: Self-pay | Admitting: Emergency Medicine

## 2012-02-27 DIAGNOSIS — R110 Nausea: Secondary | ICD-10-CM | POA: Insufficient documentation

## 2012-02-27 DIAGNOSIS — R109 Unspecified abdominal pain: Secondary | ICD-10-CM | POA: Insufficient documentation

## 2012-02-27 LAB — URINALYSIS, ROUTINE W REFLEX MICROSCOPIC
Hgb urine dipstick: NEGATIVE
Specific Gravity, Urine: 1.017 (ref 1.005–1.030)
Urobilinogen, UA: 0.2 mg/dL (ref 0.0–1.0)

## 2012-02-27 LAB — CBC WITH DIFFERENTIAL/PLATELET
Basophils Relative: 0 % (ref 0–1)
Eosinophils Absolute: 0.3 10*3/uL (ref 0.0–0.7)
HCT: 41.3 % (ref 39.0–52.0)
Hemoglobin: 13.9 g/dL (ref 13.0–17.0)
MCH: 32.1 pg (ref 26.0–34.0)
MCHC: 33.7 g/dL (ref 30.0–36.0)
Monocytes Absolute: 0.6 10*3/uL (ref 0.1–1.0)
Monocytes Relative: 7 % (ref 3–12)

## 2012-02-27 LAB — COMPREHENSIVE METABOLIC PANEL
Albumin: 4.1 g/dL (ref 3.5–5.2)
BUN: 13 mg/dL (ref 6–23)
Calcium: 9.6 mg/dL (ref 8.4–10.5)
Creatinine, Ser: 0.73 mg/dL (ref 0.50–1.35)
Total Protein: 7 g/dL (ref 6.0–8.3)

## 2012-02-27 LAB — URINE MICROSCOPIC-ADD ON

## 2012-02-27 NOTE — ED Notes (Signed)
Pt came to RN first and reports that he wants to go home because he needs to take his kids home and wants to come back tomorrow; spoke with pt re: risks of leaving and benefits of staying, still wants to go home

## 2012-02-27 NOTE — ED Notes (Signed)
Reports L side pain X 1 month, has tried multiple meds with no relief; reports if has to strain to use bathroom, it will hurt; hurts with movement as well; reports pain with inspiration--described as stabbing pain; reports intermittent nausea

## 2012-04-05 ENCOUNTER — Emergency Department (HOSPITAL_COMMUNITY)
Admission: EM | Admit: 2012-04-05 | Discharge: 2012-04-05 | Disposition: A | Discharge status: Home / Self Care | Payer: Self-pay | Attending: Emergency Medicine | Admitting: Emergency Medicine

## 2012-04-05 ENCOUNTER — Encounter (HOSPITAL_COMMUNITY): Payer: Self-pay | Admitting: *Deleted

## 2012-04-05 DIAGNOSIS — Z0389 Encounter for observation for other suspected diseases and conditions ruled out: Secondary | ICD-10-CM | POA: Insufficient documentation

## 2012-04-05 LAB — URINALYSIS, ROUTINE W REFLEX MICROSCOPIC
Bilirubin Urine: NEGATIVE
Ketones, ur: NEGATIVE mg/dL
Protein, ur: NEGATIVE mg/dL
Urobilinogen, UA: 0.2 mg/dL (ref 0.0–1.0)

## 2012-04-05 LAB — RAPID URINE DRUG SCREEN, HOSP PERFORMED
Barbiturates: NOT DETECTED
Opiates: NOT DETECTED
Tetrahydrocannabinol: NOT DETECTED

## 2012-04-05 LAB — URINE MICROSCOPIC-ADD ON

## 2012-04-05 NOTE — ED Notes (Signed)
The pt is here for detox from opiates.  He last had oxycodone 30 mg this am.  He is also c/o lt lower back pain

## 2012-04-05 NOTE — ED Provider Notes (Signed)
11:34 PM Patient left the premises before he could be screened by EDP.  Arthor Captain, PA-C 04/05/12 2334

## 2012-04-05 NOTE — ED Provider Notes (Signed)
Medical screening examination/treatment/procedure(s) were performed by non-physician practitioner and as supervising physician I was immediately available for consultation/collaboration.   Dione Booze, MD 04/05/12 [phone]

## 2012-06-18 ENCOUNTER — Emergency Department (INDEPENDENT_AMBULATORY_CARE_PROVIDER_SITE_OTHER)
Admission: EM | Admit: 2012-06-18 | Discharge: 2012-06-18 | Disposition: A | Discharge status: Home / Self Care | Payer: Self-pay | Source: Home / Self Care

## 2012-06-18 ENCOUNTER — Encounter (HOSPITAL_COMMUNITY): Payer: Self-pay

## 2012-06-18 DIAGNOSIS — F329 Major depressive disorder, single episode, unspecified: Secondary | ICD-10-CM

## 2012-06-18 DIAGNOSIS — I10 Essential (primary) hypertension: Secondary | ICD-10-CM

## 2012-06-18 DIAGNOSIS — M545 Low back pain: Secondary | ICD-10-CM

## 2012-06-18 MED ORDER — HYDROCODONE-ACETAMINOPHEN 5-325 MG PO TABS: 1.0000 | ORAL_TABLET | Freq: Four times a day (QID) | ORAL | Status: DC | PRN

## 2012-06-18 MED ORDER — CLONIDINE HCL 0.2 MG PO TABS: 0.2000 mg | ORAL_TABLET | Freq: Two times a day (BID) | ORAL | Status: DC

## 2012-06-18 MED ORDER — FLUOXETINE HCL 20 MG PO CAPS: 40.0000 mg | ORAL_CAPSULE | Freq: Every day | ORAL | Status: DC

## 2012-06-18 MED ORDER — FLUOXETINE HCL 40 MG PO CAPS: 40.0000 mg | ORAL_CAPSULE | Freq: Every day | ORAL | Status: DC

## 2012-06-18 NOTE — ED Notes (Signed)
Medication refills

## 2012-06-18 NOTE — ED Provider Notes (Signed)
History     CSN: [id number]  Arrival date & time 06/18/12  1237  Chief Complaint  Patient presents with  . Medication Refill  HPI Pt reports that he is here to establish care for hypertension.  He reports that he is taking clonidine for his hypertension management.  He is taking   Past Medical History  Diagnosis Date  . BPH (benign prostatic hyperplasia)   . Anxiety   . Depression   . ADHD (attention deficit hyperactivity disorder)   . Abdominal pain, recurrent     chronic lower abdominal pain  . Narcotic abuse     opiates  . Chronic pain     Past Surgical History  Procedure Date  . Appendectomy 08/2005    Family History  Problem Relation Age of Onset  . Hypertension Father   . Prostate cancer Other     History  Substance Use Topics  . Smoking status: Current Every Day Smoker -- 1.0 packs/day  . Smokeless tobacco: Current User    Types: Chew, Snuff  . Alcohol Use: Yes     Comment: occasion    Review of Systems  Constitutional: Negative for chills and diaphoresis.  HENT: Negative for neck stiffness and ear discharge.   Respiratory: Negative for cough and shortness of breath.   Cardiovascular: Negative for chest pain.  Musculoskeletal: Negative for arthralgias.  All other systems reviewed and are negative.    Allergies  Escitalopram oxalate  Home Medications   Current Outpatient Rx  Name  Route  Sig  Dispense  Refill  . AMPHETAMINE-DEXTROAMPHETAMINE 20 MG PO TABS   Oral   Take 20 mg by mouth daily.          Marland Kitchen FLUOXETINE HCL 20 MG PO CAPS   Oral   Take 40 mg by mouth daily.         Marland Kitchen HYDROCODONE-ACETAMINOPHEN 5-325 MG PO TABS   Oral   Take 1 tablet by mouth every 6 (six) hours as needed. For pain          BP 117/64  Pulse 85  Temp 98.5 F (36.9 C) (Oral)  Resp 19  SpO2 99%  Physical Exam  Nursing note and vitals reviewed. Constitutional: He is oriented to person, place, and time. He appears well-developed and well-nourished. No  distress.  HENT:  Head: Normocephalic and atraumatic.  Eyes: EOM are normal. Pupils are equal, round, and reactive to light.  Neck: Normal range of motion. Neck supple.  Cardiovascular: Normal rate, regular rhythm and normal heart sounds.   Pulmonary/Chest: Effort normal and breath sounds normal.  Abdominal: Soft. Bowel sounds are normal.  Musculoskeletal: Normal range of motion.  Neurological: He is alert and oriented to person, place, and time. No cranial nerve deficit.  Skin: Skin is warm and dry.  Psychiatric: He has a normal mood and affect. His behavior is normal. Judgment and thought content normal.    ED Course  Procedures (including critical care time)  Labs Reviewed - No data to display No results found.   No diagnosis found.    MDM  IMPRESSION  Hypertension  Depression  Chronic Pain  RECOMMENDATIONS / PLAN I made a referral for the patient is a chronic pain management clinic.  I refilled his medications for hypertension which seems to control his blood pressure very well.  He's tolerating clonidine.  I explained to him to avoid abrupt withdrawal stopping of clonidine.  He verbalized understanding.  I refilled his medications for depression which she  reports he's doing very well taking fluoxetine.   FOLLOW UP 3 months  The patient was given clear instructions to go to ER or return to medical center if symptoms don't improve, worsen or new problems develop.  The patient verbalized understanding.  The patient was told to call to get lab results if they haven't heard anything in the next week.           Cleora Fleet, MD 06/18/12 2210

## 2012-06-21 ENCOUNTER — Encounter (HOSPITAL_COMMUNITY): Payer: Self-pay | Admitting: *Deleted

## 2012-06-21 ENCOUNTER — Emergency Department (HOSPITAL_COMMUNITY)
Admission: EM | Admit: 2012-06-21 | Discharge: 2012-06-22 | Payer: Self-pay | Attending: Emergency Medicine | Admitting: Emergency Medicine

## 2012-06-21 DIAGNOSIS — F3289 Other specified depressive episodes: Secondary | ICD-10-CM | POA: Insufficient documentation

## 2012-06-21 DIAGNOSIS — Z79899 Other long term (current) drug therapy: Secondary | ICD-10-CM | POA: Insufficient documentation

## 2012-06-21 DIAGNOSIS — F1910 Other psychoactive substance abuse, uncomplicated: Secondary | ICD-10-CM | POA: Insufficient documentation

## 2012-06-21 DIAGNOSIS — F909 Attention-deficit hyperactivity disorder, unspecified type: Secondary | ICD-10-CM | POA: Insufficient documentation

## 2012-06-21 DIAGNOSIS — F17200 Nicotine dependence, unspecified, uncomplicated: Secondary | ICD-10-CM | POA: Insufficient documentation

## 2012-06-21 DIAGNOSIS — Z87448 Personal history of other diseases of urinary system: Secondary | ICD-10-CM | POA: Insufficient documentation

## 2012-06-21 DIAGNOSIS — R52 Pain, unspecified: Secondary | ICD-10-CM | POA: Insufficient documentation

## 2012-06-21 DIAGNOSIS — F411 Generalized anxiety disorder: Secondary | ICD-10-CM | POA: Insufficient documentation

## 2012-06-21 DIAGNOSIS — I10 Essential (primary) hypertension: Secondary | ICD-10-CM | POA: Insufficient documentation

## 2012-06-21 DIAGNOSIS — F329 Major depressive disorder, single episode, unspecified: Secondary | ICD-10-CM | POA: Insufficient documentation

## 2012-06-21 DIAGNOSIS — F1110 Opioid abuse, uncomplicated: Secondary | ICD-10-CM

## 2012-06-21 HISTORY — DX: Essential (primary) hypertension: I10

## 2012-06-21 LAB — COMPREHENSIVE METABOLIC PANEL
ALT: 12 U/L (ref 0–53)
AST: 15 U/L (ref 0–37)
Albumin: 4.1 g/dL (ref 3.5–5.2)
Alkaline Phosphatase: 44 U/L (ref 39–117)
BUN: 14 mg/dL (ref 6–23)
CO2: 28 mEq/L (ref 19–32)
Calcium: 9.3 mg/dL (ref 8.4–10.5)
Chloride: 102 mEq/L (ref 96–112)
Creatinine, Ser: 0.84 mg/dL (ref 0.50–1.35)
GFR calc Af Amer: >90 mL/min (ref >90)
GFR calc non Af Amer: >90 mL/min (ref >90)
Glucose, Bld: 117 mg/dL — ABNORMAL HIGH (ref 70–99)
Potassium: 4.2 mEq/L (ref 3.5–5.1)
Sodium: 138 mEq/L (ref 135–145)
Total Bilirubin: 0.4 mg/dL (ref 0.3–1.2)
Total Protein: 6.9 g/dL (ref 6.0–8.3)

## 2012-06-21 LAB — CBC WITH DIFFERENTIAL/PLATELET
Basophils Absolute: 0 10*3/uL (ref 0.0–0.1)
Eosinophils Absolute: 0.3 10*3/uL (ref 0.0–0.7)
Eosinophils Relative: 3 % (ref 0–5)
HCT: 43.5 % (ref 39.0–52.0)
MCH: 30.5 pg (ref 26.0–34.0)
MCV: 95.6 fL (ref 78.0–100.0)
Monocytes Absolute: 0.4 10*3/uL (ref 0.1–1.0)
Platelets: 216 10*3/uL (ref 150–400)
RDW: 12.7 % (ref 11.5–15.5)

## 2012-06-21 LAB — RAPID URINE DRUG SCREEN, HOSP PERFORMED
Amphetamines: NOT DETECTED
Benzodiazepines: NOT DETECTED
Cocaine: NOT DETECTED

## 2012-06-21 LAB — ETHANOL: Alcohol, Ethyl (B): <11 mg/dL (ref 0–11)

## 2012-06-21 LAB — ACETAMINOPHEN LEVEL: Acetaminophen (Tylenol), Serum: <15 ug/mL (ref 10–30)

## 2012-06-21 MED ORDER — ONDANSETRON HCL 8 MG PO TABS
4.0000 mg | ORAL_TABLET | Freq: Three times a day (TID) | ORAL | Status: DC | PRN
  Administered 2012-06-21: 4 mg via ORAL
  Filled 2012-06-21: qty 1

## 2012-06-21 MED ORDER — NICOTINE 21 MG/24HR TD PT24
21.0000 mg | MEDICATED_PATCH | Freq: Every day | TRANSDERMAL | Status: DC
  Administered 2012-06-21 – 2012-06-22 (×2): 21 mg via TRANSDERMAL
  Filled 2012-06-21 (×2): qty 1

## 2012-06-21 MED ORDER — CLONIDINE HCL 0.1 MG PO TABS
0.1000 mg | ORAL_TABLET | Freq: Two times a day (BID) | ORAL | Status: DC
  Administered 2012-06-21 – 2012-06-22 (×2): 0.1 mg via ORAL
  Filled 2012-06-21 (×2): qty 1

## 2012-06-21 MED ORDER — IBUPROFEN 200 MG PO TABS
600.0000 mg | ORAL_TABLET | Freq: Three times a day (TID) | ORAL | Status: DC | PRN
  Administered 2012-06-21: 600 mg via ORAL
  Filled 2012-06-21: qty 3

## 2012-06-21 MED ORDER — CLONIDINE HCL 0.1 MG PO TABS: 0.1000 mg | ORAL_TABLET | Freq: Once | ORAL | Status: DC

## 2012-06-21 MED ORDER — ACETAMINOPHEN 325 MG PO TABS: 650.0000 mg | ORAL_TABLET | ORAL | Status: DC | PRN

## 2012-06-21 NOTE — ED Notes (Signed)
Report given to Audrey.

## 2012-06-21 NOTE — ED Notes (Signed)
Cell Phone given to PT Mother at his request.

## 2012-06-21 NOTE — ED Provider Notes (Addendum)
History   This chart was scribed for Charles B. Bernette Mayers, MD, by Frederik Pear, ER scribe. The patient was seen in room TR10C/TR10C and the patient's care was started at 1127.    CSN: [id number]  Arrival date & time 06/21/12  1118   First MD Initiated Contact with Patient 06/21/12 1127      Chief Complaint  Patient presents with  . Medical Clearance    (Consider location/radiation/quality/duration/timing/severity/associated sxs/prior treatment) HPI  [name] is a 36 y.o. male who presents to the Emergency Department medical clearance from prescription opiates for the past 5 months. He denies any SI or HI. He reports that his last inpatient treatment was in May at [facility], but has a h/o of several inpatient treatments at facilities including [facility]. He states that he has a bed at [facility] in 3 days, but most detox first. He states that he last used last night and is experiencing withdrawal symptoms today including body aches and anxiousness. He reports that he currently takes clonidine for [facility] and has taken his prescribed dose today. He denies using any streeet drugs or ETOH.     Past Medical History  Diagnosis Date  . BPH (benign prostatic hyperplasia)   . Anxiety   . Depression   . ADHD (attention deficit hyperactivity disorder)   . Abdominal pain, recurrent     chronic lower abdominal pain  . Narcotic abuse     opiates  . Chronic pain   . Hypertension     Past Surgical History  Procedure Date  . Appendectomy 08/2005    Family History  Problem Relation Age of Onset  . Hypertension Father   . Prostate cancer Other     History  Substance Use Topics  . Smoking status: Current Every Day Smoker -- 1.0 packs/day  . Smokeless tobacco: Current User    Types: Chew, Snuff  . Alcohol Use: Yes     Comment: occasion      Review of Systems A complete 10 system review of systems was obtained and all systems are negative except as noted in the HPI and PMH.  Allergies   Escitalopram oxalate  Home Medications   Current Outpatient Rx  Name  Route  Sig  Dispense  Refill  . CLONIDINE HCL 0.2 MG PO TABS   Oral   Take 1 tablet (0.2 mg total) by mouth 2 (two) times daily.   60 tablet   4   . FLUOXETINE HCL 40 MG PO CAPS   Oral   Take 1 capsule (40 mg total) by mouth daily.   30 capsule   4   . HYDROCODONE-ACETAMINOPHEN 5-325 MG PO TABS   Oral   Take 1 tablet by mouth every 6 (six) hours as needed for pain. For pain   30 tablet   0     BP 98/53  Pulse 81  Temp 97.9 F (36.6 C) (Oral)  Resp 18  SpO2 100%  Physical Exam  Constitutional: He is oriented to person, place, and time. He appears well-developed and well-nourished.  HENT:  Head: Normocephalic and atraumatic.  Neck: Neck supple.  Pulmonary/Chest: Effort normal.  Neurological: He is alert and oriented to person, place, and time. No cranial nerve deficit.  Psychiatric: He has a normal mood and affect. His behavior is normal.    ED Course  Procedures (including critical care time)  DIAGNOSTIC STUDIES: Oxygen Saturation is 100% on room air, normal by my interpretation.    COORDINATION OF CARE:  11:40- Discussed planned course of treatment with the patient, including blood work, who is agreeable at this time.   Labs Reviewed  SALICYLATE LEVEL - Abnormal; Notable for the following:    Salicylate Lvl <2.0 (*)     All other components within normal limits  COMPREHENSIVE METABOLIC PANEL - Abnormal; Notable for the following:    Glucose, Bld 117 (*)     All other components within normal limits  URINE RAPID DRUG SCREEN (HOSP PERFORMED) - Abnormal; Notable for the following:    Opiates POSITIVE (*)     All other components within normal limits  ETHANOL  ACETAMINOPHEN LEVEL  CBC WITH DIFFERENTIAL   No results found.   No diagnosis found.    MDM  Pt with chronic narcotic abuse, reports a bed available at [facility] after detox. Asking for placement in Detox, he has been  multiple times before. No active withdrawal symptoms. Moved to [facility].   I personally performed the services described in this documentation, which was scribed in my presence. The recorded information has been reviewed and is accurate.        Charles B. Bernette Mayers, MD 06/21/12 1344  Charles B. Bernette Mayers, MD 07/05/12 [phone]

## 2012-06-21 NOTE — BH Assessment (Signed)
Assessment Note   [name] is an 36 y.o. male that presented on his own to request detox for Oxycodone Dependence.  Pt reports taking up to 25 pills daily for last several months.  Pt reports that he has a treatment bed at [facility] on Thursday, but they were requesting that he be detoxed first.  Pt states that his current withdrawals "feel like flu-like symptoms."  Pt is achey and reports chills and fever-like symptoms".  His COWS is a 4. Pt reports previous treatment at [facility] in May, and before that at [facility].  Pt denies any hx of MH treatment or being on any psychiatric medications.  Pt denies any currently legal charges.  Pt lives with his supportive girlfriend and is currently able to contract for safety.  Pt denies any SI or HI or any psychotic symptoms.  Axis I: Opioid Dependence Axis II: Deferred Axis III:  Past Medical History  Diagnosis Date  . BPH (benign prostatic hyperplasia)   . Anxiety   . Depression   . ADHD (attention deficit hyperactivity disorder)   . Abdominal pain, recurrent     chronic lower abdominal pain  . Narcotic abuse     opiates  . Chronic pain   . Hypertension    Axis IV: other psychosocial or environmental problems, problems related to social environment and problems with access to health care services Axis V: 41-50 serious symptoms  Past Medical History:  Past Medical History  Diagnosis Date  . BPH (benign prostatic hyperplasia)   . Anxiety   . Depression   . ADHD (attention deficit hyperactivity disorder)   . Abdominal pain, recurrent     chronic lower abdominal pain  . Narcotic abuse     opiates  . Chronic pain   . Hypertension     Past Surgical History  Procedure Date  . Appendectomy 08/2005    Family History:  Family History  Problem Relation Age of Onset  . Hypertension Father   . Prostate cancer Other     Social History:  reports that he has been smoking.  His smokeless tobacco use includes Chew and Snuff. He reports that he  drinks alcohol. He reports that he uses illicit drugs (Oxycodone and Opium).  Additional Social History:  Alcohol / Drug Use Pain Medications: See MAR- not prescribed Prescriptions: See MAR Over the Counter: See MAR History of alcohol / drug use?: Yes Substance #1 Name of Substance 1: Opiates 1 - Age of First Use: 28 1 - Amount (size/oz): up to 25 Oxycodone 1 - Frequency: QD 1 - Duration: since May 1 - Last Use / Amount: last night- 12/22  CIWA: CIWA-Ar BP: 90/50 mmHg Pulse Rate: 65  COWS: Clinical Opiate Withdrawal Scale (COWS) Resting Pulse Rate: Pulse Rate 80 or below Sweating: Subjective report of chills or flushing Restlessness: Able to sit still Pupil Size: Pupils pinned or normal size for room light Bone or Joint Aches: Patient reports sever diffuse aching of joints/muscles Runny Nose or Tearing: Not present GI Upset: Stomach cramps Tremor: No tremor Yawning: No yawning Anxiety or Irritability: None Gooseflesh Skin: Skin is smooth COWS Total Score: 4   Allergies:  Allergies  Allergen Reactions  . Escitalopram Oxalate Hives    Home Medications:  (Not in a hospital admission)  OB/GYN Status:  No LMP for male patient.  General Assessment Data Location of Assessment: [facility] ED ACT Assessment: Yes Living Arrangements: Spouse/significant other Can pt return to current living arrangement?: Yes Admission Status: Voluntary  Is patient capable of signing voluntary admission?: Yes Transfer from: Acute Hospital Referral Source: Self/Family/Friend  Education Status Is patient currently in school?: No  Risk to self Suicidal Ideation: No Suicidal Intent: No Is patient at risk for suicide?: No Suicidal Plan?: No Access to Means: No What has been your use of drugs/alcohol within the last 12 months?: Oxycodone Previous Attempts/Gestures: No How many times?: 0  Other Self Harm Risks: damaging and reckless Triggers for Past Attempts: Unpredictable Intentional Self  Injurious Behavior: Damaging Comment - Self Injurious Behavior: ongoing SA use Family Suicide History: No Recent stressful life event(s): Recent negative physical changes;Turmoil (Comment) Persecutory voices/beliefs?: No Depression: Yes Depression Symptoms: Guilt;Loss of interest in usual pleasures;Feeling worthless/self pity Substance abuse history and/or treatment for substance abuse?: Yes Suicide prevention information given to non-admitted patients: Not applicable  Risk to Others Homicidal Ideation: No Thoughts of Harm to Others: No Current Homicidal Intent: No Current Homicidal Plan: No Access to Homicidal Means: No Identified Victim: none per pt History of harm to others?: No Assessment of Violence: None Noted Violent Behavior Description: none noted Does patient have access to weapons?: No Criminal Charges Pending?: No Does patient have a court date: No  Psychosis Hallucinations: None noted Delusions: None noted  Mental Status Report Appear/Hygiene: Other (Comment) (appropriate) Eye Contact: Good Motor Activity: Unremarkable Speech: Logical/coherent Level of Consciousness: Quiet/awake Mood: Apathetic;Empty Affect: Apathetic;Appropriate to circumstance;Blunted Anxiety Level: Minimal Thought Processes: Relevant Judgement: Impaired Orientation: Person;Place;Time;Situation Obsessive Compulsive Thoughts/Behaviors: Severe  Cognitive Functioning Concentration: Decreased Memory: Recent Intact;Remote Intact IQ: Average Insight: Poor Impulse Control: Poor Appetite: Poor Weight Loss:  ("I don't think so") Weight Gain: 0  Sleep: No Change Total Hours of Sleep:  (5) Vegetative Symptoms: None  ADLScreening [facility] Assessment Services) Patient's cognitive ability adequate to safely complete daily activities?: Yes Patient able to express need for assistance with ADLs?: Yes Independently performs ADLs?: Yes (appropriate for developmental age)  Abuse/Neglect  [facility]) Physical Abuse: Denies Verbal Abuse: Denies Sexual Abuse: Denies  Prior Inpatient Therapy Prior Inpatient Therapy: Yes Prior Therapy Dates: May '13 and prior Prior Therapy Facilty/Provider(s): [facility], [facility], and others Reason for Treatment: detox and  SA treatment  Prior Outpatient Therapy Prior Outpatient Therapy: Yes Prior Therapy Dates: currently Prior Therapy Facilty/Provider(s): [facility] Reason for Treatment: Opioid Dependence  ADL Screening (condition at time of admission) Patient's cognitive ability adequate to safely complete daily activities?: Yes Patient able to express need for assistance with ADLs?: Yes Independently performs ADLs?: Yes (appropriate for developmental age)       Abuse/Neglect Assessment (Assessment to be complete while patient is alone) Physical Abuse: Denies Verbal Abuse: Denies Sexual Abuse: Denies Exploitation of patient/patient's resources: Denies Self-Neglect: Denies Values / Beliefs Cultural Requests During Hospitalization: None Spiritual Requests During Hospitalization: None   Advance Directives (For Healthcare) Advance Directive: Patient does not have advance directive    Additional Information 1:1 In Past 12 Months?: No CIRT Risk: No Elopement Risk: No Does patient have medical clearance?: Yes     Disposition:  Referred to [facility]. Disposition Disposition of Patient: Referred to Patient referred to: [facility]  On Site Evaluation by:   Reviewed with Physician:     Angelica Ran 06/21/2012 2:59 PM

## 2012-06-21 NOTE — ED Notes (Signed)
Pt is here for help to detox from opiates (oxycodone).  Last time used was yesterday.  Pt denies SI or HI.

## 2012-06-22 MED ORDER — LORAZEPAM 1 MG PO TABS
1.0000 mg | ORAL_TABLET | Freq: Two times a day (BID) | ORAL | Status: DC
  Administered 2012-06-22: 1 mg via ORAL
  Filled 2012-06-22: qty 1

## 2012-06-22 NOTE — BH Assessment (Signed)
Assessment Note   [name] is an 36 y.o. male that was reassessed this day.  Pt requesting to go to detox for opiates.  Pt stated he has an appt with [facility] on this Thursday.  Pt denies current withdrawal symptoms.  Pt denies SI, HI or psychosis.  Pt is pending [facility].  Completed reassessment, assessment notification and faxed to [facility] to log.  Previous Note:  [name] is an 36 y.o. male that presented on his own to request detox for Oxycodone Dependence. Pt reports taking up to 25 pills daily for last several months. Pt reports that he has a treatment bed at [facility] on Thursday, but they were requesting that he be detoxed first. Pt states that his current withdrawals "feel like flu-like symptoms." Pt is achey and reports chills and fever-like symptoms". His COWS is a 4. Pt reports previous treatment at [facility] in May, and before that at [facility]. Pt denies any hx of MH treatment or being on any psychiatric medications. Pt denies any currently legal charges. Pt lives with his supportive girlfriend and is currently able to contract for safety. Pt denies any SI or HI or any psychotic symptoms.      Axis I: 304.00 Opioid Dependence Axis II: Deferred Axis III:  Past Medical History  Diagnosis Date  . BPH (benign prostatic hyperplasia)   . Anxiety   . Depression   . ADHD (attention deficit hyperactivity disorder)   . Abdominal pain, recurrent     chronic lower abdominal pain  . Narcotic abuse     opiates  . Chronic pain   . Hypertension    Axis IV: other psychosocial or environmental problems and problems with access to health care services Axis V: 41-50 serious symptoms  Past Medical History:  Past Medical History  Diagnosis Date  . BPH (benign prostatic hyperplasia)   . Anxiety   . Depression   . ADHD (attention deficit hyperactivity disorder)   . Abdominal pain, recurrent     chronic lower abdominal pain  . Narcotic abuse     opiates  . Chronic pain   . Hypertension      Past Surgical History  Procedure Date  . Appendectomy 08/2005    Family History:  Family History  Problem Relation Age of Onset  . Hypertension Father   . Prostate cancer Other     Social History:  reports that he has been smoking.  His smokeless tobacco use includes Chew and Snuff. He reports that he drinks alcohol. He reports that he uses illicit drugs (Oxycodone and Opium).  Additional Social History:  Alcohol / Drug Use Pain Medications: see MAR Prescriptions: see MAR Over the Counter: see MAR History of alcohol / drug use?: Yes Longest period of sobriety (when/how long): unknown Negative Consequences of Use: Personal relationships Withdrawal Symptoms:  (pt denies) Substance #1 Name of Substance 1: Opiates 1 - Age of First Use: 28 1 - Amount (size/oz): up to 25 Oxycodone 1 - Frequency: QD 1 - Duration: since May 1 - Last Use / Amount: last night- 12/22  CIWA: CIWA-Ar BP: 103/65 mmHg Pulse Rate: 72  COWS: Clinical Opiate Withdrawal Scale (COWS) Resting Pulse Rate: Pulse Rate 80 or below Sweating: Flushed or Observable moistness on face Restlessness: Able to sit still Pupil Size: Pupils possibly larger than normal for room light Bone or Joint Aches: Not present Runny Nose or Tearing: Not present GI Upset: No GI symptoms Tremor: No tremor Yawning: No yawning Anxiety or Irritability:  None Gooseflesh Skin: Skin is smooth COWS Total Score: 3   Allergies:  Allergies  Allergen Reactions  . Escitalopram Oxalate Hives    Home Medications:  (Not in a hospital admission)  OB/GYN Status:  No LMP for male patient.  General Assessment Data Location of Assessment: [facility] ED ACT Assessment: Yes Living Arrangements: Spouse/significant other Can pt return to current living arrangement?: Yes Admission Status: Voluntary Is patient capable of signing voluntary admission?: Yes Transfer from: [facility] Referral Source: Self/Family/Friend  Education Status Is  patient currently in school?: No  Risk to self Suicidal Ideation: No Suicidal Intent: No Is patient at risk for suicide?: No Suicidal Plan?: No Access to Means: No What has been your use of drugs/alcohol within the last 12 months?: Oxycodone Previous Attempts/Gestures: No How many times?: 0  Other Self Harm Risks: damaging and reckless Triggers for Past Attempts: Unpredictable Intentional Self Injurious Behavior: Damaging Comment - Self Injurious Behavior: ongoing SA use Family Suicide History: No Recent stressful life event(s): Recent negative physical changes;Turmoil (Comment) Persecutory voices/beliefs?: No Depression: Yes Depression Symptoms: Guilt;Loss of interest in usual pleasures;Feeling worthless/self pity Substance abuse history and/or treatment for substance abuse?: No Suicide prevention information given to non-admitted patients: Not applicable  Risk to Others Homicidal Ideation: No Thoughts of Harm to Others: No Current Homicidal Intent: No Current Homicidal Plan: No Access to Homicidal Means: No Identified Victim: none per pt History of harm to others?: No Assessment of Violence: None Noted Violent Behavior Description: na - pt calm, cooperative Does patient have access to weapons?: No Criminal Charges Pending?: No Does patient have a court date: No  Psychosis Hallucinations: None noted Delusions: None noted  Mental Status Report Appear/Hygiene: Other (Comment) (casual in scrubs) Eye Contact: Good Motor Activity: Unremarkable Speech: Logical/coherent Level of Consciousness: Quiet/awake Mood: Apathetic Affect: Apathetic Anxiety Level: Minimal Thought Processes: Relevant Judgement: Impaired Orientation: Person;Place;Time;Situation Obsessive Compulsive Thoughts/Behaviors: None  Cognitive Functioning Concentration: Decreased Memory: Recent Intact;Remote Intact IQ: Average Insight: Poor Impulse Control: Poor Appetite: Poor Weight Loss:  ("I don't  think so") Weight Gain: 0  Sleep: No Change Total Hours of Sleep:  (5) Vegetative Symptoms: None  ADLScreening [facility] Assessment Services) Patient's cognitive ability adequate to safely complete daily activities?: Yes Patient able to express need for assistance with ADLs?: Yes Independently performs ADLs?: Yes (appropriate for developmental age)  Abuse/Neglect [facility]) Physical Abuse: Denies Verbal Abuse: Denies Sexual Abuse: Denies  Prior Inpatient Therapy Prior Inpatient Therapy: Yes Prior Therapy Dates: May '13 and prior Prior Therapy Facilty/Provider(s): [facility], [facility], and others Reason for Treatment: detox and  SA treatment  Prior Outpatient Therapy Prior Outpatient Therapy: Yes Prior Therapy Dates: currently Prior Therapy Facilty/Provider(s): [facility] Reason for Treatment: Opioid Dependence  ADL Screening (condition at time of admission) Patient's cognitive ability adequate to safely complete daily activities?: Yes Patient able to express need for assistance with ADLs?: Yes Independently performs ADLs?: Yes (appropriate for developmental age) Weakness of Legs: None Weakness of Arms/Hands: None  Home Assistive Devices/Equipment Home Assistive Devices/Equipment: None    Abuse/Neglect Assessment (Assessment to be complete while patient is alone) Physical Abuse: Denies Verbal Abuse: Denies Sexual Abuse: Denies Exploitation of patient/patient's resources: Denies Self-Neglect: Denies Values / Beliefs Cultural Requests During Hospitalization: None Spiritual Requests During Hospitalization: None Consults Spiritual Care Consult Needed: No Social Work Consult Needed: No Merchant navy officer (For Healthcare) Advance Directive: Patient does not have advance directive;Patient would not like information    Additional Information 1:1 In Past 12 Months?: No CIRT Risk: No  Elopement Risk: No Does patient have medical clearance?: Yes     Disposition:  Disposition Disposition  of Patient: Referred to Patient referred to: [facility]  On Site Evaluation by:   Reviewed with Physician:     Caryl Comes 06/22/2012 9:16 AM

## 2012-06-22 NOTE — BH Assessment (Signed)
Assessment Note  Update:  Called [facility] to follow up with pt referral.  Completed prescreen wit Olegario Messier @ [phone] and received a call back stating pt accepted @ 1500 and that [facility] would pick pt up from [facility].  Updated EDP Zackowski and ED staff.  Updated assessment disposition, completed assessment notification and faxed to [facility] to log.  Pt to be discharged to [facility].     Disposition:  Disposition Disposition of Patient: Inpatient treatment program Type of inpatient treatment program: Adult Patient referred to: [facility]  On Site Evaluation by:   Reviewed with Physician:  Delorse Limber, Rennis Harding 06/22/2012 3:48 PM

## 2012-07-29 ENCOUNTER — Emergency Department (HOSPITAL_COMMUNITY)
Admission: EM | Admit: 2012-07-29 | Discharge: 2012-07-29 | Disposition: A | Discharge status: Home / Self Care | Payer: Self-pay | Attending: Emergency Medicine | Admitting: Emergency Medicine

## 2012-07-29 ENCOUNTER — Encounter (HOSPITAL_COMMUNITY): Payer: Self-pay

## 2012-07-29 ENCOUNTER — Emergency Department (HOSPITAL_COMMUNITY): Payer: Self-pay

## 2012-07-29 DIAGNOSIS — Z79899 Other long term (current) drug therapy: Secondary | ICD-10-CM | POA: Insufficient documentation

## 2012-07-29 DIAGNOSIS — R109 Unspecified abdominal pain: Secondary | ICD-10-CM

## 2012-07-29 DIAGNOSIS — Z8659 Personal history of other mental and behavioral disorders: Secondary | ICD-10-CM | POA: Insufficient documentation

## 2012-07-29 DIAGNOSIS — R1011 Right upper quadrant pain: Secondary | ICD-10-CM | POA: Insufficient documentation

## 2012-07-29 DIAGNOSIS — R110 Nausea: Secondary | ICD-10-CM | POA: Insufficient documentation

## 2012-07-29 DIAGNOSIS — I10 Essential (primary) hypertension: Secondary | ICD-10-CM | POA: Insufficient documentation

## 2012-07-29 DIAGNOSIS — F1110 Opioid abuse, uncomplicated: Secondary | ICD-10-CM | POA: Insufficient documentation

## 2012-07-29 DIAGNOSIS — G8929 Other chronic pain: Secondary | ICD-10-CM | POA: Insufficient documentation

## 2012-07-29 DIAGNOSIS — N400 Enlarged prostate without lower urinary tract symptoms: Secondary | ICD-10-CM | POA: Insufficient documentation

## 2012-07-29 DIAGNOSIS — R630 Anorexia: Secondary | ICD-10-CM | POA: Insufficient documentation

## 2012-07-29 DIAGNOSIS — F909 Attention-deficit hyperactivity disorder, unspecified type: Secondary | ICD-10-CM | POA: Insufficient documentation

## 2012-07-29 DIAGNOSIS — F3289 Other specified depressive episodes: Secondary | ICD-10-CM | POA: Insufficient documentation

## 2012-07-29 DIAGNOSIS — F329 Major depressive disorder, single episode, unspecified: Secondary | ICD-10-CM | POA: Insufficient documentation

## 2012-07-29 LAB — URINALYSIS, ROUTINE W REFLEX MICROSCOPIC
Bilirubin Urine: NEGATIVE
Hgb urine dipstick: NEGATIVE
Nitrite: NEGATIVE
Protein, ur: NEGATIVE mg/dL
Specific Gravity, Urine: 1.017 (ref 1.005–1.030)
Urobilinogen, UA: 0.2 mg/dL (ref 0.0–1.0)

## 2012-07-29 LAB — CBC WITH DIFFERENTIAL/PLATELET
Basophils Absolute: 0 10*3/uL (ref 0.0–0.1)
Basophils Relative: 0 % (ref 0–1)
Eosinophils Absolute: 0.2 10*3/uL (ref 0.0–0.7)
MCH: 32 pg (ref 26.0–34.0)
MCHC: 33.7 g/dL (ref 30.0–36.0)
Neutro Abs: 5.5 10*3/uL (ref 1.7–7.7)
Neutrophils Relative %: 73 % (ref 43–77)
RDW: 12.9 % (ref 11.5–15.5)

## 2012-07-29 LAB — COMPREHENSIVE METABOLIC PANEL
ALT: 11 U/L (ref 0–53)
CO2: 23 mEq/L (ref 19–32)
Calcium: 9 mg/dL (ref 8.4–10.5)
Creatinine, Ser: 0.76 mg/dL (ref 0.50–1.35)
GFR calc Af Amer: >90 mL/min (ref >90)
GFR calc non Af Amer: >90 mL/min (ref >90)
Glucose, Bld: 110 mg/dL — ABNORMAL HIGH (ref 70–99)
Sodium: 136 mEq/L (ref 135–145)
Total Protein: 6.7 g/dL (ref 6.0–8.3)

## 2012-07-29 LAB — LIPASE, BLOOD: Lipase: 14 U/L (ref 11–59)

## 2012-07-29 MED ORDER — HYDROMORPHONE HCL PF 1 MG/ML IJ SOLN
1.0000 mg | Freq: Once | INTRAMUSCULAR | Status: AC
  Administered 2012-07-29: 1 mg via INTRAVENOUS
  Filled 2012-07-29: qty 1

## 2012-07-29 MED ORDER — ONDANSETRON HCL 4 MG/2ML IJ SOLN
4.0000 mg | Freq: Once | INTRAMUSCULAR | Status: AC
  Administered 2012-07-29: 4 mg via INTRAVENOUS
  Filled 2012-07-29: qty 2

## 2012-07-29 MED ORDER — TRAMADOL HCL 50 MG PO TABS: 50.0000 mg | ORAL_TABLET | Freq: Four times a day (QID) | ORAL | Status: DC | PRN

## 2012-07-29 MED ORDER — SODIUM CHLORIDE 0.9 % IV BOLUS (SEPSIS)
1000.0000 mL | Freq: Once | INTRAVENOUS | Status: AC
  Administered 2012-07-29: 1000 mL via INTRAVENOUS

## 2012-07-29 NOTE — Discharge Instructions (Signed)
As discussed, your abdominal pain may be due to spasm of the gallbladder.  It is important that you followup with your primary care physician and a surgeon for further evaluation and management of his condition.  If you develop any new, or concerning changes in your condition, please return to the emergency department.Abdominal Pain Abdominal pain can be caused by many things. Your caregiver decides the seriousness of your pain by an examination and possibly blood tests and X-rays. Many cases can be observed and treated at home. Most abdominal pain is not caused by a disease and will probably improve without treatment. However, in many cases, more time must pass before a clear cause of the pain can be found. Before that point, it may not be known if you need more testing, or if hospitalization or surgery is needed. HOME CARE INSTRUCTIONS   Do not take laxatives unless directed by your caregiver.  Take pain medicine only as directed by your caregiver.  Only take over-the-counter or prescription medicines for pain, discomfort, or fever as directed by your caregiver.  Try a clear liquid diet (broth, tea, or water) for as long as directed by your caregiver. Slowly move to a bland diet as tolerated. SEEK IMMEDIATE MEDICAL CARE IF:   The pain does not go away.  You have a fever.  You keep throwing up (vomiting).  The pain is felt only in portions of the abdomen. Pain in the right side could possibly be appendicitis. In an adult, pain in the left lower portion of the abdomen could be colitis or diverticulitis.  You pass bloody or black tarry stools. MAKE SURE YOU:   Understand these instructions.  Will watch your condition.  Will get help right away if you are not doing well or get worse. Document Released: 03/26/2005 Document Revised: 09/08/2011 Document Reviewed: 02/02/2008 [facility] Patient Information 2013 [place], [place].

## 2012-07-29 NOTE — Progress Notes (Signed)
Pt reported to have been seen by [facility] Liaison on 07/23/12 Erie Noe) CM, ED RN and Liaison spoke with EDP Jeraldine Loots about these concerns

## 2012-07-29 NOTE — Progress Notes (Signed)
CM noted no pcp Pt confirms no pcp nor insurance coverage Cm provided written resources for self pay pcps, medications and health reform information for [place] residents

## 2012-07-29 NOTE — ED Notes (Signed)
Patient c/o right upper abdominal pain and nausea. Patient states that he was told by his urologist that he had gallstones. Patient denies vomiting, fever or diarrhea.

## 2012-07-29 NOTE — ED Provider Notes (Signed)
History     CSN: [id number]  Arrival date & time 07/29/12  1028   First MD Initiated Contact with Patient 07/29/12 1113      Chief Complaint  Patient presents with  . Abdominal Pain    (Consider location/radiation/quality/duration/timing/severity/associated sxs/prior treatment) HPI The patient presents with abdominal pain.  The pain is right upper quadrant.  The pain has been present for one week, with no clear precipitant prior to onset.  Since onset the pain has been persistent, sore, with associated diffuse radiation.  There is also new anorexia, nausea, no vomiting, no diarrhea. Patient has a history of BPH, appendectomy, no prior gallbladder issues. Minimal relief with OTC medication.  Past Medical History  Diagnosis Date  . BPH (benign prostatic hyperplasia)   . Anxiety   . Depression   . ADHD (attention deficit hyperactivity disorder)   . Abdominal pain, recurrent     chronic lower abdominal pain  . Narcotic abuse     opiates  . Chronic pain   . Hypertension     Past Surgical History  Procedure Date  . Appendectomy 08/2005    Family History  Problem Relation Age of Onset  . Hypertension Father   . Prostate cancer Other     History  Substance Use Topics  . Smoking status: Current Every Day Smoker -- 0.5 packs/day    Types: Cigarettes  . Smokeless tobacco: Never Used  . Alcohol Use: Yes     Comment: occasion      Review of Systems  Constitutional:       Per HPI, otherwise negative  HENT:       Per HPI, otherwise negative  Eyes: Negative.   Respiratory:       Per HPI, otherwise negative  Cardiovascular:       Per HPI, otherwise negative  Gastrointestinal: Positive for nausea and abdominal pain. Negative for vomiting and diarrhea.  Genitourinary: Negative.   Musculoskeletal:       Per HPI, otherwise negative  Skin: Negative.   Neurological: Negative for syncope.    Allergies  Escitalopram oxalate  Home Medications   Current Outpatient  Rx  Name  Route  Sig  Dispense  Refill  . CLONIDINE HCL 0.2 MG PO TABS   Oral   Take 1 tablet (0.2 mg total) by mouth 2 (two) times daily.   60 tablet   4   . FLUOXETINE HCL 40 MG PO CAPS   Oral   Take 1 capsule (40 mg total) by mouth daily.   30 capsule   4   . HYDROCODONE-ACETAMINOPHEN 5-325 MG PO TABS   Oral   Take 1 tablet by mouth every 6 (six) hours as needed for pain. For pain   30 tablet   0     BP 101/58  Pulse 82  Temp 98.4 F (36.9 C) (Oral)  Resp 20  SpO2 100%  Physical Exam  Nursing note and vitals reviewed. Constitutional: He is oriented to person, place, and time. He appears well-developed. No distress.  HENT:  Head: Normocephalic and atraumatic.  Eyes: Conjunctivae normal and EOM are normal.  Cardiovascular: Normal rate and regular rhythm.   Pulmonary/Chest: Effort normal. No stridor. No respiratory distress.  Abdominal: He exhibits no distension. There is generalized tenderness.       Patient is guarding with all abdominal palpation, the abdomen is non-peritoneal, with percussion. There is an increased degree of pain in the RUQ  Musculoskeletal: He exhibits no edema.  Neurological: He  is alert and oriented to person, place, and time.  Skin: Skin is warm and dry.  Psychiatric: He has a normal mood and affect.    ED Course  Procedures (including critical care time)   Labs Reviewed  CBC WITH DIFFERENTIAL  URINALYSIS, ROUTINE W REFLEX MICROSCOPIC  LIPASE, BLOOD  COMPREHENSIVE METABOLIC PANEL   No results found.   No diagnosis found.    MDM  This young male presents with ongoing right upper quadrant pain.  Patient describes a recent evaluation for other complaints with mention of gallstones.  On exam he has tenderness, most prominently in the right upper quadrant.  Given these concerns, there suspicion of cholecystitis.  Ultrasound demonstrated dilated common bile duct, but no evidence of acute cholecystitis, presence of gallstones.  Given  these findings, there suspicion for biliary colic.  The patient was discharged in stable condition to follow up with surgery promptly.  The patient requested narcotics, this was deferred for fear of exacerbating spasm.  Gerhard Munch, MD 07/29/12 1451

## 2012-08-03 ENCOUNTER — Emergency Department (INDEPENDENT_AMBULATORY_CARE_PROVIDER_SITE_OTHER)
Admission: EM | Admit: 2012-08-03 | Discharge: 2012-08-03 | Disposition: A | Discharge status: Home / Self Care | Payer: Self-pay | Source: Home / Self Care | Attending: Family Medicine | Admitting: Family Medicine

## 2012-08-03 ENCOUNTER — Encounter (HOSPITAL_COMMUNITY): Payer: Self-pay

## 2012-08-03 DIAGNOSIS — F1120 Opioid dependence, uncomplicated: Secondary | ICD-10-CM

## 2012-08-03 DIAGNOSIS — F329 Major depressive disorder, single episode, unspecified: Secondary | ICD-10-CM

## 2012-08-03 DIAGNOSIS — G8929 Other chronic pain: Secondary | ICD-10-CM

## 2012-08-03 DIAGNOSIS — I10 Essential (primary) hypertension: Secondary | ICD-10-CM

## 2012-08-03 MED ORDER — CLONIDINE HCL 0.2 MG PO TABS: 0.2000 mg | ORAL_TABLET | Freq: Two times a day (BID) | ORAL | Status: DC

## 2012-08-03 MED ORDER — FLUOXETINE HCL 20 MG PO CAPS: ORAL_CAPSULE | ORAL | Status: DC

## 2012-08-03 MED ORDER — HYDROCODONE-ACETAMINOPHEN 5-325 MG PO TABS: 1.0000 | ORAL_TABLET | Freq: Four times a day (QID) | ORAL | Status: DC | PRN

## 2012-08-03 NOTE — ED Provider Notes (Signed)
History     CSN: [id number]  Arrival date & time 08/03/12  1506   First MD Initiated Contact with Patient 08/03/12 1519      Chief Complaint  Patient presents with  . Medication Refill   HPI Pt is reporting that he has cut down on his opioid use and only takes 1 tablet before bed for his chronic pain issues.  He has been suffering from chronic pain and has developed opiod dependence.  This has gone on for a very long time.  He has been seen recently by [facility] and has been to [facility] facility for assistance.  He is requesting to have his dose of prozac increased today as he still does have lingering depression but denies suicidal thoughts, plans, no homocidal ideations.  Pt says that he has been fairly stable.    Past Medical History  Diagnosis Date  . BPH (benign prostatic hyperplasia)   . Anxiety   . Depression   . ADHD (attention deficit hyperactivity disorder)   . Abdominal pain, recurrent     chronic lower abdominal pain  . Narcotic abuse     opiates  . Chronic pain   . Hypertension     Past Surgical History  Procedure Date  . Appendectomy 08/2005    Family History  Problem Relation Age of Onset  . Hypertension Father   . Prostate cancer Other     History  Substance Use Topics  . Smoking status: Current Every Day Smoker -- 0.5 packs/day    Types: Cigarettes  . Smokeless tobacco: Never Used  . Alcohol Use: Yes     Comment: occasion    Review of Systems  Constitutional: Positive for fatigue.  Musculoskeletal: Positive for back pain and arthralgias.  Psychiatric/Behavioral: Positive for dysphoric mood and agitation.  All other systems reviewed and are negative.    Allergies  Escitalopram oxalate  Home Medications   Current Outpatient Rx  Name  Route  Sig  Dispense  Refill  . CLONIDINE HCL 0.2 MG PO TABS   Oral   Take 1 tablet (0.2 mg total) by mouth 2 (two) times daily.   60 tablet   4   . FLUOXETINE HCL 40 MG PO CAPS   Oral   Take 1 capsule (40 mg  total) by mouth daily.   30 capsule   4   . HYDROCODONE-ACETAMINOPHEN 5-325 MG PO TABS   Oral   Take 1 tablet by mouth every 6 (six) hours as needed for pain. For pain   30 tablet   0   . TRAMADOL HCL 50 MG PO TABS   Oral   Take 1 tablet (50 mg total) by mouth every 6 (six) hours as needed for pain.   15 tablet   0     BP 115/68  Pulse 90  Temp 98 F (36.7 C) (Oral)  Resp 18  SpO2 100%  Physical Exam  Nursing note and vitals reviewed. Constitutional: He is oriented to person, place, and time. He appears well-developed and well-nourished. No distress.  HENT:  Head: Normocephalic and atraumatic.  Eyes: Conjunctivae normal and EOM are normal. Pupils are equal, round, and reactive to light.  Neck: Normal range of motion. Neck supple.  Cardiovascular: Normal rate and normal heart sounds.   Abdominal: Soft. Bowel sounds are normal. He exhibits no distension. There is no tenderness.  Musculoskeletal: Normal range of motion. He exhibits tenderness. He exhibits no edema.       Lumbar back: He  exhibits tenderness, bony tenderness, pain and spasm. He exhibits no swelling, no edema, no deformity, no laceration and normal pulse.       Arms: Neurological: He is alert and oriented to person, place, and time. He has normal reflexes.  Skin: Skin is warm and dry.    ED Course  Procedures (including critical care time)  Labs Reviewed - No data to display No results found.   No diagnosis found.  MDM  IMPRESSION  Chronic pain and opioid dependence  Depression  History of ADHD  Hypertension   RECOMMENDATIONS / PLAN Refill pain medications today 1 tablet to take at night before bed no refills Refilled medications for blood pressure control.  His blood pressures currently very well controlled on clonidine. Increased his Prozac to 60 mg by mouth daily.  With instructions to take 2 tablets in the morning and one tablet in the evening of the 20 mg tablets Contracted for safety    I had a long discussion with him about getting off the opioids he's currently trying to wean off and is working in conjunction with the behavioral health specialist.   I reviewed some of their notes in the chart today   FOLLOW UP 3 months  The patient was given clear instructions to go to ER or return to medical center if symptoms don't improve, worsen or new problems develop.  The patient verbalized understanding.  The patient was told to call to get lab results if they haven't heard anything in the next week.           Cleora Fleet, MD 08/03/12 2009

## 2012-08-03 NOTE — ED Notes (Signed)
Medication refill-pain in knee, HTN depression anxiety

## 2012-09-15 ENCOUNTER — Encounter (HOSPITAL_COMMUNITY): Payer: Self-pay

## 2012-09-15 ENCOUNTER — Emergency Department (INDEPENDENT_AMBULATORY_CARE_PROVIDER_SITE_OTHER)
Admission: EM | Admit: 2012-09-15 | Discharge: 2012-09-15 | Disposition: A | Discharge status: Home / Self Care | Payer: Self-pay | Source: Home / Self Care

## 2012-09-15 MED ORDER — IBUPROFEN 600 MG PO TABS: 600.0000 mg | ORAL_TABLET | Freq: Four times a day (QID) | ORAL | Status: DC | PRN

## 2012-09-15 MED ORDER — CLONIDINE HCL 0.2 MG PO TABS: 0.2000 mg | ORAL_TABLET | Freq: Two times a day (BID) | ORAL | Status: DC

## 2012-09-15 NOTE — ED Notes (Signed)
Patient Demographics  [name], is a 37 y.o. male  ZOX:[id number]  [id number]:[id number]  DOB - [birth year]  Chief Complaint  Patient presents with  . Medication Refill        Subjective:   Darrow Bussing today he did get his medications refilled, he has history of chronic back and left knee pain and follows with [place] [facility], he was recently in the ER for evaluation of right upper quadrant pain which was thought to be secondary to gallstones and he is following with [facility] for that, he currently wants refills of his blood pressure medications, chronic back and left knee pain is unchanged, no acute discomfort noted on exam, denies any fever chills, no headache no chest pain no abdominal pain. No cough no shortness of breath  Objective:    Filed Vitals:   09/15/12 1605  BP: 138/80  Pulse: 91  Temp: 97.9 F (36.6 C)  TempSrc: Oral  SpO2: 100%     Exam  Awake Alert, Oriented X 3, No new F.N deficits, Normal affect Rigby.AT,PERRAL Supple Neck,No JVD, No cervical lymphadenopathy appriciated.  Symmetrical Chest wall movement, Good air movement bilaterally, CTAB RRR,No Gallops,Rubs or new Murmurs, No Parasternal Heave +ve B.Sounds, Abd Soft, Non tender, No organomegaly appriciated, No rebound - guarding or rigidity. No Cyanosis, Clubbing or edema, No new Rash or bruise  \  Left knee exam is unremarkable good range of motion no pain on passive range of motion, back examined no step deformity on exam, good range of motion. Straight leg raise test is negative.    Data Review   CBC No results found for this basename: WBC, HGB, HCT, PLT, MCV, MCH, MCHC, RDW, NEUTRABS, LYMPHSABS, MONOABS, EOSABS, BASOSABS, BANDABS, BANDSABD,  in the last 168 hours  Chemistries   No results found for this basename: NA, K, CL, CO2, GLUCOSE, BUN, CREATININE, GFRCGP, CALCIUM, MG, AST, ALT, ALKPHOS, BILITOT,  in the last 168  hours ------------------------------------------------------------------------------------------------------------------ No results found for this basename: HGBA1C,  in the last 72 hours ------------------------------------------------------------------------------------------------------------------ No results found for this basename: CHOL, HDL, LDLCALC, TRIG, CHOLHDL, LDLDIRECT,  in the last 72 hours ------------------------------------------------------------------------------------------------------------------ No results found for this basename: TSH, T4TOTAL, FREET3, T3FREE, THYROIDAB,  in the last 72 hours ------------------------------------------------------------------------------------------------------------------ No results found for this basename: VITAMINB12, FOLATE, FERRITIN, TIBC, IRON, RETICCTPCT,  in the last 72 hours  Coagulation profile  No results found for this basename: INR, PROTIME,  in the last 168 hours     Prior to Admission medications   Medication Sig Start Date End Date Taking? Authorizing Provider  cloNIDine (CATAPRES) 0.2 MG tablet Take 1 tablet (0.2 mg total) by mouth 2 (two) times daily. 09/15/12   Leroy Sea, MD  FLUoxetine (PROZAC) 20 MG capsule Take 2 caps in the morning and 1 cap in the evening everyday 08/03/12   [place] Cyndie Mull, MD  HYDROcodone-acetaminophen (NORCO/VICODIN) 5-325 MG per tablet Take 1 tablet by mouth every 6 (six) hours as needed for pain. For pain 08/03/12   Clanford Cyndie Mull, MD  ibuprofen (MOTRIN IB) 600 MG tablet Take 1 tablet (600 mg total) by mouth every 6 (six) hours as needed for pain. 09/15/12   Leroy Sea, MD     Assessment & Plan   Hypertension. Clonidine refills have been provided.  Chronic back and left knee pain. Motrin provided. Agent has been told that no further narcotics will be prescribed for chronic pain management per clinic policy. He has been referred to pain clinic. Patient  has history of opioid  dependence.    Follow-up Information   Follow up with Pain clinic. Schedule an appointment as soon as possible for a visit in 1 week.       Leroy Sea M.D on 09/15/2012 at 4:13 PM   Leroy Sea, MD 09/15/12 [id number]

## 2012-09-15 NOTE — ED Notes (Signed)
Patient here for medication refill History of HTN 

## 2012-09-18 ENCOUNTER — Telehealth (HOSPITAL_COMMUNITY): Payer: Self-pay | Admitting: Emergency Medicine

## 2012-09-18 ENCOUNTER — Encounter (HOSPITAL_COMMUNITY): Payer: Self-pay | Admitting: *Deleted

## 2012-09-18 ENCOUNTER — Emergency Department (HOSPITAL_COMMUNITY)
Admission: EM | Admit: 2012-09-18 | Discharge: 2012-09-18 | Disposition: A | Discharge status: Home / Self Care | Payer: Self-pay | Attending: Emergency Medicine | Admitting: Emergency Medicine

## 2012-09-18 DIAGNOSIS — F909 Attention-deficit hyperactivity disorder, unspecified type: Secondary | ICD-10-CM | POA: Insufficient documentation

## 2012-09-18 DIAGNOSIS — F3289 Other specified depressive episodes: Secondary | ICD-10-CM | POA: Insufficient documentation

## 2012-09-18 DIAGNOSIS — K089 Disorder of teeth and supporting structures, unspecified: Secondary | ICD-10-CM | POA: Insufficient documentation

## 2012-09-18 DIAGNOSIS — K029 Dental caries, unspecified: Secondary | ICD-10-CM | POA: Insufficient documentation

## 2012-09-18 DIAGNOSIS — Z8719 Personal history of other diseases of the digestive system: Secondary | ICD-10-CM | POA: Insufficient documentation

## 2012-09-18 DIAGNOSIS — F17200 Nicotine dependence, unspecified, uncomplicated: Secondary | ICD-10-CM | POA: Insufficient documentation

## 2012-09-18 DIAGNOSIS — I10 Essential (primary) hypertension: Secondary | ICD-10-CM | POA: Insufficient documentation

## 2012-09-18 DIAGNOSIS — Z87448 Personal history of other diseases of urinary system: Secondary | ICD-10-CM | POA: Insufficient documentation

## 2012-09-18 DIAGNOSIS — Z79899 Other long term (current) drug therapy: Secondary | ICD-10-CM | POA: Insufficient documentation

## 2012-09-18 DIAGNOSIS — G8929 Other chronic pain: Secondary | ICD-10-CM | POA: Insufficient documentation

## 2012-09-18 DIAGNOSIS — F329 Major depressive disorder, single episode, unspecified: Secondary | ICD-10-CM | POA: Insufficient documentation

## 2012-09-18 DIAGNOSIS — K0889 Other specified disorders of teeth and supporting structures: Secondary | ICD-10-CM

## 2012-09-18 DIAGNOSIS — F411 Generalized anxiety disorder: Secondary | ICD-10-CM | POA: Insufficient documentation

## 2012-09-18 MED ORDER — BUPIVACAINE HCL (PF) 0.5 % IJ SOLN: 10.0000 mL | Freq: Once | INTRAMUSCULAR | Status: DC

## 2012-09-18 MED ORDER — HYDROCODONE-ACETAMINOPHEN 5-325 MG PO TABS: 2.0000 | ORAL_TABLET | Freq: Four times a day (QID) | ORAL | Status: DC | PRN

## 2012-09-18 MED ORDER — BUPIVACAINE-EPINEPHRINE PF 0.5-1:200000 % IJ SOLN
1.8000 mL | Freq: Once | INTRAMUSCULAR | Status: AC
  Administered 2012-09-18: 9 mg
  Filled 2012-09-18: qty 1.8

## 2012-09-18 MED ORDER — PENICILLIN V POTASSIUM 500 MG PO TABS: 500.0000 mg | ORAL_TABLET | Freq: Four times a day (QID) | ORAL | Status: DC

## 2012-09-18 MED ORDER — OXYCODONE-ACETAMINOPHEN 5-325 MG PO TABS
2.0000 | ORAL_TABLET | Freq: Once | ORAL | Status: AC
  Administered 2012-09-18: 2 via ORAL
  Filled 2012-09-18: qty 2

## 2012-09-18 NOTE — ED Notes (Signed)
Pt has had receding gums for the last 6-7 years, worsening over past few weeks.  Pt does have a good deal of recession around lower left canines.  Pt states he is unable to drink or eat comfortably.  Pt is using Sensodyne tooth paste with no relief.  Pt is occasional smoker.  Pt endorses nausea, but denies vomiting and fever.

## 2012-09-18 NOTE — ED Provider Notes (Signed)
  Medical screening examination/treatment/procedure(s) were performed by non-physician practitioner and as supervising physician I was immediately available for consultation/collaboration.    Gerhard Munch, MD 09/18/12 1547

## 2012-09-18 NOTE — ED Provider Notes (Signed)
History     CSN: [id number]  Arrival date & time 09/18/12  1234   First MD Initiated Contact with Patient 09/18/12 1316      Chief Complaint  Patient presents with  . Dental Pain    (Consider location/radiation/quality/duration/timing/severity/associated sxs/prior treatment) HPI Comments: This is a 37 year old male, who presents emergency department with chief complaint of dental pain. Patient states that he has have receding gums for the past 6-7 years. His complaining of dental pain, which is worsened over the past few weeks. Has been trying to control his pain with OTC pain medicine and sent to face. States that he tried to schedule an appointment with his PCP, but was unable to because they're out of town and will not return until April 1. States his pain is moderate to severe. States it radiates to his ear. He denies any fever, chills, nausea, vomiting.  The history is provided by the patient. No language interpreter was used.    Past Medical History  Diagnosis Date  . BPH (benign prostatic hyperplasia)   . Anxiety   . Depression   . ADHD (attention deficit hyperactivity disorder)   . Abdominal pain, recurrent     chronic lower abdominal pain  . Narcotic abuse     opiates  . Chronic pain   . Hypertension     Past Surgical History  Procedure Laterality Date  . Appendectomy  08/2005    Family History  Problem Relation Age of Onset  . Hypertension Father   . Prostate cancer Other     History  Substance Use Topics  . Smoking status: Current Every Day Smoker -- 0.50 packs/day    Types: Cigarettes  . Smokeless tobacco: Never Used  . Alcohol Use: Yes     Comment: occasion      Review of Systems  All other systems reviewed and are negative.    Allergies  Escitalopram oxalate  Home Medications   Current Outpatient Rx  Name  Route  Sig  Dispense  Refill  . acetaminophen-codeine (TYLENOL #3) 300-30 MG per tablet   Oral   Take 1 tablet by mouth every 4  (four) hours as needed for pain.         . cloNIDine (CATAPRES) 0.2 MG tablet   Oral   Take 1 tablet (0.2 mg total) by mouth 2 (two) times daily.   60 tablet   4   . ibuprofen (ADVIL,MOTRIN) 200 MG tablet   Oral   Take 600 mg by mouth every 6 (six) hours as needed for pain.         . Melatonin 10 MG CAPS   Oral   Take 10 mg by mouth at bedtime as needed (sleep).         Marland Kitchen HYDROcodone-acetaminophen (NORCO/VICODIN) 5-325 MG per tablet   Oral   Take 2 tablets by mouth every 6 (six) hours as needed for pain.   15 tablet   0   . penicillin v potassium (VEETID) 500 MG tablet   Oral   Take 1 tablet (500 mg total) by mouth 4 (four) times daily.   40 tablet   0     BP 128/94  Pulse 97  Temp(Src) 98.1 F (36.7 C) (Oral)  Resp 16  SpO2 99%  Physical Exam  Nursing note and vitals reviewed. Constitutional: He is oriented to person, place, and time. He appears well-developed and well-nourished.  HENT:  Head: Normocephalic and atraumatic.  Poor dentition throughout, receding gum lines  throughout, dental caries throughout, no signs of abscesses, gingival abscesses or peritonsillar or tonsillar abscesses, uvula is midline, airway is intact  Eyes: Conjunctivae and EOM are normal.  Neck: Normal range of motion.  Cardiovascular: Normal rate.   Pulmonary/Chest: Effort normal.  Abdominal: He exhibits no distension.  Musculoskeletal: Normal range of motion.  Neurological: He is alert and oriented to person, place, and time.  Skin: Skin is dry.  Psychiatric: He has a normal mood and affect. His behavior is normal. Judgment and thought content normal.    ED Course  Dental Date/Time: 09/18/2012 3:13 PM Performed by: Roxy Horseman Authorized by: Roxy Horseman Consent: Verbal consent obtained. Risks and benefits: risks, benefits and alternatives were discussed Consent given by: patient Patient understanding: patient states understanding of the procedure being  performed Patient consent: the patient's understanding of the procedure matches consent given Patient identity confirmed: verbally with patient Local anesthesia used: yes Anesthesia: local infiltration Local anesthetic: bupivacaine 0.5% with epinephrine Patient tolerance: Patient tolerated the procedure well with no immediate complications.   (including critical care time)  Labs Reviewed - No data to display No results found.   1. Pain, dental       MDM  37 year old male with dental pain.  Uncomplicated.  Discharge with pain meds, abx, and dental referral.       Roxy Horseman, PA-C 09/18/12 1515

## 2012-09-18 NOTE — ED Notes (Signed)
Pharmacy calling to verify Rx

## 2012-09-30 ENCOUNTER — Encounter (HOSPITAL_COMMUNITY): Payer: Self-pay | Admitting: Cardiology

## 2012-09-30 ENCOUNTER — Emergency Department (HOSPITAL_COMMUNITY)
Admission: EM | Admit: 2012-09-30 | Discharge: 2012-09-30 | Disposition: A | Discharge status: Home / Self Care | Payer: Self-pay | Attending: Emergency Medicine | Admitting: Emergency Medicine

## 2012-09-30 DIAGNOSIS — Z8719 Personal history of other diseases of the digestive system: Secondary | ICD-10-CM | POA: Insufficient documentation

## 2012-09-30 DIAGNOSIS — G8929 Other chronic pain: Secondary | ICD-10-CM | POA: Insufficient documentation

## 2012-09-30 DIAGNOSIS — F17200 Nicotine dependence, unspecified, uncomplicated: Secondary | ICD-10-CM | POA: Insufficient documentation

## 2012-09-30 DIAGNOSIS — Z79899 Other long term (current) drug therapy: Secondary | ICD-10-CM | POA: Insufficient documentation

## 2012-09-30 DIAGNOSIS — Z8659 Personal history of other mental and behavioral disorders: Secondary | ICD-10-CM | POA: Insufficient documentation

## 2012-09-30 DIAGNOSIS — K089 Disorder of teeth and supporting structures, unspecified: Secondary | ICD-10-CM | POA: Insufficient documentation

## 2012-09-30 DIAGNOSIS — Z87448 Personal history of other diseases of urinary system: Secondary | ICD-10-CM | POA: Insufficient documentation

## 2012-09-30 DIAGNOSIS — K0889 Other specified disorders of teeth and supporting structures: Secondary | ICD-10-CM

## 2012-09-30 DIAGNOSIS — I10 Essential (primary) hypertension: Secondary | ICD-10-CM | POA: Insufficient documentation

## 2012-09-30 MED ORDER — HYDROCODONE-ACETAMINOPHEN 5-325 MG PO TABS: 1.0000 | ORAL_TABLET | ORAL | Status: DC | PRN

## 2012-09-30 MED ORDER — CHLORHEXIDINE GLUCONATE 0.12 % MT SOLN: 15.0000 mL | Freq: Two times a day (BID) | OROMUCOSAL | Status: DC

## 2012-09-30 MED ORDER — ONDANSETRON 4 MG PO TBDP
4.0000 mg | ORAL_TABLET | Freq: Once | ORAL | Status: AC
  Administered 2012-09-30: 4 mg via ORAL
  Filled 2012-09-30: qty 1

## 2012-09-30 MED ORDER — PENICILLIN V POTASSIUM 500 MG PO TABS: 500.0000 mg | ORAL_TABLET | Freq: Four times a day (QID) | ORAL | Status: AC

## 2012-09-30 MED ORDER — OXYCODONE-ACETAMINOPHEN 5-325 MG PO TABS
2.0000 | ORAL_TABLET | Freq: Once | ORAL | Status: AC
  Administered 2012-09-30: 2 via ORAL
  Filled 2012-09-30: qty 2

## 2012-09-30 NOTE — ED Provider Notes (Signed)
History     CSN: [id number]  Arrival date & time 09/30/12  0820   First MD Initiated Contact with Patient 09/30/12 0830      Chief Complaint  Patient presents with  . Dental Pain    (Consider location/radiation/quality/duration/timing/severity/associated sxs/prior treatment) HPI  37 year old male with a past medical history significant for psychiatric disease, opiate dependence and abuse presents to the emergency department with the chief complaint of dental pain.  Patient states that this is a chronic and recurrent problem.  He was seen here on 09/18/2012 40 the same complaint.  Patient states that he is unable to afford dental followup.  I have explained it is crucial for him to do so and that we cannot manage chronic pain issues here.  Patient states that he has receding, on the left side.  It is tooth pain is excruciating at this moment.  It is worsened with hot and cold temperatures.  He has radiation into the left jaw and ear.  He denies any swelling under the tongue swelling in the throat difficulty or pain with breathing or swallowing. Denies fevers, chills, myalgias, arthralgias. Denies DOE, SOB, chest tightness or pressure, radiation to left arm, jaw or back, or diaphoresis. Denies dysuria, flank pain, suprapubic pain, frequency, urgency, or hematuria. Denies headaches, light headedness, weakness, visual disturbances. Denies abdominal pain, nausea, vomiting, diarrhea or constipation.   Past Medical History  Diagnosis Date  . BPH (benign prostatic hyperplasia)   . Anxiety   . Depression   . ADHD (attention deficit hyperactivity disorder)   . Abdominal pain, recurrent     chronic lower abdominal pain  . Narcotic abuse     opiates  . Chronic pain   . Hypertension     Past Surgical History  Procedure Laterality Date  . Appendectomy  08/2005    Family History  Problem Relation Age of Onset  . Hypertension Father   . Prostate cancer Other     History  Substance Use  Topics  . Smoking status: Current Every Day Smoker -- 0.50 packs/day    Types: Cigarettes  . Smokeless tobacco: Never Used  . Alcohol Use: Yes     Comment: occasion      Review of Systems Ten systems reviewed and are negative for acute change, except as noted in the HPI.    Allergies  Escitalopram oxalate  Home Medications   Current Outpatient Rx  Name  Route  Sig  Dispense  Refill  . acetaminophen-codeine (TYLENOL #3) 300-30 MG per tablet   Oral   Take 1 tablet by mouth every 4 (four) hours as needed for pain.         . cloNIDine (CATAPRES) 0.2 MG tablet   Oral   Take 1 tablet (0.2 mg total) by mouth 2 (two) times daily.   60 tablet   4   . HYDROcodone-acetaminophen (NORCO/VICODIN) 5-325 MG per tablet   Oral   Take 2 tablets by mouth every 6 (six) hours as needed for pain.   15 tablet   0   . ibuprofen (ADVIL,MOTRIN) 200 MG tablet   Oral   Take 600 mg by mouth every 6 (six) hours as needed for pain.         . Melatonin 10 MG CAPS   Oral   Take 10 mg by mouth at bedtime as needed (sleep).         . penicillin v potassium (VEETID) 500 MG tablet   Oral   Take 1  tablet (500 mg total) by mouth 4 (four) times daily.   40 tablet   0     BP 121/66  Pulse 72  Temp(Src) 97.5 F (36.4 C) (Oral)  Resp 16  SpO2 99%  Physical Exam Physical Exam  Nursing note and vitals reviewed. Constitutional: He appears well-developed and well-nourished. No distress.  HENT:  Head: Normocephalic and atraumatic.  Eyes: Conjunctivae normal are normal. No scleral icterus.  Neck: Normal range of motion. Neck supple.  Mouth: Receding gumline on the left lower jaw.  There is some erythema of the gingival erythema.  He is t not tender to palpation  No purulent discharge.  No areas of fluctuance or signs of developing abscess. Cardiovascular: Normal rate, regular rhythm and normal heart sounds.   Pulmonary/Chest: Effort normal and breath sounds normal. No respiratory distress.   Abdominal: Soft. There is no tenderness.  Musculoskeletal: He exhibits no edema.  Neurological: He is alert.  Skin: Skin is warm and dry. He is not diaphoretic.  Psychiatric: His behavior is normal.    ED Course  Procedures (including critical care time)  Labs Reviewed - No data to display No results found.   1. Pain, dental       MDM  9:06 AM Filed Vitals:   09/30/12 0829  BP: 121/66  Pulse: 72  Temp: 97.5 F (36.4 C)  Resp: 16   Patient with toothache.  No gross abscess.  Exam unconcerning for Ludwig's angina or spread of infection.  Will treat with penicillin and pain medicine.  Urged patient to follow-up with dentist.           Arthor Captain, PA-C 09/30/12 [id number]

## 2012-09-30 NOTE — ED Notes (Signed)
Pt reports dental pain over the past couple of days. Denies any fevers at home. No OTC medication use at home. Tooth looks intact.

## 2012-09-30 NOTE — ED Provider Notes (Signed)
Medical screening examination/treatment/procedure(s) were performed by non-physician practitioner and as supervising physician I was immediately available for consultation/collaboration.  Gyasi Hazzard R. Mussa Groesbeck, MD 09/30/12 1539 

## 2012-10-04 ENCOUNTER — Telehealth (HOSPITAL_COMMUNITY): Payer: Self-pay | Admitting: Emergency Medicine

## 2012-10-25 ENCOUNTER — Emergency Department (HOSPITAL_COMMUNITY)
Admission: EM | Admit: 2012-10-25 | Discharge: 2012-10-25 | Disposition: A | Discharge status: Home / Self Care | Payer: Self-pay | Attending: Emergency Medicine | Admitting: Emergency Medicine

## 2012-10-25 ENCOUNTER — Encounter (HOSPITAL_COMMUNITY): Payer: Self-pay | Admitting: Emergency Medicine

## 2012-10-25 DIAGNOSIS — K006 Disturbances in tooth eruption: Secondary | ICD-10-CM | POA: Insufficient documentation

## 2012-10-25 DIAGNOSIS — G8929 Other chronic pain: Secondary | ICD-10-CM | POA: Insufficient documentation

## 2012-10-25 DIAGNOSIS — K0889 Other specified disorders of teeth and supporting structures: Secondary | ICD-10-CM

## 2012-10-25 DIAGNOSIS — I10 Essential (primary) hypertension: Secondary | ICD-10-CM | POA: Insufficient documentation

## 2012-10-25 DIAGNOSIS — Z8659 Personal history of other mental and behavioral disorders: Secondary | ICD-10-CM | POA: Insufficient documentation

## 2012-10-25 DIAGNOSIS — Z87448 Personal history of other diseases of urinary system: Secondary | ICD-10-CM | POA: Insufficient documentation

## 2012-10-25 DIAGNOSIS — K089 Disorder of teeth and supporting structures, unspecified: Secondary | ICD-10-CM | POA: Insufficient documentation

## 2012-10-25 DIAGNOSIS — Z792 Long term (current) use of antibiotics: Secondary | ICD-10-CM | POA: Insufficient documentation

## 2012-10-25 DIAGNOSIS — Z8719 Personal history of other diseases of the digestive system: Secondary | ICD-10-CM | POA: Insufficient documentation

## 2012-10-25 DIAGNOSIS — F17200 Nicotine dependence, unspecified, uncomplicated: Secondary | ICD-10-CM | POA: Insufficient documentation

## 2012-10-25 MED ORDER — OXYCODONE-ACETAMINOPHEN 5-325 MG PO TABS
2.0000 | ORAL_TABLET | Freq: Once | ORAL | Status: AC
  Administered 2012-10-25: 2 via ORAL
  Filled 2012-10-25: qty 2

## 2012-10-25 MED ORDER — HYDROCODONE-ACETAMINOPHEN 5-325 MG PO TABS: 1.0000 | ORAL_TABLET | Freq: Four times a day (QID) | ORAL | Status: DC | PRN

## 2012-10-25 NOTE — ED Provider Notes (Signed)
History     CSN: [id number]  Arrival date & time 10/25/12  1008   First MD Initiated Contact with Patient 10/25/12 1113      Chief Complaint  Patient presents with  . Dental Pain    (Consider location/radiation/quality/duration/timing/severity/associated sxs/prior treatment) HPI Comments: Patient is a 37 year old male with a history of chronic pain who presents for dental pain x2 days. Patient states that he had grafts done by Dr. Burgess Estelle last week and developed pain with pus like discharge 2 days ago. Patient states the pain is throbbing in nature and constant, mildly relieved with warm compresses to his left cheek. Patient denies fevers, ear pain or discharge, difficulty swallowing, neck pain or stiffness, and shortness of breath. Patient called Dr. Huel Coventry office yesterday and was given a prescription for amoxicillin as well as "2 days worth of Norco". Patient presents to the emergency department today because he states "I can't take the pain". Patient admits to f/u with oral surgeon next week.  Patient is a 37 y.o. male presenting with tooth pain. The history is provided by the patient. No language interpreter was used.  Dental PainPrimary symptoms do not include fever, shortness of breath, sore throat or cough.  Additional symptoms do not include: trouble swallowing, drooling and hearing loss.    Past Medical History  Diagnosis Date  . BPH (benign prostatic hyperplasia)   . Anxiety   . Depression   . ADHD (attention deficit hyperactivity disorder)   . Abdominal pain, recurrent     chronic lower abdominal pain  . Narcotic abuse     opiates  . Chronic pain   . Hypertension     Past Surgical History  Procedure Laterality Date  . Appendectomy  08/2005    Family History  Problem Relation Age of Onset  . Hypertension Father   . Prostate cancer Other     History  Substance Use Topics  . Smoking status: Current Every Day Smoker -- 0.50 packs/day    Types: Cigarettes  .  Smokeless tobacco: Never Used  . Alcohol Use: Yes     Comment: occasion      Review of Systems  Constitutional: Negative for fever.  HENT: Positive for dental problem. Negative for hearing loss, sore throat, drooling, trouble swallowing, neck pain, neck stiffness, tinnitus and ear discharge.   Respiratory: Negative for cough, choking and shortness of breath.   All other systems reviewed and are negative.    Allergies  Escitalopram oxalate and Contrast media  Home Medications   Current Outpatient Rx  Name  Route  Sig  Dispense  Refill  . amoxicillin (AMOXIL) 500 MG capsule   Oral   Take 500 mg by mouth 3 (three) times daily.         . chlorhexidine (PERIDEX) 0.12 % solution   Mouth/Throat   Use as directed 15 mLs in the mouth or throat 2 (two) times daily.   120 mL   0   . HYDROcodone-acetaminophen (NORCO) 5-325 MG per tablet   Oral   Take 1-2 tablets by mouth every 6 (six) hours as needed for pain.   10 tablet   0     There were no vitals taken for this visit.  Physical Exam  Nursing note and vitals reviewed. Constitutional: He appears well-developed and well-nourished. No distress.  HENT:  Head: Normocephalic and atraumatic. No trismus in the jaw.  Right Ear: External ear normal.  Left Ear: External ear normal.  Nose: Nose normal.  Mouth/Throat:  Uvula is midline, oropharynx is clear and moist and mucous membranes are normal. No oral lesions. Abnormal dentition. No dental abscesses or edematous.  Mild gingival erythema appreciated around dental graft; no pus like pocket or area of fluctuance appreciated. No trismus; uvula midline. There is no posterior pharyngeal erythema or edema. No oral lesions.  Eyes: Conjunctivae and EOM are normal. Pupils are equal, round, and reactive to light. Right eye exhibits no discharge. Left eye exhibits no discharge. No scleral icterus.  Neck: Normal range of motion. Neck supple.  No nuchal rigidity  Cardiovascular: Normal rate,  regular rhythm, normal heart sounds and intact distal pulses.   Pulmonary/Chest: Effort normal and breath sounds normal. No respiratory distress. He has no wheezes. He has no rales.  Abdominal: Soft. He exhibits no distension. There is no tenderness.  Musculoskeletal: Normal range of motion.  Lymphadenopathy:    He has no cervical adenopathy.  Skin: Skin is warm and dry. No rash noted. He is not diaphoretic. No erythema.  Psychiatric: He has a normal mood and affect. His behavior is normal.    ED Course  Procedures (including critical care time)  Labs Reviewed - No data to display No results found.   1. Pain, dental     MDM  Patient is a 37 year old male with a history of chronic pain who presents for dental pain. Patient had dental graft done by Dr. Burgess Estelle and has been experiencing increased throbbing type pain for the last 2 days. Patient contacted Dr. Burgess Estelle regarding pain prescribed the patient amoxicillin and "2 days worth of Norco". On physical exam there is mild gingival erythema appreciated around dental graft without pus like pocket or area of fluctuance. No active drainage or oral lesions; no trismus and uvula midline. Patient is well and nontoxic appearing with stable vital signs. Patient has followup with Dr. Burgess Estelle next week and is stable for discharge with this follow up appointment; he has been advised to continue taking the antibiotics prescribed to him. Will give prescription for 10 tabs of Norco for severe pain. Patient has been told to followup with his oral surgeon if he requires further pain management with narcotic pain medicines. Indications for ED return discussed. Patient states for an understanding with this discharge plan with no unaddressed concerns.        Antony Madura, PA-C 10/25/12 1321

## 2012-10-25 NOTE — ED Notes (Signed)
Patient reports dental pain and pus following dental surgery.

## 2012-10-26 NOTE — ED Provider Notes (Signed)
Medical screening examination/treatment/procedure(s) were performed by non-physician practitioner and as supervising physician I was immediately available for consultation/collaboration.    Vida Roller, MD 10/26/12 1322

## 2013-11-12 IMAGING — CR DG ABDOMEN ACUTE W/ 1V CHEST
3 series · 3 of 3 positions shown · non-contrast
Comparison: 09/26/2011

CLINICAL DATA: Abdominal pain

ACUTE ABDOMEN SERIES (ABDOMEN 2 VIEW & CHEST 1 VIEW)

[w chest pa]
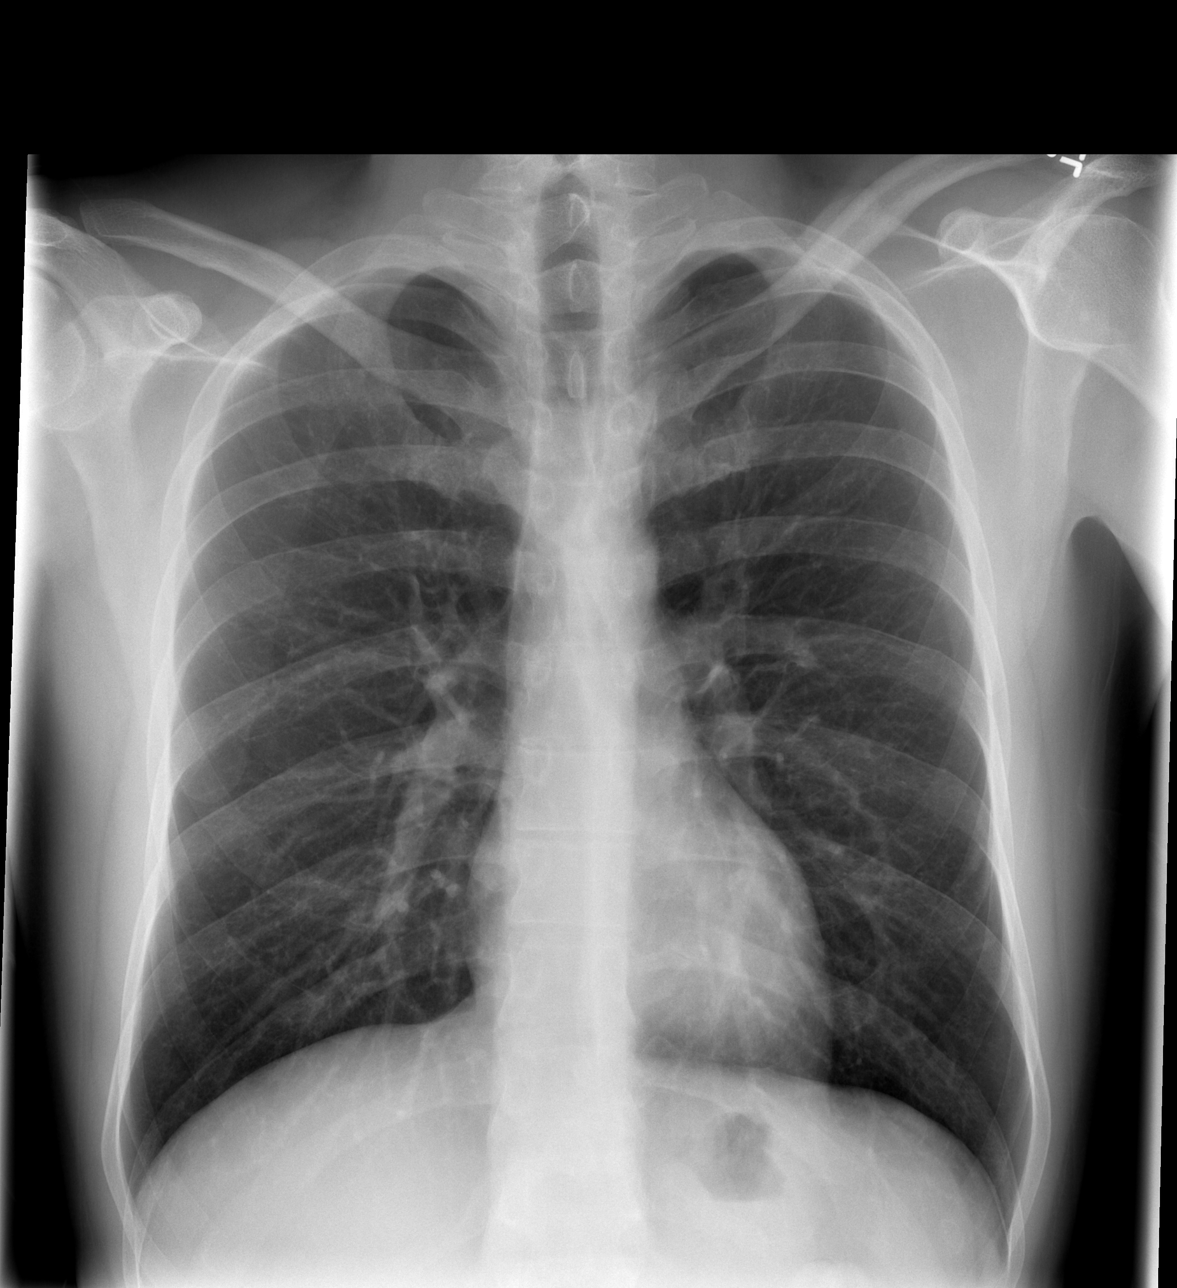

[w abdomen upright]
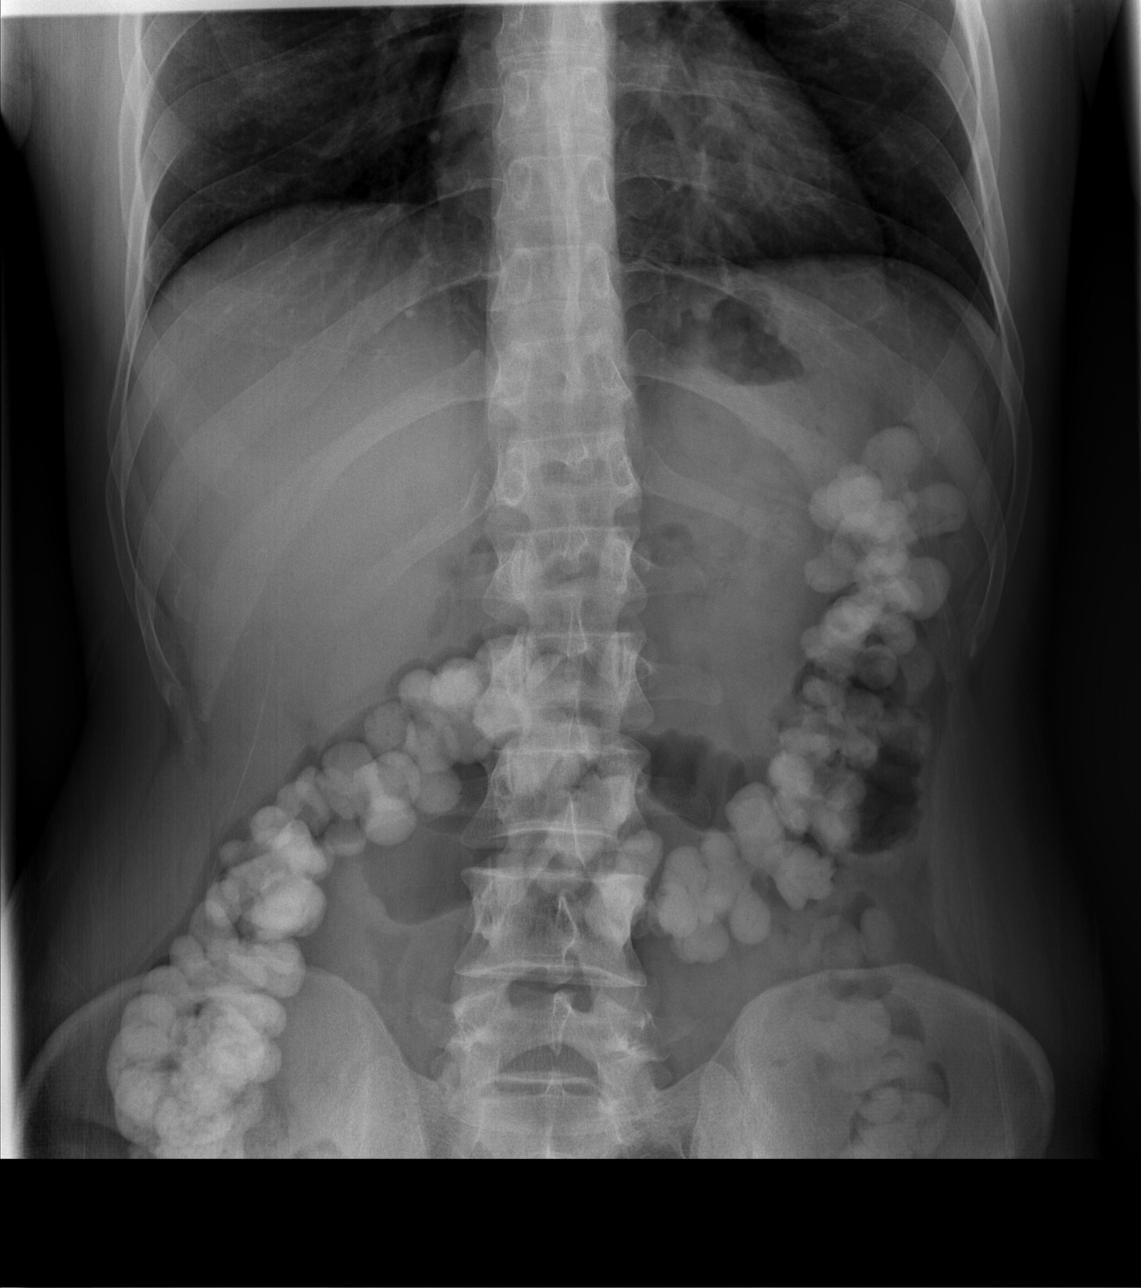

[t abdomen supine]
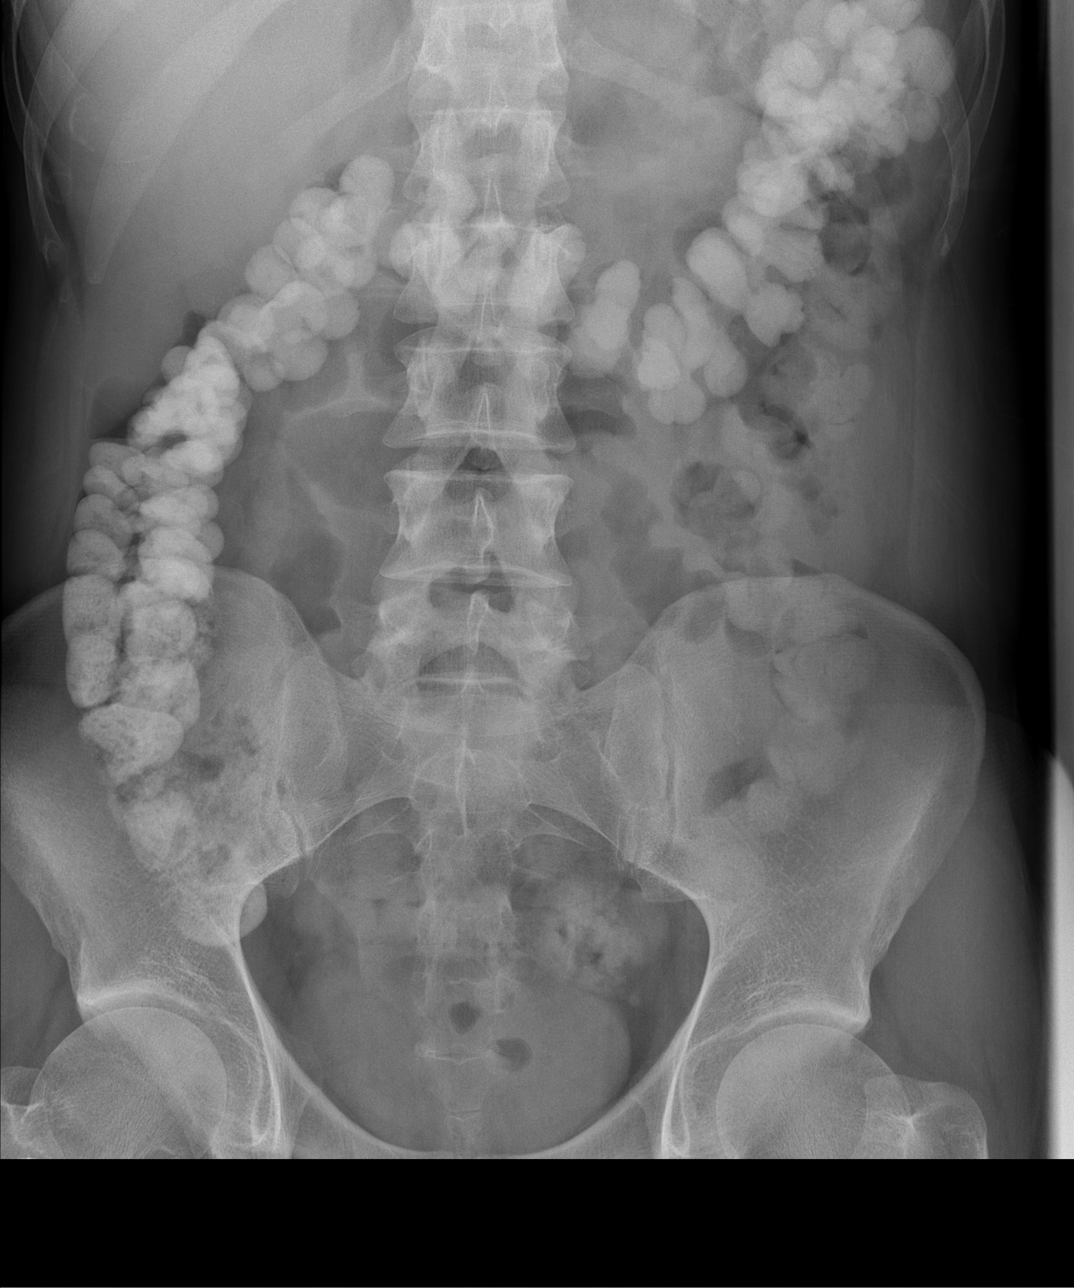

[3 of 3 positions shown; findings below may reference images not displayed]

FINDINGS: Heart size appears normal.

No pleural effusion or edema.

There is no airspace consolidation.

Enteric contrast material from CT dated 09/26/2011 has progressed
into the colon.

There are no dilated loops of small bowel or fluid levels
identified.
IMPRESSION: 1.  Nonobstructive bowel gas pattern.  There is been interval
progression of enteric contrast material from  into the colon.
2.  No active cardiopulmonary abnormalities.

## 2013-11-16 ENCOUNTER — Encounter (HOSPITAL_COMMUNITY): Payer: Self-pay | Admitting: Emergency Medicine

## 2013-11-16 ENCOUNTER — Emergency Department (HOSPITAL_COMMUNITY)
Admission: EM | Admit: 2013-11-16 | Discharge: 2013-11-16 | Disposition: A | Discharge status: Home / Self Care | Payer: Medicaid Other | Attending: Emergency Medicine | Admitting: Emergency Medicine

## 2013-11-16 DIAGNOSIS — I10 Essential (primary) hypertension: Secondary | ICD-10-CM | POA: Insufficient documentation

## 2013-11-16 DIAGNOSIS — K0510 Chronic gingivitis, plaque induced: Secondary | ICD-10-CM | POA: Insufficient documentation

## 2013-11-16 DIAGNOSIS — Z8659 Personal history of other mental and behavioral disorders: Secondary | ICD-10-CM | POA: Insufficient documentation

## 2013-11-16 DIAGNOSIS — F17200 Nicotine dependence, unspecified, uncomplicated: Secondary | ICD-10-CM | POA: Insufficient documentation

## 2013-11-16 DIAGNOSIS — G8929 Other chronic pain: Secondary | ICD-10-CM | POA: Insufficient documentation

## 2013-11-16 DIAGNOSIS — Z87448 Personal history of other diseases of urinary system: Secondary | ICD-10-CM | POA: Insufficient documentation

## 2013-11-16 MED ORDER — HYDROCODONE-ACETAMINOPHEN 5-325 MG PO TABS
1.0000 | ORAL_TABLET | Freq: Once | ORAL | Status: AC
  Administered 2013-11-16: 1 via ORAL
  Filled 2013-11-16: qty 1

## 2013-11-16 MED ORDER — NAPROXEN 500 MG PO TABS: 500.0000 mg | ORAL_TABLET | Freq: Two times a day (BID) | ORAL | Status: DC

## 2013-11-16 NOTE — ED Provider Notes (Signed)
CSN: [id number]     Arrival date & time 11/16/13  [id number] History   First MD Initiated Contact with Patient 11/16/13 0934    This chart was scribed for Fayrene HelperBowie Fredonia Casalino PA-C, a non-physician practitioner working with Gerhard Munchobert Lockwood, MD by Lewanda RifeAlexandra Hurtado, ED Scribe. This patient was seen in room TR06C/TR06C and the patient's care was started at 9:52 AM     Chief Complaint  Patient presents with  . Dental Pain     (Consider location/radiation/quality/duration/timing/severity/associated sxs/prior Treatment) The history is provided by the patient. No language interpreter was used.   [name] is a 38 y.o. male who presents to the Emergency Department complaining of constant lower left dental pain onset 1 month. The patient has tried to alleviate pain with naproxen with no relief of symptoms.  Pain described as moderate in severity, characterized as throbbing in nature and located in left lower gumline. Reports pain is exacerbated by eating. Patient denies fever, night sweats, chills, foul taste in mouth, difficulty swallowing or opening mouth, shortness of breath, and weight loss.  Patient does not have a dentist. States he used to "chew tobacco, but quit".     Past Medical History  Diagnosis Date  . BPH (benign prostatic hyperplasia)   . Anxiety   . Depression   . ADHD (attention deficit hyperactivity disorder)   . Abdominal pain, recurrent     chronic lower abdominal pain  . Narcotic abuse     opiates  . Chronic pain   . Hypertension    Past Surgical History  Procedure Laterality Date  . Appendectomy  08/2005   Family History  Problem Relation Age of Onset  . Hypertension Father   . Prostate cancer Other    History  Substance Use Topics  . Smoking status: Current Every Day Smoker -- 0.50 packs/day    Types: Cigarettes  . Smokeless tobacco: Never Used  . Alcohol Use: Yes     Comment: occasion    Review of Systems  Constitutional: Negative for fever, diaphoresis and  unexpected weight change.  HENT: Positive for dental problem.   Psychiatric/Behavioral: Negative for confusion.      Allergies  Escitalopram oxalate and Contrast media  Home Medications   Prior to Admission medications   Medication Sig Start Date End Date Taking? Authorizing Provider  chlorhexidine (PERIDEX) 0.12 % solution Use as directed 15 mLs in the mouth or throat 2 (two) times daily. 09/30/12   Arthor CaptainAbigail Harris, PA-C  HYDROcodone-acetaminophen (NORCO) 5-325 MG per tablet Take 1-2 tablets by mouth every 6 (six) hours as needed for pain. 10/25/12   Antony MaduraKelly Humes, PA-C   There were no vitals taken for this visit. Physical Exam  Nursing note and vitals reviewed. Constitutional: He is oriented to person, place, and time. He appears well-developed and well-nourished. No distress.  HENT:  Head: Normocephalic and atraumatic.  Mild dyplasia of the gingival tissue below tooth 21. TTP with mild surrounding erythema of gum tissue. Dental erosion noted at the base of tooth 21 with receeding gumline. No evidence of abscess.  No signs of peritonsillar or tonsillar abscess. No signs of gingival abscess. Oropharynx is clear and without exudates.  Uvula is midline.  Airway is intact. No signs of Ludwig's angina with palpation of the oral and sublingual mucosa.    Eyes: EOM are normal.  Neck: Neck supple. No tracheal deviation present.  Cardiovascular: Normal rate.   Pulmonary/Chest: Effort normal. No respiratory distress.  Musculoskeletal: Normal range of motion.  Lymphadenopathy:  He has no cervical adenopathy.  Neurological: He is alert and oriented to person, place, and time.  Skin: Skin is warm and dry.  Psychiatric: He has a normal mood and affect. His behavior is normal.    ED Course  Procedures (including critical care time) COORDINATION OF CARE:  Nursing notes reviewed. Vital signs reviewed. Initial pt interview and examination performed.   Filed Vitals:   11/16/13 0939  BP:  98/63  Pulse: 90  Temp: 97.9 F (36.6 C)  TempSrc: Oral  Resp: 18  Height: 5\' 8"  (1.727 m)  Weight: 157 lb (71.215 kg)  SpO2: 99%    9:59 AM-Discussed work up plan with pt at bedside, which includes having pt f/u with oral surgeon given the abnormal gingival growth concerning for cancer.  Less likely to be an abscess given its presence for more than 1 month.  Pt also have been seen in ER 4 times for dental pain within 2 months and since he has hx of narcotic abuse i do not think it's appropriate to prescribe narcotic pain meds. No abx prescribed as this is less likely an infection and pt has received abx in the past for similar complaint without resolution of sxs. He needs to f/u with specialist. Pt agrees with plan.   Treatment plan initiated: Medications  HYDROcodone-acetaminophen (NORCO/VICODIN) 5-325 MG per tablet 1 tablet (not administered)     Initial diagnostic testing ordered.        Labs Review Labs Reviewed - No data to display  Imaging Review No results found.   EKG Interpretation None      MDM   Final diagnoses:  Pain from gingivitis    BP 98/63  Pulse 90  Temp(Src) 97.9 F (36.6 C) (Oral)  Resp 18  Ht 5\' 8"  (1.727 m)  Wt 157 lb (71.215 kg)  BMI 23.88 kg/m2  SpO2 99%   I personally performed the services described in this documentation, which was scribed in my presence. The recorded information has been reviewed and is accurate.     Fayrene HelperBowie Reubin Bushnell, PA-C 11/16/13 1029

## 2013-11-16 NOTE — Discharge Instructions (Signed)
Please follow up with an oral surgeon for further evaluation of your gum pain and abnormal gum growth to ensure it's not related to cancer.  Take naprosen with food for pain.    Gum Disease Gum disease is an infection of the tissues that surround and support the teeth (periodontium). This includes the gums, connective tissue fibers (ligaments), and the thickened ridges of the tooth bone (sockets). The disease is caused by germs (bacteria) that grow in soft deposits (plaque) on the teeth. This results in redness, soreness, and swelling (inflammation). This inflammation causes the gums to bleed. If left untreated, it can lead to damage of the tissues and supportive bone. Although bacteria are known as the major cause of gum disease, other risk factors include tobacco use, diabetes, certain medications, hormones, pregnancy, and genetic factors. SYMPTOMS   Gums that bleed easily.  Red or swollen gums.  Bad breath that does not go away.  Gums that have pulled away from the teeth.  Loose or separating permanent teeth.  Painful chewing.  Changes in the way your teeth fit together. DIAGNOSIS  A thorough exam will be performed by a dentist to determine the presence and stage of gum disease. The stage is how far the gum disease has developed. TREATMENT  Treatment is based on the stages of gum disease. The stages include:  Mild. If it is caught early, conditions can improve by brushing and flossing properly.  Moderate. You may need special cleaning (scaling and root planing). This method removes plaque and hardened plaque (tartar) above and below the gum line. Medication may also be used to treat moderate gum disease.  Severe. This stage requires surgery of the gums and supporting bone. PREVENTION  You can prevent gum disease by:  Practicing good oral hygiene, including brushing and flossing properly.  Avoiding use of tobacco products.  Scheduling regular dental check-ups and  cleanings.  Eating a well-balanced diet. SEEK IMMEDIATE DENTAL OR MEDICAL CARE IF:  You have fever over 102 F (38.9 C).  You have swelling of your face, neck, or jaw.  You are unable to open your mouth.  You have severe pain not controlled by pain medicine. Document Released: 12/04/2009 Document Revised: 03/10/2012 Document Reviewed: 12/04/2009 [facility] Patient Information 2014 [employer], [place].

## 2013-11-16 NOTE — ED Notes (Signed)
Patient states he started having dental pain in the lower left front teeth.  Patient states unsure if broken teeth.  Patient states he "used to dip, but now I dont".  Patient said no insurance and hasn't been to dentist in several years.

## 2013-11-16 NOTE — ED Provider Notes (Signed)
  Medical screening examination/treatment/procedure(s) were performed by non-physician practitioner and as supervising physician I was immediately available for consultation/collaboration.   EKG Interpretation None          Eliot Popper, MD 11/16/13 1644 

## 2013-12-06 IMAGING — CR DG ABDOMEN ACUTE W/ 1V CHEST
3 series · 3 of 3 positions shown · non-contrast
Comparison: 09/27/2011

CLINICAL DATA: Rectal bleeding

ACUTE ABDOMEN SERIES (ABDOMEN 2 VIEW & CHEST 1 VIEW)

[w chest pa]
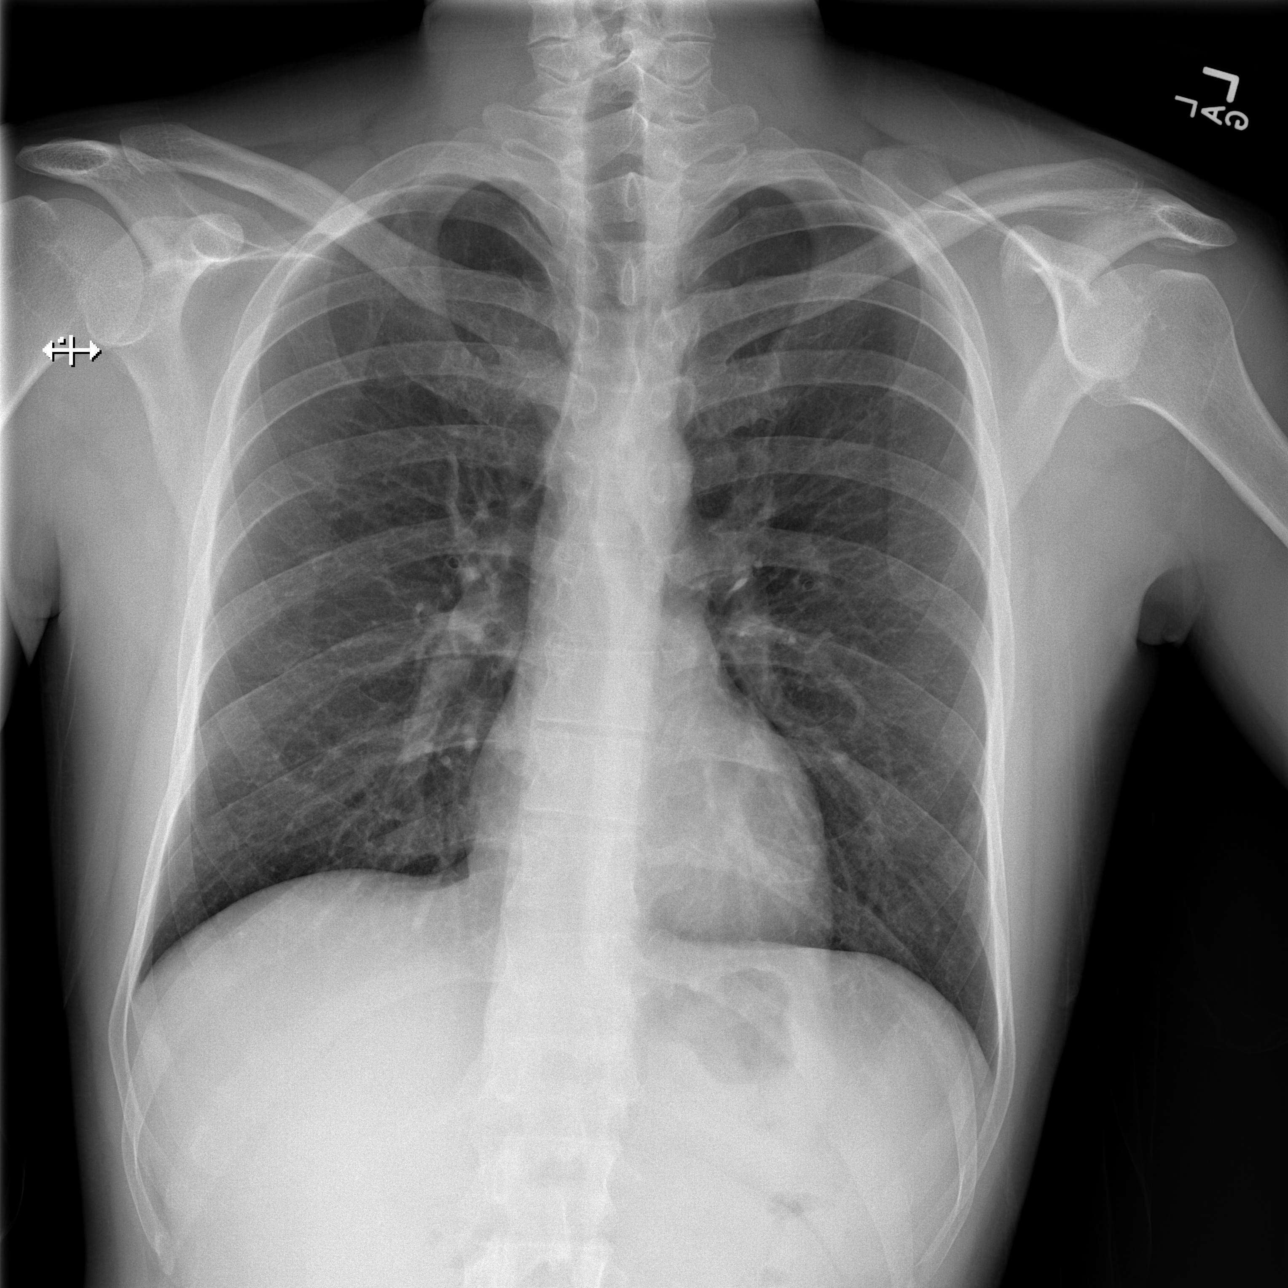

[w abdomen upright]
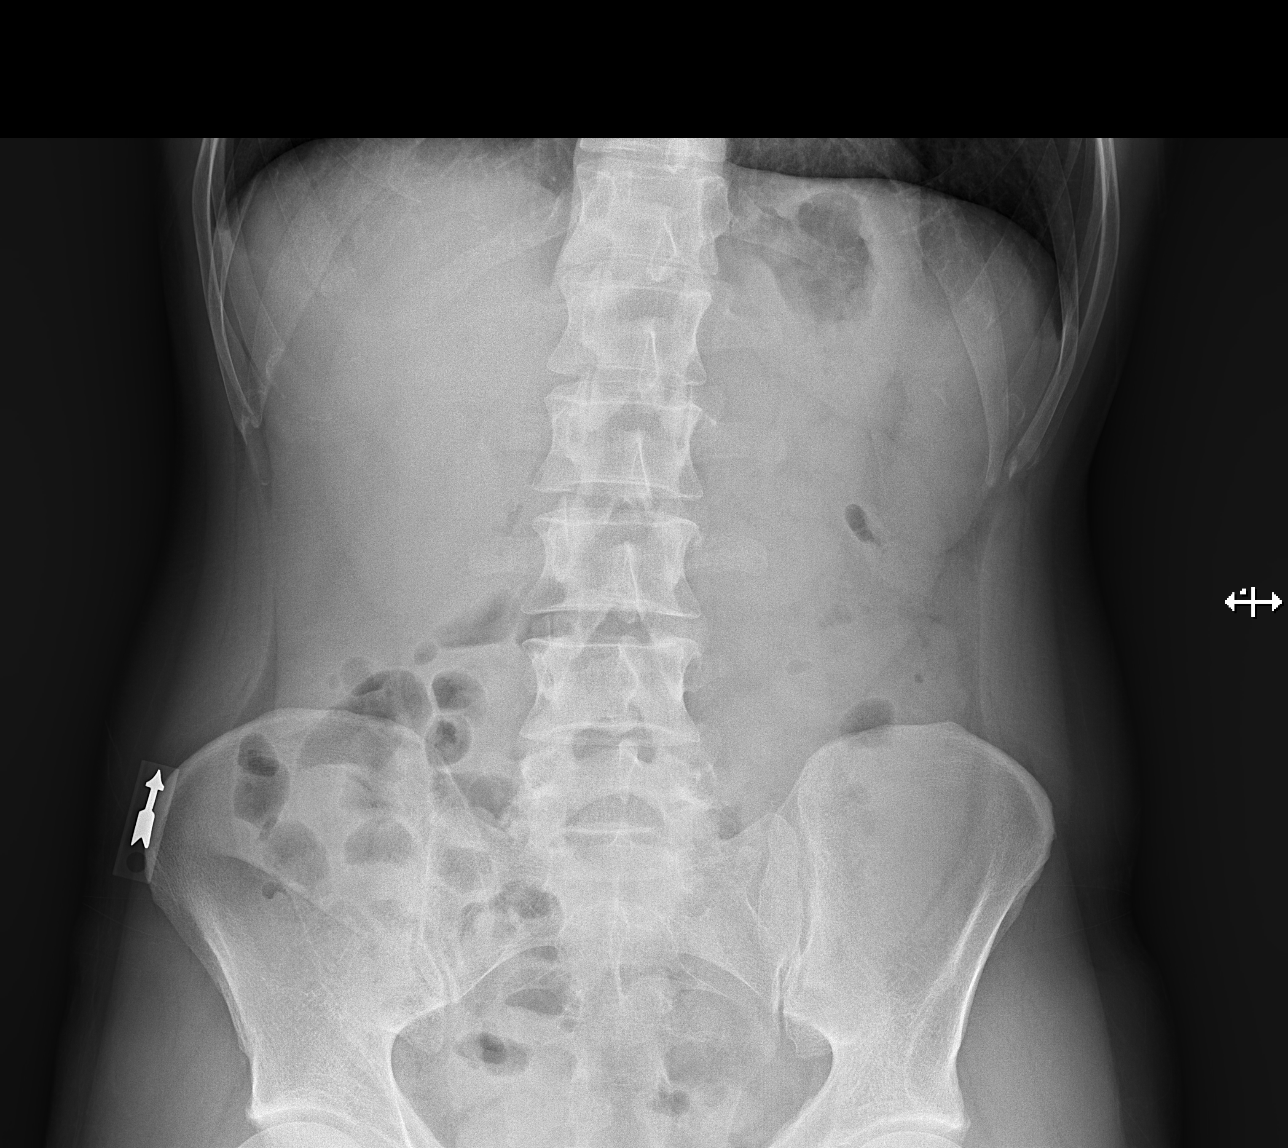

[t abdomen supine]
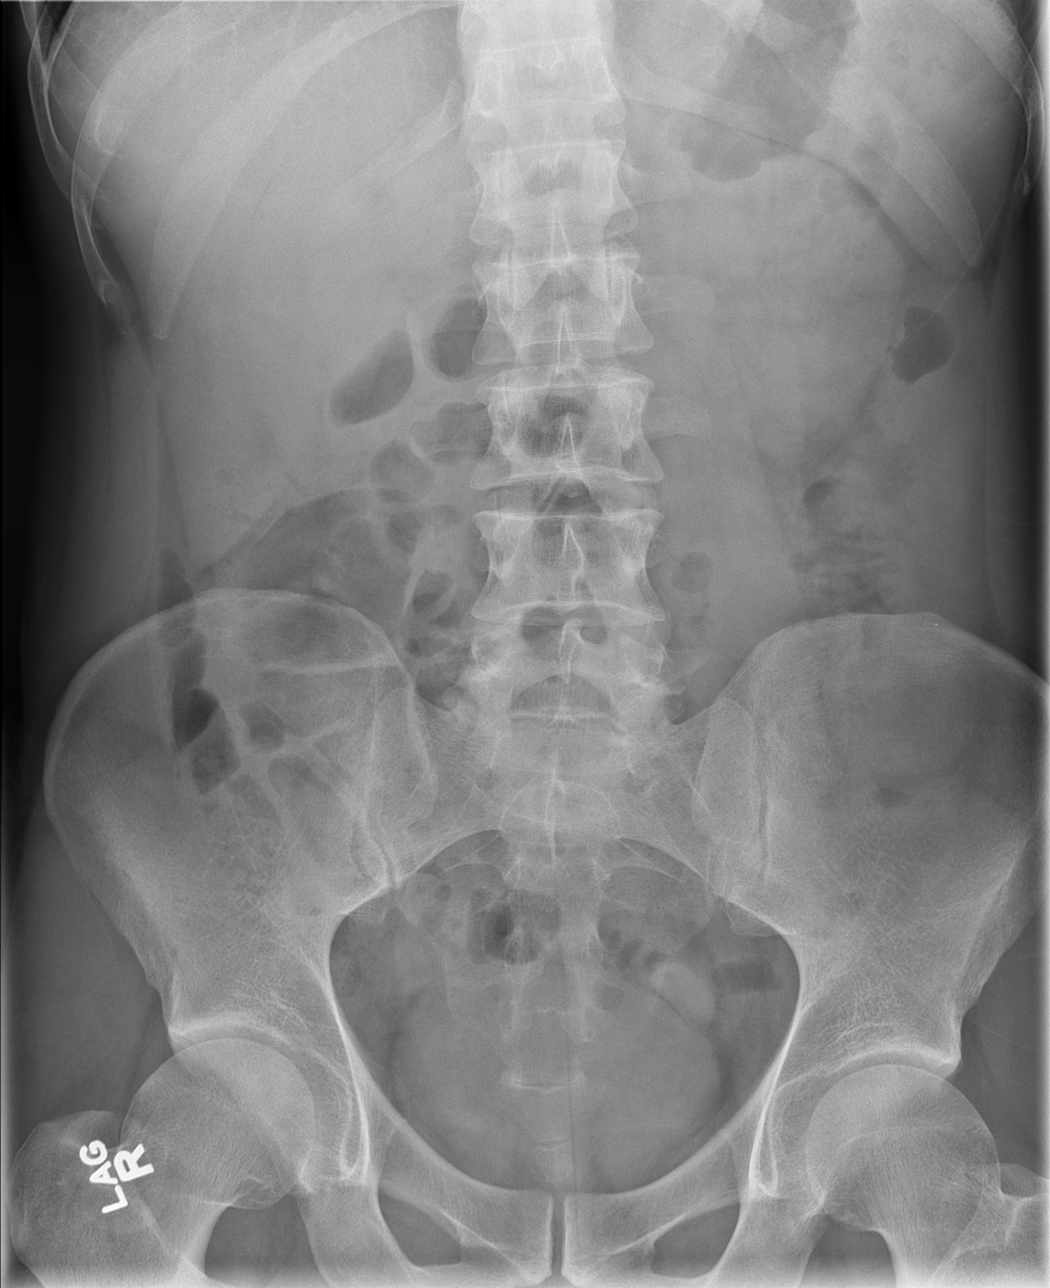

[3 of 3 positions shown; findings below may reference images not displayed]

FINDINGS: Normal heart size and pulmonary vascularity.  No focal
consolidation in the lungs.  No blunting of costophrenic angles.

Scattered gas and stool in the colon.  No small or large bowel
dilatation.  No free intra-abdominal air.  No abnormal air fluid
levels.  No radiopaque stones.  Interval evacuation of contrast
material from the colon.  No other significant change.
IMPRESSION: No evidence of active pulmonary disease.  Nonobstructive bowel gas
pattern.

## 2014-02-28 DIAGNOSIS — R1012 Left upper quadrant pain: Secondary | ICD-10-CM | POA: Insufficient documentation

## 2014-02-28 DIAGNOSIS — F3289 Other specified depressive episodes: Secondary | ICD-10-CM | POA: Diagnosis not present

## 2014-02-28 DIAGNOSIS — Z87448 Personal history of other diseases of urinary system: Secondary | ICD-10-CM | POA: Diagnosis not present

## 2014-02-28 DIAGNOSIS — R112 Nausea with vomiting, unspecified: Secondary | ICD-10-CM | POA: Insufficient documentation

## 2014-02-28 DIAGNOSIS — Z79899 Other long term (current) drug therapy: Secondary | ICD-10-CM | POA: Diagnosis not present

## 2014-02-28 DIAGNOSIS — R197 Diarrhea, unspecified: Secondary | ICD-10-CM | POA: Insufficient documentation

## 2014-02-28 DIAGNOSIS — R109 Unspecified abdominal pain: Secondary | ICD-10-CM | POA: Insufficient documentation

## 2014-02-28 DIAGNOSIS — I10 Essential (primary) hypertension: Secondary | ICD-10-CM | POA: Diagnosis not present

## 2014-02-28 DIAGNOSIS — Z9089 Acquired absence of other organs: Secondary | ICD-10-CM | POA: Diagnosis not present

## 2014-02-28 DIAGNOSIS — F411 Generalized anxiety disorder: Secondary | ICD-10-CM | POA: Diagnosis not present

## 2014-02-28 DIAGNOSIS — F329 Major depressive disorder, single episode, unspecified: Secondary | ICD-10-CM | POA: Diagnosis not present

## 2014-02-28 DIAGNOSIS — G8929 Other chronic pain: Secondary | ICD-10-CM | POA: Insufficient documentation

## 2014-02-28 DIAGNOSIS — F17200 Nicotine dependence, unspecified, uncomplicated: Secondary | ICD-10-CM | POA: Insufficient documentation

## 2014-03-01 ENCOUNTER — Encounter (HOSPITAL_COMMUNITY): Payer: Self-pay | Admitting: Emergency Medicine

## 2014-03-01 ENCOUNTER — Emergency Department (HOSPITAL_COMMUNITY): Payer: Medicaid Other

## 2014-03-01 ENCOUNTER — Emergency Department (HOSPITAL_COMMUNITY)
Admission: EM | Admit: 2014-03-01 | Discharge: 2014-03-01 | Disposition: A | Discharge status: Home / Self Care | Payer: Medicaid Other | Attending: Emergency Medicine | Admitting: Emergency Medicine

## 2014-03-01 DIAGNOSIS — R1012 Left upper quadrant pain: Secondary | ICD-10-CM

## 2014-03-01 LAB — COMPREHENSIVE METABOLIC PANEL
ALT: 19 U/L (ref 0–53)
ANION GAP: 11 (ref 5–15)
AST: 21 U/L (ref 0–37)
Albumin: 3.4 g/dL — ABNORMAL LOW (ref 3.5–5.2)
Alkaline Phosphatase: 46 U/L (ref 39–117)
BUN: 15 mg/dL (ref 6–23)
CALCIUM: 8.5 mg/dL (ref 8.4–10.5)
CO2: 26 meq/L (ref 19–32)
CREATININE: 0.93 mg/dL (ref 0.50–1.35)
Chloride: 101 mEq/L (ref 96–112)
GFR calc Af Amer: >90 mL/min (ref >90)
Glucose, Bld: 97 mg/dL (ref 70–99)
Potassium: 4.1 mEq/L (ref 3.7–5.3)
Sodium: 138 mEq/L (ref 137–147)
Total Bilirubin: 0.3 mg/dL (ref 0.3–1.2)
Total Protein: 5.7 g/dL — ABNORMAL LOW (ref 6.0–8.3)

## 2014-03-01 LAB — URINALYSIS, ROUTINE W REFLEX MICROSCOPIC
BILIRUBIN URINE: NEGATIVE
Glucose, UA: NEGATIVE mg/dL
Hgb urine dipstick: NEGATIVE
KETONES UR: NEGATIVE mg/dL
Leukocytes, UA: NEGATIVE
NITRITE: NEGATIVE
PROTEIN: NEGATIVE mg/dL
SPECIFIC GRAVITY, URINE: 1.009 (ref 1.005–1.030)
UROBILINOGEN UA: 0.2 mg/dL (ref 0.0–1.0)
pH: 6 (ref 5.0–8.0)

## 2014-03-01 LAB — CBC WITH DIFFERENTIAL/PLATELET
BASOS ABS: 0 10*3/uL (ref 0.0–0.1)
BASOS PCT: 0 % (ref 0–1)
EOS PCT: 4 % (ref 0–5)
Eosinophils Absolute: 0.3 10*3/uL (ref 0.0–0.7)
HEMATOCRIT: 42.6 % (ref 39.0–52.0)
Hemoglobin: 14.6 g/dL (ref 13.0–17.0)
LYMPHS PCT: 31 % (ref 12–46)
Lymphs Abs: 2.2 10*3/uL (ref 0.7–4.0)
MCH: 31.1 pg (ref 26.0–34.0)
MCHC: 34.3 g/dL (ref 30.0–36.0)
MCV: 90.8 fL (ref 78.0–100.0)
MONO ABS: 0.4 10*3/uL (ref 0.1–1.0)
Monocytes Relative: 6 % (ref 3–12)
Neutro Abs: 4.1 10*3/uL (ref 1.7–7.7)
Neutrophils Relative %: 59 % (ref 43–77)
Platelets: 232 10*3/uL (ref 150–400)
RBC: 4.69 MIL/uL (ref 4.22–5.81)
RDW: 13.4 % (ref 11.5–15.5)
WBC: 7.1 10*3/uL (ref 4.0–10.5)

## 2014-03-01 LAB — TROPONIN I

## 2014-03-01 LAB — LIPASE, BLOOD: LIPASE: 14 U/L (ref 11–59)

## 2014-03-01 MED ORDER — POLYETHYLENE GLYCOL 3350 17 G PO PACK: 17.0000 g | PACK | Freq: Every day | ORAL | Status: DC

## 2014-03-01 MED ORDER — GI COCKTAIL ~~LOC~~
30.0000 mL | Freq: Once | ORAL | Status: AC
  Administered 2014-03-01: 30 mL via ORAL
  Filled 2014-03-01: qty 30

## 2014-03-01 MED ORDER — DICYCLOMINE HCL 20 MG PO TABS: 20.0000 mg | ORAL_TABLET | Freq: Two times a day (BID) | ORAL | Status: DC

## 2014-03-01 NOTE — ED Provider Notes (Signed)
CSN: [id number]     Arrival date & time 02/28/14  2355 History   First MD Initiated Contact with Patient 03/01/14 0107     Chief Complaint  Patient presents with  . Abdominal Pain     (Consider location/radiation/quality/duration/timing/severity/associated sxs/prior Treatment) HPI Patient presents with one week of left upper quadrant abdominal pain that has been constant. He's had some mild nausea. Over the week he has had several loose stools. No definite blood. No fever or chills. Pain does not seem to be associated with food. No urinary symptoms. Denies any chest pain. Upper, pain is worse with deep inspiration. Past Medical History  Diagnosis Date  . BPH (benign prostatic hyperplasia)   . Anxiety   . Depression   . ADHD (attention deficit hyperactivity disorder)   . Abdominal pain, recurrent     chronic lower abdominal pain  . Narcotic abuse     opiates  . Chronic pain   . Hypertension    Past Surgical History  Procedure Laterality Date  . Appendectomy  08/2005   Family History  Problem Relation Age of Onset  . Hypertension Father   . Prostate cancer Other    History  Substance Use Topics  . Smoking status: Current Some Day Smoker -- 0.50 packs/day    Types: Cigarettes  . Smokeless tobacco: Never Used  . Alcohol Use: No     Comment: occasion    Review of Systems  Constitutional: Negative for fever and chills.  Respiratory: Negative for cough and shortness of breath.   Cardiovascular: Negative for chest pain, palpitations and leg swelling.  Gastrointestinal: Positive for nausea, abdominal pain and diarrhea. Negative for vomiting and constipation.  Genitourinary: Negative for dysuria, frequency, hematuria and flank pain.  Musculoskeletal: Negative for back pain, myalgias, neck pain and neck stiffness.  Skin: Negative for rash.  Neurological: Negative for dizziness, weakness, light-headedness, numbness and headaches.  All other systems reviewed and are  negative.     Allergies  Escitalopram oxalate and Contrast media  Home Medications   Prior to Admission medications   Medication Sig Start Date End Date Taking? Authorizing Provider  Buprenorphine HCl-Naloxone HCl (SUBOXONE) 8-2 MG FILM Place 1 Film under the tongue 2 (two) times daily.   Yes Historical Provider, MD  dicyclomine (BENTYL) 20 MG tablet Take 1 tablet (20 mg total) by mouth 2 (two) times daily. 03/01/14   Loren Racer, MD  FLUoxetine (PROZAC) 40 MG capsule Take 40 mg by mouth daily.   Yes Historical Provider, MD  methylphenidate (RITALIN) 10 MG tablet Take 10 mg by mouth daily.   Yes Historical Provider, MD  polyethylene glycol (MIRALAX / GLYCOLAX) packet Take 17 g by mouth daily. 03/01/14   Loren Racer, MD   BP 102/63  Pulse 68  Temp(Src) 97.8 F (36.6 C) (Oral)  Resp 18  Ht  (1.727 m)  Wt 163 lb (73.936 kg)  BMI 24.79 kg/m2  SpO2 98% Physical Exam  Nursing note and vitals reviewed. Constitutional: He is oriented to person, place, and time. He appears well-developed and well-nourished. No distress.  HENT:  Head: Normocephalic and atraumatic.  Mouth/Throat: Oropharynx is clear and moist.  Eyes: EOM are normal. Pupils are equal, round, and reactive to light.  Neck: Normal range of motion. Neck supple.  Cardiovascular: Normal rate and regular rhythm.   Pulmonary/Chest: Effort normal and breath sounds normal. No respiratory distress. He has no wheezes. He has no rales.  Abdominal: Soft. Bowel sounds are normal. He exhibits no  distension and no mass. There is tenderness (mild left upper quadrant tenderness to palpation.). There is no rebound and no guarding.  Musculoskeletal: Normal range of motion. He exhibits no edema and no tenderness.  No CVA tenderness bilaterally.  Neurological: He is alert and oriented to person, place, and time.  Skin: Skin is warm and dry. No rash noted. No erythema.  Psychiatric: He has a normal mood and affect. His behavior is  normal.    ED Course  Procedures (including critical care time) Labs Review Labs Reviewed  COMPREHENSIVE METABOLIC PANEL - Abnormal; Notable for the following:    Total Protein 5.7 (*)    Albumin 3.4 (*)    All other components within normal limits  CBC WITH DIFFERENTIAL  LIPASE, BLOOD  TROPONIN I  URINALYSIS, ROUTINE W REFLEX MICROSCOPIC    Imaging Review No results found.   EKG Interpretation   Date/Time:  Wednesday March 01 2014 00:05:10 EDT Ventricular Rate:  90 PR Interval:  128 QRS Duration: 90 QT Interval:  372 QTC Calculation: 455 R Axis:   14 Text Interpretation:  Normal sinus rhythm Normal ECG ED PHYSICIAN  INTERPRETATION AVAILABLE IN [facility] Confirmed by TEST, Record  ([id number]) on 03/03/2014 6:50:40 AM      MDM   Final diagnoses:  Left upper quadrant pain   Patient's exam is benign. Do not suspect surgical cause for his symptoms. Likely due to constipation. Will start on Bentyl and MiraLax. Return precautions given.     Loren Racer, MD 03/04/14 [phone]

## 2014-03-01 NOTE — ED Notes (Signed)
Patient transported to X-ray 

## 2014-03-01 NOTE — ED Notes (Signed)
Pt presents with LUQ pain worse with inspiration for the past week.  Denies N/V- admits to diarrhea.  Denies urinary symptoms.

## 2014-03-01 NOTE — Discharge Instructions (Signed)

## 2014-07-08 ENCOUNTER — Emergency Department (HOSPITAL_COMMUNITY)
Admission: EM | Admit: 2014-07-08 | Discharge: 2014-07-09 | Disposition: A | Discharge status: Home / Self Care | Payer: Medicaid Other | Attending: Emergency Medicine | Admitting: Emergency Medicine

## 2014-07-08 ENCOUNTER — Encounter (HOSPITAL_COMMUNITY): Payer: Self-pay | Admitting: Emergency Medicine

## 2014-07-08 DIAGNOSIS — Z87891 Personal history of nicotine dependence: Secondary | ICD-10-CM | POA: Insufficient documentation

## 2014-07-08 DIAGNOSIS — F909 Attention-deficit hyperactivity disorder, unspecified type: Secondary | ICD-10-CM | POA: Diagnosis not present

## 2014-07-08 DIAGNOSIS — I10 Essential (primary) hypertension: Secondary | ICD-10-CM | POA: Insufficient documentation

## 2014-07-08 DIAGNOSIS — F419 Anxiety disorder, unspecified: Secondary | ICD-10-CM | POA: Insufficient documentation

## 2014-07-08 DIAGNOSIS — Z79899 Other long term (current) drug therapy: Secondary | ICD-10-CM | POA: Insufficient documentation

## 2014-07-08 DIAGNOSIS — N400 Enlarged prostate without lower urinary tract symptoms: Secondary | ICD-10-CM | POA: Insufficient documentation

## 2014-07-08 DIAGNOSIS — G8929 Other chronic pain: Secondary | ICD-10-CM | POA: Diagnosis not present

## 2014-07-08 DIAGNOSIS — M549 Dorsalgia, unspecified: Secondary | ICD-10-CM | POA: Diagnosis not present

## 2014-07-08 DIAGNOSIS — R1012 Left upper quadrant pain: Secondary | ICD-10-CM | POA: Insufficient documentation

## 2014-07-08 DIAGNOSIS — F329 Major depressive disorder, single episode, unspecified: Secondary | ICD-10-CM | POA: Diagnosis not present

## 2014-07-08 LAB — CBC WITH DIFFERENTIAL/PLATELET
BASOS ABS: 0 10*3/uL (ref 0.0–0.1)
Basophils Relative: 0 % (ref 0–1)
EOS ABS: 0.2 10*3/uL (ref 0.0–0.7)
EOS PCT: 3 % (ref 0–5)
HCT: 40.6 % (ref 39.0–52.0)
HEMOGLOBIN: 13.7 g/dL (ref 13.0–17.0)
LYMPHS PCT: 32 % (ref 12–46)
Lymphs Abs: 1.8 10*3/uL (ref 0.7–4.0)
MCH: 30.2 pg (ref 26.0–34.0)
MCHC: 33.7 g/dL (ref 30.0–36.0)
MCV: 89.4 fL (ref 78.0–100.0)
Monocytes Absolute: 0.3 10*3/uL (ref 0.1–1.0)
Monocytes Relative: 6 % (ref 3–12)
Neutro Abs: 3.4 10*3/uL (ref 1.7–7.7)
Neutrophils Relative %: 59 % (ref 43–77)
Platelets: 213 10*3/uL (ref 150–400)
RBC: 4.54 MIL/uL (ref 4.22–5.81)
RDW: 13.1 % (ref 11.5–15.5)
WBC: 5.8 10*3/uL (ref 4.0–10.5)

## 2014-07-08 LAB — URINALYSIS, ROUTINE W REFLEX MICROSCOPIC
BILIRUBIN URINE: NEGATIVE
Glucose, UA: NEGATIVE mg/dL
HGB URINE DIPSTICK: NEGATIVE
Ketones, ur: NEGATIVE mg/dL
LEUKOCYTES UA: NEGATIVE
Nitrite: NEGATIVE
PH: 6 (ref 5.0–8.0)
PROTEIN: NEGATIVE mg/dL
Specific Gravity, Urine: 1.022 (ref 1.005–1.030)
UROBILINOGEN UA: 1 mg/dL (ref 0.0–1.0)

## 2014-07-08 LAB — COMPREHENSIVE METABOLIC PANEL
ALK PHOS: 42 U/L (ref 39–117)
ALT: 14 U/L (ref 0–53)
AST: 23 U/L (ref 0–37)
Albumin: 3.6 g/dL (ref 3.5–5.2)
Anion gap: 7 (ref 5–15)
BILIRUBIN TOTAL: 0.3 mg/dL (ref 0.3–1.2)
BUN: 12 mg/dL (ref 6–23)
CALCIUM: 8.4 mg/dL (ref 8.4–10.5)
CO2: 26 mmol/L (ref 19–32)
Chloride: 101 mEq/L (ref 96–112)
Creatinine, Ser: 1.02 mg/dL (ref 0.50–1.35)
GFR calc Af Amer: >90 mL/min (ref >90)
GFR calc non Af Amer: >90 mL/min (ref >90)
GLUCOSE: 115 mg/dL — AB (ref 70–99)
POTASSIUM: 3.2 mmol/L — AB (ref 3.5–5.1)
Sodium: 134 mmol/L — ABNORMAL LOW (ref 135–145)
TOTAL PROTEIN: 5.6 g/dL — AB (ref 6.0–8.3)

## 2014-07-08 LAB — LIPASE, BLOOD: Lipase: 21 U/L (ref 11–59)

## 2014-07-08 NOTE — ED Notes (Signed)
Patient here with complaint of left upper and lower quadrant abdominal pain which began 4 days ago. Denies nausea, diarrhea, fever. States that pain sometimes the pain radiates through to his back directly behind abd pain.

## 2014-07-09 MED ORDER — CIPROFLOXACIN HCL 500 MG PO TABS: 500.0000 mg | ORAL_TABLET | Freq: Two times a day (BID) | ORAL | Status: DC

## 2014-07-09 MED ORDER — METRONIDAZOLE 500 MG PO TABS: 500.0000 mg | ORAL_TABLET | Freq: Two times a day (BID) | ORAL | Status: DC

## 2014-07-09 MED ORDER — NAPROXEN 500 MG PO TABS: 500.0000 mg | ORAL_TABLET | Freq: Two times a day (BID) | ORAL | Status: DC

## 2014-07-09 MED ORDER — PROMETHAZINE HCL 25 MG PO TABS: 25.0000 mg | ORAL_TABLET | Freq: Four times a day (QID) | ORAL | Status: DC | PRN

## 2014-07-09 NOTE — ED Provider Notes (Signed)
CSN: [id number]     Arrival date & time 07/08/14  2231 History   First MD Initiated Contact with Patient 07/08/14 2323     Chief Complaint  Patient presents with  . Abdominal Pain  . Back Pain     (Consider location/radiation/quality/duration/timing/severity/associated sxs/prior Treatment) HPI Comments: The patient is a 39 year old male, he has a history of frequent visits for dental pain and abdominal pain, presents with 4 days of left abdominal pain with some left back pain, intermittent, comes mostly when he stands, not associated with eating bowel movements or urination. There is no fevers chills nausea vomiting or diarrhea and no blood in the stools. The symptoms have been persistent over the last 4 days though they are intermittent and fluctuating in intensity. He has had a history of appendicitis, no other abdominal surgery.  Patient is a 39 y.o. male presenting with abdominal pain and back pain. The history is provided by the patient.  Abdominal Pain Back Pain Associated symptoms: abdominal pain     Past Medical History  Diagnosis Date  . BPH (benign prostatic hyperplasia)   . Anxiety   . Depression   . ADHD (attention deficit hyperactivity disorder)   . Abdominal pain, recurrent     chronic lower abdominal pain  . Narcotic abuse     opiates  . Chronic pain   . Hypertension    Past Surgical History  Procedure Laterality Date  . Appendectomy  08/2005   Family History  Problem Relation Age of Onset  . Hypertension Father   . Prostate cancer Other    History  Substance Use Topics  . Smoking status: Former Smoker -- 0.50 packs/day    Types: Cigarettes    Quit date: 03/10/2014  . Smokeless tobacco: Never Used  . Alcohol Use: No     Comment: occasion    Review of Systems  Gastrointestinal: Positive for abdominal pain.  Musculoskeletal: Positive for back pain.  All other systems reviewed and are negative.     Allergies  Escitalopram oxalate and Contrast  media  Home Medications   Prior to Admission medications   Medication Sig Start Date End Date Taking? Authorizing Provider  methylphenidate (RITALIN) 10 MG tablet Take 10 mg by mouth daily.   Yes Historical Provider, MD  sertraline (ZOLOFT) 100 MG tablet Take 100 mg by mouth daily.   Yes Historical Provider, MD  tamsulosin (FLOMAX) 0.4 MG CAPS capsule Take 0.4 mg by mouth daily.   Yes Historical Provider, MD  Buprenorphine HCl-Naloxone HCl (SUBOXONE) 8-2 MG FILM Place 1 Film under the tongue 2 (two) times daily.    Historical Provider, MD  ciprofloxacin (CIPRO) 500 MG tablet Take 1 tablet (500 mg total) by mouth 2 (two) times daily. 07/09/14   Vida Roller, MD  dicyclomine (BENTYL) 20 MG tablet Take 1 tablet (20 mg total) by mouth 2 (two) times daily. Patient not taking: Reported on 07/08/2014 03/01/14   Loren Racer, MD  FLUoxetine (PROZAC) 40 MG capsule Take 40 mg by mouth daily.    Historical Provider, MD  metroNIDAZOLE (FLAGYL) 500 MG tablet Take 1 tablet (500 mg total) by mouth 2 (two) times daily. 07/09/14   Vida Roller, MD  naproxen (NAPROSYN) 500 MG tablet Take 1 tablet (500 mg total) by mouth 2 (two) times daily with a meal. 07/09/14   Vida Roller, MD  polyethylene glycol (MIRALAX / Ethelene Hal) packet Take 17 g by mouth daily. Patient not taking: Reported on 07/08/2014 03/01/14  Loren Raceravid Yelverton, MD  promethazine (PHENERGAN) 25 MG tablet Take 1 tablet (25 mg total) by mouth every 6 (six) hours as needed for nausea or vomiting. 07/09/14   Vida RollerBrian D Nolawi Kanady, MD   BP 126/66 mmHg  Pulse 81  Temp(Src) 97.8 F (36.6 C) (Oral)  Resp 10  Ht 5\' 8"  (1.727 m)  Wt 160 lb (72.576 kg)  BMI 24.33 kg/m2  SpO2 99% Physical Exam  Constitutional: He appears well-developed and well-nourished. No distress.  HENT:  Head: Normocephalic and atraumatic.  Mouth/Throat: Oropharynx is clear and moist. No oropharyngeal exudate.  Eyes: Conjunctivae and EOM are normal. Pupils are equal, round, and reactive to  light. Right eye exhibits no discharge. Left eye exhibits no discharge. No scleral icterus.  Neck: Normal range of motion. Neck supple. No JVD present. No thyromegaly present.  Cardiovascular: Normal rate, regular rhythm, normal heart sounds and intact distal pulses.  Exam reveals no gallop and no friction rub.   No murmur heard. Pulmonary/Chest: Effort normal and breath sounds normal. No respiratory distress. He has no wheezes. He has no rales.  Abdominal: Soft. Bowel sounds are normal. He exhibits no distension and no mass. There is tenderness (mild tenderness in the left upper and the left lower quadrant, no guarding, no masses, no peritoneal signs, no pulsating masses).  Musculoskeletal: Normal range of motion. He exhibits no edema or tenderness.  Lymphadenopathy:    He has no cervical adenopathy.  Neurological: He is alert. Coordination normal.  Skin: Skin is warm and dry. No rash noted. No erythema.  Psychiatric: He has a normal mood and affect. His behavior is normal.  Nursing note and vitals reviewed.   ED Course  Procedures (including critical care time) Labs Review Labs Reviewed  COMPREHENSIVE METABOLIC PANEL - Abnormal; Notable for the following:    Sodium 134 (*)    Potassium 3.2 (*)    Glucose, Bld 115 (*)    Total Protein 5.6 (*)    All other components within normal limits  CBC WITH DIFFERENTIAL  LIPASE, BLOOD  URINALYSIS, ROUTINE W REFLEX MICROSCOPIC    Imaging Review No results found.    MDM   Final diagnoses:  Left upper quadrant pain    Bedside ultrasound reveals no signs of hydronephrosis, he does have some reproducible tenderness in the left upper and left lower quadrants, vital signs are normal, blood work normal, urinalysis normal, possibly diverticulitis, otherwise the patient is well-appearing and can be safely discharged to follow-up as an outpatient or return to the hospital if the symptoms should worsen, he is in agreement with the plan. We'll  abstain from opiate medications as the patient has a significant history of pain complaints in the emergency department.  Meds given in ED:  Medications - No data to display  New Prescriptions   CIPROFLOXACIN (CIPRO) 500 MG TABLET    Take 1 tablet (500 mg total) by mouth 2 (two) times daily.   METRONIDAZOLE (FLAGYL) 500 MG TABLET    Take 1 tablet (500 mg total) by mouth 2 (two) times daily.   NAPROXEN (NAPROSYN) 500 MG TABLET    Take 1 tablet (500 mg total) by mouth 2 (two) times daily with a meal.   PROMETHAZINE (PHENERGAN) 25 MG TABLET    Take 1 tablet (25 mg total) by mouth every 6 (six) hours as needed for nausea or vomiting.        Vida RollerBrian D Elanora Quin, MD 07/09/14 [phone]

## 2014-07-09 NOTE — Discharge Instructions (Signed)
Your testing is normal - the blood work and the urine test were normal.  Please call your doctor for a followup appointment within 24-48 hours. When you talk to your doctor please let them know that you were seen in the emergency department and have them acquire all of your records so that they can discuss the findings with you and formulate a treatment plan to fully care for your new and ongoing problems.  RESOURCE GUIDE  Chronic Pain Problems: Contact [facility]  [phone] Patients need to be referred by their primary care doctor.  Insufficient Money for Medicine: Contact [employer]:  call "211."   No Primary Care Doctor: - Call [facility]  [phone] - can help you locate a primary care doctor that  accepts your insurance, provides certain services, etc. - Physician Referral Service- [phone]  Agencies that provide inexpensive medical care: - [facility]  [phone] - [facility]  [phone] - [facility]  [phone] - Women's Clinic  [phone] - [facility]  [phone] [facility]  [phone]  Medicaid-accepting [facility] Providers: - [facility]- [address], [address]  [phone], Mon-Fri 9am-7pm, Sat 9am-1pm - [facility]- [address] [place], [address] [place]  [phone] - [facility]- [address], [address] [place]  [phone] [facility]- [address]  [phone] - Renaye Rakers- [address] [place], [address], [phone]  Only accepts [place] patients after they have their name  applied to their card  Self Pay (no insurance) in [place]: - Sickle Cell Patients: Dr Willey Blade, [facility]  [address] [place], [phone] - [facility]- [address] [place]  [phone]       [facility] [place]- [address]       -     [facility]- see information above (Speak to [employer] if you do not have insurance)       -  [facility]- [address],  [phone]       -  [facility]- [address] [phone]       -  Dr Julio Sicks-  [address], [place], [phone]       -  [facility] - [address] [phone]       -  [facility]- [address] [phone], also [address], [phone]       -    [facility]- [address] [place], [phone], 1st & 3rd Saturday        every month, 10am-1pm  [facility] [address] [place], [place] [address] [phone]  [facility] [address] [place], [place] [address] [phone]  1) Find a Doctor and Pay Out of Pocket Although you won't have to find out who is covered by your insurance plan, it is a good idea to ask around and get recommendations. You will then need to call the office and see if the doctor you have chosen will accept you as a new patient and what types of options they offer for patients who are self-pay. Some doctors offer discounts or will set up payment plans for their patients who do  not have insurance, but you will need to ask so you aren't surprised when you get to your appointment.  2) Contact Your Local Health Department Not all health departments have doctors that can see patients for sick visits, but many do, so it is worth a call to see if yours does. If you don't know where your local health department is, you can check in your phone book. The CDC also has a tool to help you locate your state's health department, and many state websites also have listings of all of their local health departments.  3) Find a Walk-in Clinic If your illness is not likely to be very severe or complicated, you may want to try a walk in clinic. These are popping up all over the country in pharmacies, drugstores, and shopping  centers. They're usually staffed by nurse practitioners or physician assistants that have been trained to treat common illnesses and complaints. They're usually fairly quick and inexpensive. However, if you have serious medical issues or chronic medical problems, these are probably not your best option  STD Testing - [facility] [place], STD Clinic, [address], [place], phone [phone] or [phone].  Monday - Friday, call for an appointment. [facility]- [place] [facility] [place], STD Clinic, [address], [place], phone [phone] or [phone].  Monday - Friday, call for an appointment.  Abuse/Neglect: [facility]- [place] Child Abuse Hotline [phone](336) [phone] [facility] [place] Child Abuse Hotline [phone] (After Hours)  Emergency Shelter:  [facility] [phone]) [phone]  Maternity Homes: - [facility] [place] [phone] - [facility] [phone]  MRSA Hotline #:   (260)789-7026519-711-4422  Dental Assistance If unable to pay or uninsured, contact:  [facility] to become qualified for the adult dental clinic.  Patients with Medicaid: [facility] [phone], [phone] [address], [phone]  If unable to pay, or uninsured, contact [facility] [phone] in [place], [phone] in [facility]) to become qualified for the adult dental clinic  [facility] [address] [place], [place] [address] [phone] [email or web address]  Other [facility] Community [facility] Services: - [facility]- [address], [place], [place], [id number], [phone], [phone], 2nd and 4th Thursday of the month at 6:30am.  10 clients each day by appointment, can sometimes see walk-in patients if someone does not show for an appointment. [facility]- [address], [place], [place], [phone],  [phone] - [facility]- [address], [place], [place], [address], [phone] - [facility] [facility]- [phone] [facility]- [facility]- [phone] [facility]- [facility] [place] [phone]- [phone] -

## 2014-08-29 ENCOUNTER — Emergency Department (HOSPITAL_COMMUNITY)
Admission: EM | Admit: 2014-08-29 | Discharge: 2014-08-29 | Disposition: A | Discharge status: Home / Self Care | Payer: Medicaid Other | Attending: Emergency Medicine | Admitting: Emergency Medicine

## 2014-08-29 ENCOUNTER — Encounter (HOSPITAL_COMMUNITY): Payer: Self-pay | Admitting: Emergency Medicine

## 2014-08-29 ENCOUNTER — Encounter (HOSPITAL_COMMUNITY): Payer: Self-pay

## 2014-08-29 ENCOUNTER — Emergency Department (HOSPITAL_COMMUNITY)
Admission: EM | Admit: 2014-08-29 | Discharge: 2014-08-29 | Disposition: A | Discharge status: Home / Self Care | Payer: Medicaid Other | Source: Home / Self Care | Attending: Emergency Medicine | Admitting: Emergency Medicine

## 2014-08-29 ENCOUNTER — Emergency Department (HOSPITAL_COMMUNITY): Payer: Medicaid Other

## 2014-08-29 DIAGNOSIS — F1410 Cocaine abuse, uncomplicated: Secondary | ICD-10-CM | POA: Insufficient documentation

## 2014-08-29 DIAGNOSIS — I10 Essential (primary) hypertension: Secondary | ICD-10-CM | POA: Insufficient documentation

## 2014-08-29 DIAGNOSIS — F419 Anxiety disorder, unspecified: Secondary | ICD-10-CM | POA: Insufficient documentation

## 2014-08-29 DIAGNOSIS — F329 Major depressive disorder, single episode, unspecified: Secondary | ICD-10-CM | POA: Diagnosis not present

## 2014-08-29 DIAGNOSIS — N400 Enlarged prostate without lower urinary tract symptoms: Secondary | ICD-10-CM | POA: Insufficient documentation

## 2014-08-29 DIAGNOSIS — R569 Unspecified convulsions: Secondary | ICD-10-CM | POA: Insufficient documentation

## 2014-08-29 DIAGNOSIS — Z87891 Personal history of nicotine dependence: Secondary | ICD-10-CM | POA: Insufficient documentation

## 2014-08-29 DIAGNOSIS — Z792 Long term (current) use of antibiotics: Secondary | ICD-10-CM | POA: Insufficient documentation

## 2014-08-29 DIAGNOSIS — R42 Dizziness and giddiness: Secondary | ICD-10-CM

## 2014-08-29 DIAGNOSIS — Z79899 Other long term (current) drug therapy: Secondary | ICD-10-CM | POA: Insufficient documentation

## 2014-08-29 DIAGNOSIS — G8929 Other chronic pain: Secondary | ICD-10-CM | POA: Insufficient documentation

## 2014-08-29 DIAGNOSIS — E876 Hypokalemia: Secondary | ICD-10-CM | POA: Diagnosis not present

## 2014-08-29 DIAGNOSIS — Z791 Long term (current) use of non-steroidal anti-inflammatories (NSAID): Secondary | ICD-10-CM | POA: Diagnosis not present

## 2014-08-29 DIAGNOSIS — Z87438 Personal history of other diseases of male genital organs: Secondary | ICD-10-CM | POA: Diagnosis not present

## 2014-08-29 DIAGNOSIS — R55 Syncope and collapse: Secondary | ICD-10-CM | POA: Insufficient documentation

## 2014-08-29 LAB — I-STAT CHEM 8, ED
BUN: 8 mg/dL (ref 6–23)
CALCIUM ION: 1.04 mmol/L — AB (ref 1.12–1.23)
CHLORIDE: 99 mmol/L (ref 96–112)
CREATININE: 0.8 mg/dL (ref 0.50–1.35)
Glucose, Bld: 170 mg/dL — ABNORMAL HIGH (ref 70–99)
HCT: 48 % (ref 39.0–52.0)
Hemoglobin: 16.3 g/dL (ref 13.0–17.0)
Potassium: 3.1 mmol/L — ABNORMAL LOW (ref 3.5–5.1)
Sodium: 141 mmol/L (ref 135–145)
TCO2: 21 mmol/L (ref 0–100)

## 2014-08-29 LAB — I-STAT TROPONIN, ED: Troponin i, poc: 0 ng/mL (ref 0.00–0.08)

## 2014-08-29 MED ORDER — ONDANSETRON HCL 8 MG PO TABS: 8.0000 mg | ORAL_TABLET | Freq: Three times a day (TID) | ORAL | Status: DC | PRN

## 2014-08-29 MED ORDER — IBUPROFEN 800 MG PO TABS
800.0000 mg | ORAL_TABLET | Freq: Once | ORAL | Status: AC
  Administered 2014-08-29: 800 mg via ORAL
  Filled 2014-08-29: qty 1

## 2014-08-29 MED ORDER — ONDANSETRON 8 MG PO TBDP
8.0000 mg | ORAL_TABLET | Freq: Once | ORAL | Status: AC
  Administered 2014-08-29: 8 mg via ORAL
  Filled 2014-08-29: qty 1

## 2014-08-29 MED ORDER — POTASSIUM CHLORIDE CRYS ER 20 MEQ PO TBCR
40.0000 meq | EXTENDED_RELEASE_TABLET | Freq: Once | ORAL | Status: AC
  Administered 2014-08-29: 40 meq via ORAL
  Filled 2014-08-29: qty 2

## 2014-08-29 MED ORDER — SODIUM CHLORIDE 0.9 % IV BOLUS (SEPSIS)
1000.0000 mL | Freq: Once | INTRAVENOUS | Status: AC
Start: 2014-08-29 — End: 2014-08-29
  Administered 2014-08-29: 1000 mL via INTRAVENOUS

## 2014-08-29 NOTE — Discharge Instructions (Signed)
Return to the ED for new concerns. °

## 2014-08-29 NOTE — Discharge Instructions (Signed)
Dizziness  Dizziness means you feel unsteady or lightheaded. You might feel like you are going to pass out (faint). HOME CARE   Drink enough fluids to keep your pee (urine) clear or pale yellow.  Take your medicines exactly as told by your doctor. If you take blood pressure medicine, always stand up slowly from the lying or sitting position. Hold on to something to steady yourself.  If you need to stand in one place for a long time, move your legs often. Tighten and relax your leg muscles.  Have someone stay with you until you feel okay.  Do not drive or use heavy machinery if you feel dizzy.  Do not drink alcohol. GET HELP RIGHT AWAY IF:   You feel dizzy or lightheaded and it gets worse.  You feel sick to your stomach (nauseous), or you throw up (vomit).  You have trouble talking or walking.  You feel weak or have trouble using your arms, hands, or legs.  You cannot think clearly or have trouble forming sentences.  You have chest pain, belly (abdominal) pain, sweating, or you are short of breath.  Your vision changes.  You are bleeding.  You have problems from your medicine that seem to be getting worse. MAKE SURE YOU:   Understand these instructions.  Will watch your condition.  Will get help right away if you are not doing well or get worse. Document Released: 06/05/2011 Document Revised: 09/08/2011 Document Reviewed: 06/05/2011 [facility] Patient Information 2015 [place], [place]. This information is not intended to replace advice given to you by your health care provider. Make sure you discuss any questions you have with your health care provider.  Seizure, Adult A seizure means there is unusual activity in the brain. A seizure can cause changes in attention or behavior. Seizures often cause shaking (convulsions). Seizures often last from 30 seconds to 2 minutes. HOME CARE   If you are given medicines, take them exactly as told by your doctor.  Keep all doctor  visits as told.  Do not swim or drive until your doctor says it is okay.  Teach others what to do if you have a seizure. They should:  Lay you on the ground.  Put a cushion under your head.  Loosen any tight clothing around your neck.  Turn you on your side.  Stay with you until you get better. GET HELP RIGHT AWAY IF:   The seizure lasts longer than 2 to 5 minutes.  The seizure is very bad.  The person does not wake up after the seizure.  The person's attention or behavior changes. Drive the person to the emergency room or call your local emergency services (911 in [place] MAKE SURE YOU:   Understand these instructions.  Will watch your condition.  Will get help right away if you are not doing well or get worse. Document Released: 12/03/2007 Document Revised: 09/08/2011 Document Reviewed: 06/04/2011 [facility] Patient Information 2015 [place], [place]. This information is not intended to replace advice given to you by your health care provider. Make sure you discuss any questions you have with your health care provider.  As discussed, it is unclear today if your symptoms were truly from passing out or if this was a new onset seizure.  You need to have followup for this with a neurologist.  See the referrals above and call for an appointment with the group that can see you soonest. In the meantime, it is unsafe for you to drive or participate in  any dangerous activities (climbing ladders or any other activity) that could cause severe injury if you have another event.  In the interim, return here if you have any new symptoms.  Avoid giving plasma until this issue is resolved.

## 2014-08-29 NOTE — Progress Notes (Signed)
pcp is [facility] [address] [place] [phone]

## 2014-08-29 NOTE — ED Notes (Signed)
[facility] and was told to get help if having same/similar symptoms.  Pt states that he seeing stars and feels dizzy.

## 2014-08-29 NOTE — ED Provider Notes (Signed)
CSN: [id number]     Arrival date & time 08/29/14  1110 History   First MD Initiated Contact with Patient 08/29/14 1111     Chief Complaint  Patient presents with  . Near Syncope     (Consider location/radiation/quality/duration/timing/severity/associated sxs/prior Treatment) Patient is a 39 y.o. male presenting with near-syncope. The history is provided by the patient and medical records.  Near Syncope  This is a 39 year old male with past medical history sig for anxiety, depression, ADHD, hypertension, substance abuse, presenting to the ED for syncopal episode after donating plasma. Patient states he donates plasma regularly, but has not donated in the past 3 weeks. He states he got dizzy and felt that he was going to pass out and woke up on the floor. There was report of questionable seizure activity, however patient has no history of seizures. There was no post ictal period. Patient also admits he is in rehabilitation for cocaine abuse. He admits to relapse with daily use over the past week. He states because of this he has had very little sleep and very little to eat. He states currently he feels fine, he did have a mild pain in his left shoulder but thinks it was from when he fell on the floor. He denies any headache, dizziness, confusion, numbness, or weakness of extremities. No chest pain or shortness of breath.  BP soft on arrival but stable.  Past Medical History  Diagnosis Date  . BPH (benign prostatic hyperplasia)   . Anxiety   . Depression   . ADHD (attention deficit hyperactivity disorder)   . Abdominal pain, recurrent     chronic lower abdominal pain  . Narcotic abuse     opiates  . Chronic pain   . Hypertension    Past Surgical History  Procedure Laterality Date  . Appendectomy  08/2005   Family History  Problem Relation Age of Onset  . Hypertension Father   . Prostate cancer Other    History  Substance Use Topics  . Smoking status: Former Smoker -- 0.50 packs/day     Types: Cigarettes    Quit date: 03/10/2014  . Smokeless tobacco: Never Used  . Alcohol Use: No     Comment: occasion    Review of Systems  Cardiovascular: Positive for near-syncope.  Neurological: Positive for syncope.  All other systems reviewed and are negative.     Allergies  Escitalopram oxalate and Contrast media  Home Medications   Prior to Admission medications   Medication Sig Start Date End Date Taking? Authorizing Provider  Buprenorphine HCl-Naloxone HCl (SUBOXONE) 8-2 MG FILM Place 1 Film under the tongue 2 (two) times daily.    Historical Provider, MD  ciprofloxacin (CIPRO) 500 MG tablet Take 1 tablet (500 mg total) by mouth 2 (two) times daily. 07/09/14   Vida RollerBrian D Miller, MD  dicyclomine (BENTYL) 20 MG tablet Take 1 tablet (20 mg total) by mouth 2 (two) times daily. Patient not taking: Reported on 07/08/2014 03/01/14   Loren Raceravid Yelverton, MD  FLUoxetine (PROZAC) 40 MG capsule Take 40 mg by mouth daily.    Historical Provider, MD  methylphenidate (RITALIN) 10 MG tablet Take 10 mg by mouth daily.    Historical Provider, MD  metroNIDAZOLE (FLAGYL) 500 MG tablet Take 1 tablet (500 mg total) by mouth 2 (two) times daily. 07/09/14   Vida RollerBrian D Miller, MD  naproxen (NAPROSYN) 500 MG tablet Take 1 tablet (500 mg total) by mouth 2 (two) times daily with a meal. 07/09/14  Vida Roller, MD  polyethylene glycol [facility]) packet Take 17 g by mouth daily. Patient not taking: Reported on 07/08/2014 03/01/14   Loren Racer, MD  promethazine (PHENERGAN) 25 MG tablet Take 1 tablet (25 mg total) by mouth every 6 (six) hours as needed for nausea or vomiting. 07/09/14   Vida Roller, MD  sertraline (ZOLOFT) 100 MG tablet Take 100 mg by mouth daily.    Historical Provider, MD  tamsulosin (FLOMAX) 0.4 MG CAPS capsule Take 0.4 mg by mouth daily.    Historical Provider, MD   BP 100/67 mmHg  Pulse 81  Temp(Src) 97.8 F (36.6 C) (Oral)  Resp 15  Ht  (1.727 m)  Wt 155 lb (70.308  kg)  BMI 23.57 kg/m2  SpO2 100%   Physical Exam  Constitutional: He is oriented to person, place, and time. He appears well-developed and well-nourished.  HENT:  Head: Normocephalic and atraumatic.  Mouth/Throat: Uvula is midline, oropharynx is clear and moist and mucous membranes are normal.  No visible signs of head injury; no tongue lacerations, dentition intact  Eyes: Conjunctivae and EOM are normal. Pupils are equal, round, and reactive to light.  Neck: Normal range of motion.  Cardiovascular: Normal rate, regular rhythm and normal heart sounds.   Pulmonary/Chest: Effort normal and breath sounds normal.  Abdominal: Soft. Bowel sounds are normal.  Musculoskeletal: Normal range of motion.  Donation site in left [facility] wrapped with coban  Neurological: He is alert and oriented to person, place, and time.  AAOx3, answering questions appropriately; equal strength UE and LE bilaterally; CN grossly intact; moves all extremities appropriately without ataxia; no focal neuro deficits or facial asymmetry appreciated  Skin: Skin is warm and dry.  Psychiatric: He has a normal mood and affect.  Nursing note and vitals reviewed.   ED Course  Procedures (including critical care time) Labs Review Labs Reviewed  I-STAT CHEM 8, ED - Abnormal; Notable for the following:    Potassium 3.1 (*)    Glucose, Bld 170 (*)    Calcium, Ion 1.04 (*)    All other components within normal limits  I-STAT TROPOININ, ED  CBG MONITORING, ED    Imaging Review No results found.   EKG Interpretation None      MDM   Final diagnoses:  Syncope, unspecified syncope type  Hypokalemia   39 year old male here with syncope after donating plasma. Seems vasovagal in nature given prodrome prior to event.  On exam, patient is neurologically intact. There was question of seizure activity however there are no tongue laceration, dental injuries, signs of head injury, or post-ictal phase.  Patient also admits to cocaine  use over the past week.  Patient has no current complaints. His EKG is normal sinus rhythm without acute ischemia.  Will obtain basic labs.  IV fluid bolus given for BP.  Of note, patient is continually asking how long he will be in the emergency department and voices that he does not want unnecessary testing.  Lab work with mild hypokalemia at 3.1, replaced here in the ED.  On repeat evaluation patient has unhooked himself from monitor and is attempting to pull out his IV.  I have advised him of test results and he acknowledged understanding.  His BP has improved from initial reading after IV fluids.  Doubt neurologic or cardiac cause of his syncope.  Patient d/c home with close PCP FU.  Discussed plan with patient, he/she acknowledged understanding and agreed with plan of care.  Return precautions given for new or worsening symptoms.  Garlon Hatchet, PA-C 08/29/14 1400  Geoffery Lyons, MD 08/29/14 [phone]

## 2014-08-29 NOTE — ED Notes (Signed)
Pt from substance abuse clinic with syncopal episode x 2.  BP initially 86/50, CBG 166.  Pt reports donating plasma this am.  First syncopal episode had questionable seizure-like activity; no hx of seizures reported.  Pt sts he is rehab center for cocaine abuse.

## 2014-08-29 NOTE — ED Provider Notes (Addendum)
CSN: [id number]     Arrival date & time 08/29/14  1505 History   First MD Initiated Contact with Patient 08/29/14 1646     Chief Complaint  Patient presents with  . aura   . Dizziness     (Consider location/radiation/quality/duration/timing/severity/associated sxs/prior Treatment) The history is provided by the patient and the spouse.   Chandlor Noecker is a 39 y.o. male with a past medical history of ADHD, anxiety and substance abuse currently on suboxone x the past 8 months but endorses he has relapsed and has used cocaine this week.  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 He was sitting at his desk when, according to bystanders,  He started staring and leaning to one side and was not responsive although description was that eyes were open.  He was assisted out of his chair, when he collapsed and had possible seizure activity.  He had no confusion, no urinary or fecal incontinence during this event.  He was seen at [facility] today where his lab tests were stable except for hypokalemia which was replaced.  He went home, but continued to feel weak with dizziness, worsened with standing.  He has had no oral intake today, does have mild nausea which he suspects is from hunger.  No seizure history.  No IV drug use.  Denies focal weakness, headache or confusion.      Past Medical History  Diagnosis Date  . BPH (benign prostatic hyperplasia)   . Anxiety   . Depression   . ADHD (attention deficit hyperactivity disorder)   . Abdominal pain, recurrent     chronic lower abdominal pain  . Narcotic abuse     opiates  . Chronic pain   . Hypertension    Past Surgical History  Procedure Laterality Date  . Appendectomy  08/2005   Family History  Problem Relation Age of Onset  . Hypertension Father   . Prostate cancer Other    History  Substance Use Topics  . Smoking status: Former Smoker -- 0.50 packs/day    Types:  Cigarettes    Quit date: 03/10/2014  . Smokeless tobacco: Never Used  . Alcohol Use: No     Comment: occasion    Review of Systems  Constitutional: Negative for fever.  HENT: Negative for congestion and sore throat.   Eyes: Negative.   Respiratory: Negative for chest tightness and shortness of breath.   Cardiovascular: Negative for chest pain.  Gastrointestinal: Negative for nausea and abdominal pain.  Genitourinary: Negative.   Musculoskeletal: Negative for joint swelling, arthralgias and neck pain.  Skin: Negative.  Negative for rash and wound.  Neurological: Negative for dizziness, weakness, light-headedness, numbness and headaches.  Psychiatric/Behavioral: Negative.       Allergies  Escitalopram oxalate and Contrast media  Home Medications   Prior to Admission medications   Medication Sig Start Date End Date Taking? Authorizing Provider  Buprenorphine HCl-Naloxone HCl (SUBOXONE) 8-2 MG FILM Place 1 Film under the tongue 2 (two) times daily.   Yes Historical Provider, MD  FLUoxetine (PROZAC) 40 MG capsule Take 40 mg by mouth daily.   Yes Historical Provider, MD  methylphenidate (RITALIN) 10 MG tablet Take 10 mg by mouth daily.   Yes Historical Provider, MD  sertraline (ZOLOFT) 100 MG tablet Take 100 mg by mouth daily.   Yes Historical Provider, MD  tamsulosin (FLOMAX) 0.4 MG CAPS capsule Take 0.4 mg by mouth daily.   Yes  Historical Provider, MD  ciprofloxacin (CIPRO) 500 MG tablet Take 1 tablet (500 mg total) by mouth 2 (two) times daily. Patient not taking: Reported on 08/29/2014 07/09/14   Vida RollerBrian D Miller, MD  dicyclomine (BENTYL) 20 MG tablet Take 1 tablet (20 mg total) by mouth 2 (two) times daily. Patient not taking: Reported on 07/08/2014 03/01/14   Loren Raceravid Yelverton, MD  metroNIDAZOLE (FLAGYL) 500 MG tablet Take 1 tablet (500 mg total) by mouth 2 (two) times daily. Patient not taking: Reported on 08/29/2014 07/09/14   Vida RollerBrian D Miller, MD  naproxen (NAPROSYN) 500 MG tablet Take 1  tablet (500 mg total) by mouth 2 (two) times daily with a meal. Patient not taking: Reported on 08/29/2014 07/09/14   Vida RollerBrian D Miller, MD  ondansetron (ZOFRAN) 8 MG tablet Take 1 tablet (8 mg total) by mouth every 8 (eight) hours as needed for nausea or vomiting. 08/29/14   Burgess AmorJulie Navaya Wiatrek, PA-C  polyethylene glycol (MIRALAX / GLYCOLAX) packet Take 17 g by mouth daily. Patient not taking: Reported on 07/08/2014 03/01/14   Loren Raceravid Yelverton, MD  promethazine (PHENERGAN) 25 MG tablet Take 1 tablet (25 mg total) by mouth every 6 (six) hours as needed for nausea or vomiting. Patient not taking: Reported on 08/29/2014 07/09/14   Vida RollerBrian D Miller, MD   BP 156/75 mmHg  Pulse 102  Temp(Src) 97.9 F (36.6 C) (Oral)  Resp 19  SpO2 98% Physical Exam  Constitutional: He appears well-developed and well-nourished.  HENT:  Head: Normocephalic and atraumatic.  Mouth/Throat: Oropharynx is clear and moist. No lacerations.  Eyes: Conjunctivae are normal.  Neck: Normal range of motion.  Cardiovascular: Normal rate, regular rhythm, normal heart sounds and intact distal pulses.   Pulmonary/Chest: Effort normal and breath sounds normal. He has no wheezes.  Abdominal: Soft. Bowel sounds are normal. There is no tenderness.  Musculoskeletal: Normal range of motion.  Neurological: He is alert. He has normal strength. No cranial nerve deficit or sensory deficit. He exhibits normal muscle tone. He displays no seizure activity. GCS eye subscore is 4. GCS verbal subscore is 5. GCS motor subscore is 6.  Equal grip strength.    Skin: Skin is warm and dry.  Psychiatric: He has a normal mood and affect.  Nursing note and vitals reviewed.   ED Course  Procedures (including critical care time) Labs Review Labs Reviewed - No data to display  Imaging Review Ct Head Wo Contrast  08/29/2014   CLINICAL DATA:  Acute onset dizziness  EXAM: CT HEAD WITHOUT CONTRAST  TECHNIQUE: Contiguous axial images were obtained from the base of the skull  through the vertex without intravenous contrast.  COMPARISON:  None.  FINDINGS: The ventricles are normal in size and configuration. There is no mass, hemorrhage, extra-axial fluid collection, or midline shift. Gray-white compartments are normal. No acute infarct apparent. Bony calvarium appears intact. The mastoid air cells are clear.  IMPRESSION: Study within normal limits. No intracranial mass, hemorrhage, or focal gray -white compartment lesion/acute appearing infarct.   Electronically Signed   By: Bretta BangWilliam  Woodruff III M.D.   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 Pt was given referral to neurology - advised he should return here for any new or worsened sx.  Also discussed that he cannot drive and to avoid any  dangerous activities until seen by neurology and cleared for driving.  This may have been simple syncope, but cannot r/o possible new onset seizure. He maintained nausea during ed visit although tolerated po intake, zofran prescribed for home use.  Advised to avoid further plasma donation until re-evaluated.    Burgess Amor, PA-C 08/29/14 1949  Tilden Fossa, MD 08/29/14 2001  Burgess Amor, PA-C 09/11/14 2355  Tilden Fossa, MD 09/12/14 [phone]

## 2014-10-10 ENCOUNTER — Emergency Department (HOSPITAL_COMMUNITY)
Admission: EM | Admit: 2014-10-10 | Discharge: 2014-10-10 | Disposition: A | Discharge status: Home / Self Care | Payer: Medicaid Other | Attending: Emergency Medicine | Admitting: Emergency Medicine

## 2014-10-10 ENCOUNTER — Encounter (HOSPITAL_COMMUNITY): Payer: Self-pay | Admitting: Emergency Medicine

## 2014-10-10 DIAGNOSIS — F1110 Opioid abuse, uncomplicated: Secondary | ICD-10-CM | POA: Insufficient documentation

## 2014-10-10 DIAGNOSIS — Z79899 Other long term (current) drug therapy: Secondary | ICD-10-CM | POA: Insufficient documentation

## 2014-10-10 DIAGNOSIS — Z87891 Personal history of nicotine dependence: Secondary | ICD-10-CM | POA: Diagnosis not present

## 2014-10-10 DIAGNOSIS — F1910 Other psychoactive substance abuse, uncomplicated: Secondary | ICD-10-CM

## 2014-10-10 DIAGNOSIS — Z792 Long term (current) use of antibiotics: Secondary | ICD-10-CM | POA: Diagnosis not present

## 2014-10-10 DIAGNOSIS — I10 Essential (primary) hypertension: Secondary | ICD-10-CM | POA: Insufficient documentation

## 2014-10-10 DIAGNOSIS — F419 Anxiety disorder, unspecified: Secondary | ICD-10-CM | POA: Insufficient documentation

## 2014-10-10 DIAGNOSIS — N400 Enlarged prostate without lower urinary tract symptoms: Secondary | ICD-10-CM | POA: Diagnosis not present

## 2014-10-10 DIAGNOSIS — F1010 Alcohol abuse, uncomplicated: Secondary | ICD-10-CM | POA: Insufficient documentation

## 2014-10-10 DIAGNOSIS — F329 Major depressive disorder, single episode, unspecified: Secondary | ICD-10-CM | POA: Diagnosis not present

## 2014-10-10 DIAGNOSIS — G8929 Other chronic pain: Secondary | ICD-10-CM | POA: Diagnosis not present

## 2014-10-10 MED ORDER — ONDANSETRON HCL 4 MG PO TABS: 4.0000 mg | ORAL_TABLET | Freq: Four times a day (QID) | ORAL | Status: DC

## 2014-10-10 MED ORDER — CLONIDINE HCL 0.1 MG PO TABS
0.1000 mg | ORAL_TABLET | Freq: Once | ORAL | Status: AC
  Administered 2014-10-10: 0.1 mg via ORAL
  Filled 2014-10-10: qty 1

## 2014-10-10 MED ORDER — ONDANSETRON 4 MG PO TBDP
8.0000 mg | ORAL_TABLET | Freq: Once | ORAL | Status: AC
  Administered 2014-10-10: 8 mg via ORAL
  Filled 2014-10-10: qty 2

## 2014-10-10 MED ORDER — CLONIDINE HCL 0.2 MG PO TABS: 0.2000 mg | ORAL_TABLET | Freq: Two times a day (BID) | ORAL | Status: DC

## 2014-10-10 NOTE — Discharge Instructions (Signed)
°Emergency Department Resource Guide °1) Find a Doctor and Pay Out of Pocket °Although you won't have to find out who is covered by your insurance plan, it is a good idea to ask around and get recommendations. You will then need to call the office and see if the doctor you have chosen will accept you as a new patient and what types of options they offer for patients who are self-pay. Some doctors offer discounts or will set up payment plans for their patients who do not have insurance, but you will need to ask so you aren't surprised when you get to your appointment. ° °2) Contact Your Local Health Department °Not all health departments have doctors that can see patients for sick visits, but many do, so it is worth a call to see if yours does. If you don't know where your local health department is, you can check in your phone book. The CDC also has a tool to help you locate your state's health department, and many state websites also have listings of all of their local health departments. ° °3) Find a Walk-in Clinic °If your illness is not likely to be very severe or complicated, you may want to try a walk in clinic. These are popping up all over the country in pharmacies, drugstores, and shopping centers. They're usually staffed by nurse practitioners or physician assistants that have been trained to treat common illnesses and complaints. They're usually fairly quick and inexpensive. However, if you have serious medical issues or chronic medical problems, these are probably not your best option. ° °No Primary Care Doctor: °- Call [facility] at  832-8000 - they can help you locate a primary care doctor that  accepts your insurance, provides certain services, etc. °- Physician Referral Service- 1-800-533-3463 ° °Chronic Pain Problems: °Organization         Address  Phone   Notes  °[facility]  (336) 297-2271 Patients need to be referred by their primary care doctor.  ° °Medication  Assistance: °Organization         Address  Phone   Notes  °[place] [facility] [address] °[place], [place] [address] (336) 641-8030 --Must be a resident of [place] °-- Must have NO insurance coverage whatsoever (no Medicaid/ Medicare, etc.) °-- The pt. MUST have a primary care doctor that directs their care regularly and follows them in the community °  °[facility]  (866) 331-1348   °[employer]  (888) 892-1162   ° °Agencies that provide inexpensive medical care: °Organization         Address  Phone   Notes  °[facility]  (336) 832-8035   °[facility]    (336) 832-7272   °[facility] [address] °[place], [place] [address] (336) 832-4777   °[facility] [address], °[place] (336) 271-4999   °[facility]    (336) 373-0678   °[facility]    (336) 272-1050   °[facility] ° [address], [place] Phone:  (336) 832-4444, Fax:  (336) 832-4440 Hours of Operation:  9 am - 6 pm, M-F.  Also accepts Medicaid/Medicare and self-pay.  ° [facility] ° [address], [place] Phone: (336) 832-3150, Fax: (336) [phone]. Hours of Operation:  8:30 am - 5:30 pm, M-F.  Also accepts Medicaid and self-pay.  °[facility] [place] [address], [place] Phone: (336) 878-6027   °[facility] [address], [place], [place] (336)723-1848, Ext. 123 Mondays & Thursdays: 7-9 AM.  First 15 patients are seen on a first come, first serve basis. °  ° °Medicaid-accepting [place] Providers: ° °Organization         Address  Phone   Notes  °[facility] [address], [place] (336) 641-2100 Also accepts self-pay patients.  °[facility] [address], [place] ° (336) 856-9996   °[facility] [address], [place]  (336) 288-8857   °[facility] [address] [place] [address], [place] (336) 299-7000   °Veita Bland [address], [place]  ° (336) 373-1557 Only accepts [place] Access Medicaid patients after they have their name applied to their card.  ° °Self-Pay (no insurance) in [place]: ° °Organization         Address  Phone   Notes  °Sickle Cell Patients, [facility] [address], [place] (336) 832-1970   °[facility] [address], [place] (336) 832-4400   °[facility] ° [address], [place] (336) 992-4800   °[facility]/Dr. Osei-Bonsu ° [address] [place] [address], [place] or [address], [place] (336) [phone] Phone number for both [place] and [place] locations is the same.  °[facility] [address], [place] (336) 299-0000   °[facility] [address] [place] [address], [place] or [address] (336) 852-7530 °(336) 878-2260   °[facility] [address], [place] (336) 350-1642, phone; (336) 294-5005, fax Sees patients 1st and 3rd Saturday of every month.  Must not qualify for public or private insurance (i.e. Medicaid, Medicare, [place] Health Choice, Veterans' Benefits) • Household income should be no more than 200% of the poverty level •The clinic cannot treat you if you are pregnant or think you are pregnant • Sexually transmitted diseases are not treated at the clinic.  ° ° °Dental Care: °Organization         Address  Phone  Notes  °[place] [facility] [address], [place] (336) 641-6152 Accepts children up to age 21 who are enrolled in Medicaid or [place] Health Choice; pregnant women with a Medicaid card; and children who have applied for Medicaid or [place] Health Choice, but were declined, whose parents can pay a reduced fee at time of service.  °[place]  [facility] [place]  [address], [place] (336) 641-7733 Accepts children up to age 21 who are enrolled in Medicaid or [place] Health Choice; pregnant women with a Medicaid card; and children who have applied for Medicaid or [place] Health Choice, but were declined, whose parents can pay a reduced fee at time of service.  °[facility] ° [address],  (336) 641-4533 Patients are seen by appointment only. Walk-ins are not accepted. [facility] will see patients 18 years of age and older. °Monday - Tuesday (8am-5pm) °Most Wednesdays (8:30-5pm) °$30 per visit, cash only  °[facility] ° [address], [place] (336) 641-4533 Patients are seen by appointment only. Walk-ins are not accepted. [facility] will see patients 18 years of age and older. °One   Wednesday Evening (Monthly: Volunteer Based).  $30 per visit, cash only  °[facility]  (919) 537-3737 for adults; Children under age 4, call Graduate Pediatric Dentistry at ([phone]) [phone]. Children aged 4-14, please call (919) [phone] to request a pediatric application. ° Dental services are provided in all areas of dental care including fillings, crowns and bridges, complete and partial dentures, implants, gum treatment, root canals, and extractions. Preventive care is also provided. Treatment is provided to both adults and children. °Patients are selected via a lottery and there is often a waiting list. °  °[facility] [address], °[place] ° (336) 763-8833 [email or web address] °  °[facility] [address], [place], [place] (336)723-1848, Ext. 123 Second and Fourth Thursday of each month, opens at 6:30 AM; Clinic ends at 9 AM.  Patients are seen on a first-come first-served basis, and a limited number are seen during each clinic.  ° °[facility] ° [address], [place], [place] (336) 723-7904    Eligibility Requirements °You must have lived in [place], [place], or [place] counties for at least the last three months. °  You cannot be eligible for state or federal sponsored healthcare insurance, including Veterans Administration, Medicaid, or Medicare. °  You generally cannot be eligible for healthcare insurance through your employer.  °  How to apply: °Eligibility screenings are held every Tuesday and Wednesday afternoon from 1:00 pm until 4:00 pm. You do not need an appointment for the interview!  °[facility] [address], [place], [place] 336-631-2330   °[facility]  336-342-8273   °[facility]  336-703-3100   °[facility]  336-570-6415   ° °Behavioral Health Resources in the Community: °Intensive Outpatient Programs °Organization         Address  Phone  Notes  °[facility] [address], [place], [place] 336-878-6098   °[facility] [address], [place], [place] 336-832-9800   °[facility] [address], [place], [place] ° 336-882-2125   °[facility] [address],  °[place], [place] 1-800-853-5163 or 336-641-4981   °Substance Abuse Resources °Organization         Address  Phone  Notes  °[facility]  336-882-2125   °[facility]  336-784-9470   °[facility]  336-285-9073   °[facility]  336-845-3988   °Residential & Outpatient Substance Abuse Program  1-800-659-3381   °Psychological Services °Organization         Address  Phone  Notes  °[facility]  336- 832-9600   °[facility]  336- 378-7881   °[facility] [address], [place] 1-800-853-5163 or 336-641-4981   ° °Mobile Crisis Teams °Organization         Address  Phone  Notes  °[facility], Mobile Crisis Care Unit  1-877-626-1772   °Assertive °[facility] ° [address]  [place], [place] 336-834-9664   °Sharon DeEsch [address] °[place]  336-554-5454   ° °Self-Help/Support Groups °Organization         Address  Phone             Notes  °[facility] - variety of support groups  336- 373-1402 Call for more information  °Narcotics Anonymous (NA), [facility] [address], °[place]   2 meetings at this location  ° °  Residential Treatment Programs °Organization         Address  Phone  Notes  °[facility] [address],    °[place]  1-866-801-8205   °[facility] ° [address], [place], [place] 704-293-8524   °[facility] [address], [place] 336-845-3988 Admissions: 8am-3pm M-F  °[facility] [address]    °[place], [place] 336-841-1104   °[facility] [address], [place], [place] 336-379-7146   °[facility] [address]  °[place], [place] 336-285-9073   °[facility] - Intensive Outpatient [address], [place], [place] 336-852-3033   °[facility] [address]  °[place], [place] 1-877-615-2722 or 336-784-9470   °[facility] [address] [place], [place] 336-227-7417 Accepts Medicaid  °[facility] [address]  °[place] 1-800-659-3381 Substance Abuse/Addiction Treatment  ° °[place] Behavioral Health Resources °Organization         Address  Phone  Notes  °[facility]  (888) 581-9988   °Julie Brannon, PhD [address] [place], [place]   (336) 349-5553 or (336) 951-0000   °[facility]   [address] °[place], [place] (336) 349-4454   °[facility] [address], [place], [place] (336) 342-8316 Insurance/Medicaid/sponsorship through [facility]  °[facility] [address] [place] [address]                                    [place], [place] (336) 342-8316 Therapy/tele-psych/case    °[facility] [address]  ° [place], [place] (336) 349-2233    °Dr. Arfeen  (336) 349-4544   °[facility] [place]  [employer] [place] [facility] 1) [address], [place] °2) [address], [place] °3)  [address], [place] (336) 349-3220 °(336) 342-7768 ° °(336) 342-8140   °[place] [facility] (336) 342-1394 or (336) 342-3537 (After Hours)    ° ° °

## 2014-10-10 NOTE — ED Provider Notes (Signed)
CSN: [id number]     Arrival date & time 10/10/14  2001 History   First MD Initiated Contact with Patient 10/10/14 2134     Chief Complaint  Patient presents with  . Addiction Problem    (Consider location/radiation/quality/duration/timing/severity/associated sxs/prior Treatment) HPI Comments: Patient is a 39 year old male with a history of ADHD, narcotic abuse, and chronic pain. He presents to the emergency department for further evaluation for detox from opioids. Patient reports that he was followed by a Suboxone clinic up until 1 month ago secondary to the use of other illicit substance. Patient states that he has been using 5 mg oxycodone tablets since this time. He reports use of approximately 20 tablets per day. He states he used cocaine yesterday AM and smoked marijuana. He denies ETOH use. No SI/HI or hallucinations. He states he is feeling nauseous and anxious. Last opioid use was yesterday AM.  The history is provided by the patient. No language interpreter was used.    Past Medical History  Diagnosis Date  . BPH (benign prostatic hyperplasia)   . Anxiety   . Depression   . ADHD (attention deficit hyperactivity disorder)   . Abdominal pain, recurrent     chronic lower abdominal pain  . Narcotic abuse     opiates  . Chronic pain   . Hypertension    Past Surgical History  Procedure Laterality Date  . Appendectomy  08/2005   Family History  Problem Relation Age of Onset  . Hypertension Father   . Prostate cancer Other    History  Substance Use Topics  . Smoking status: Former Smoker -- 0.50 packs/day    Types: Cigarettes    Quit date: 03/10/2014  . Smokeless tobacco: Never Used  . Alcohol Use: No     Comment: occasion    Review of Systems  Gastrointestinal: Positive for nausea.  Psychiatric/Behavioral: Positive for behavioral problems (opioid addiction). Negative for suicidal ideas. The patient is nervous/anxious.   All other systems reviewed and are  negative.   Allergies  Escitalopram oxalate and Contrast media  Home Medications   Prior to Admission medications   Medication Sig Start Date End Date Taking? Authorizing Provider  Buprenorphine HCl-Naloxone HCl (SUBOXONE) 8-2 MG FILM Place 1 Film under the tongue 2 (two) times daily.    Historical Provider, MD  ciprofloxacin (CIPRO) 500 MG tablet Take 1 tablet (500 mg total) by mouth 2 (two) times daily. Patient not taking: Reported on 08/29/2014 07/09/14   Eber HongBrian Miller, MD  cloNIDine (CATAPRES) 0.2 MG tablet Take 1 tablet (0.2 mg total) by mouth 2 (two) times daily. For anxiety from withdrawals 10/10/14   Antony MaduraKelly Landynn Dupler, PA-C  dicyclomine (BENTYL) 20 MG tablet Take 1 tablet (20 mg total) by mouth 2 (two) times daily. Patient not taking: Reported on 07/08/2014 03/01/14   Loren Raceravid Yelverton, MD  FLUoxetine (PROZAC) 40 MG capsule Take 40 mg by mouth daily.    Historical Provider, MD  methylphenidate (RITALIN) 10 MG tablet Take 10 mg by mouth daily.    Historical Provider, MD  metroNIDAZOLE (FLAGYL) 500 MG tablet Take 1 tablet (500 mg total) by mouth 2 (two) times daily. Patient not taking: Reported on 08/29/2014 07/09/14   Eber HongBrian Miller, MD  naproxen (NAPROSYN) 500 MG tablet Take 1 tablet (500 mg total) by mouth 2 (two) times daily with a meal. Patient not taking: Reported on 08/29/2014 07/09/14   Eber HongBrian Miller, MD  ondansetron (ZOFRAN) 4 MG tablet Take 1 tablet (4 mg total) by mouth every  6 (six) hours. 10/10/14   Antony Madura, PA-C  polyethylene glycol (MIRALAX / GLYCOLAX) packet Take 17 g by mouth daily. Patient not taking: Reported on 07/08/2014 03/01/14   Loren Racer, MD  promethazine (PHENERGAN) 25 MG tablet Take 1 tablet (25 mg total) by mouth every 6 (six) hours as needed for nausea or vomiting. Patient not taking: Reported on 08/29/2014 07/09/14   Eber Hong, MD  sertraline (ZOLOFT) 100 MG tablet Take 100 mg by mouth daily.    Historical Provider, MD  tamsulosin (FLOMAX) 0.4 MG CAPS capsule Take 0.4 mg by  mouth daily.    Historical Provider, MD   BP 110/63 mmHg  Pulse 94  Temp(Src) 97.9 F (36.6 C) (Oral)  Resp 16  Ht  (1.727 m)  Wt 152 lb (68.947 kg)  BMI 23.12 kg/m2  SpO2 100%   Physical Exam  Constitutional: He is oriented to person, place, and time. He appears well-developed and well-nourished. No distress.  Nontoxic/nonseptic appearing  HENT:  Head: Normocephalic and atraumatic.  Eyes: Conjunctivae and EOM are normal. No scleral icterus.  Neck: Normal range of motion.  Cardiovascular: Normal rate, regular rhythm and intact distal pulses.   Pulmonary/Chest: Effort normal. No respiratory distress.  Respirations even and unlabored  Musculoskeletal: Normal range of motion.  Neurological: He is alert and oriented to person, place, and time. He exhibits normal muscle tone. Coordination normal.  GCS 15. Speech is goal oriented. Patient moves extremities without ataxia.  Skin: Skin is warm and dry. No rash noted. He is not diaphoretic. No erythema. No pallor.  Psychiatric: He has a normal mood and affect. His behavior is normal.  Nursing note and vitals reviewed.   ED Course  Procedures (including critical care time) Labs Review Labs Reviewed - No data to display  Imaging Review No results found.   EKG Interpretation None      MDM   Final diagnoses:  Polysubstance abuse    39 year old male presents to the emergency department requesting detox from opioids. Patient reports the use of oxycodone and cocaine, last use yesterday morning. Patient denies SI/HI and hallucinations. No complicating features. I believe the patient can be managed appropriately as an outpatient. Resource guide provided as well as prescriptions for Zofran and clonidine for withdrawal symptoms. Return precautions given. Patient agreeable to plan with no unaddressed concerns. Patient discharged in good condition.   Filed Vitals:   10/10/14 2015 10/10/14 2209 10/10/14 2213  BP: 105/78 110/63  110/63  Pulse: 98  94  Temp: 97.7 F (36.5 C)  97.9 F (36.6 C)  TempSrc: Oral  Oral  Resp: 14  16  Height:  (1.727 m)    Weight: 152 lb (68.947 kg)    SpO2: 100%  100%       Antony Madura, PA-C 10/10/14 2224  Nelva Nay, MD 10/13/14 [phone]

## 2014-10-10 NOTE — ED Notes (Signed)
Pt. requesting detox for his addiction to Oxycodone , denies suicidal ideation / no hallucinations . Last oxycodone intake yesterday .

## 2015-08-10 MED FILL — ZUBSOLV 5.7-1.4 MG TAB SL: 5.7-1.4 | 15 days supply | Qty: 45 | Fill #0

## 2015-08-16 ENCOUNTER — Encounter (HOSPITAL_COMMUNITY): Payer: Self-pay

## 2015-08-16 ENCOUNTER — Emergency Department (HOSPITAL_COMMUNITY)
Admission: EM | Admit: 2015-08-16 | Discharge: 2015-08-16 | Disposition: A | Discharge status: Home / Self Care | Payer: Medicaid Other | Attending: Emergency Medicine | Admitting: Emergency Medicine

## 2015-08-16 DIAGNOSIS — I10 Essential (primary) hypertension: Secondary | ICD-10-CM | POA: Diagnosis not present

## 2015-08-16 DIAGNOSIS — G8929 Other chronic pain: Secondary | ICD-10-CM | POA: Diagnosis not present

## 2015-08-16 DIAGNOSIS — R109 Unspecified abdominal pain: Secondary | ICD-10-CM | POA: Insufficient documentation

## 2015-08-16 NOTE — ED Notes (Signed)
Pt here with left flank pain starting Monday.  Slight difficulty with urination.  No hx of kidney stone.

## 2015-08-16 NOTE — ED Notes (Signed)
No answer from waiting room.

## 2015-08-16 NOTE — ED Notes (Signed)
Called for patient x2. No answer.

## 2015-08-17 ENCOUNTER — Emergency Department (INDEPENDENT_AMBULATORY_CARE_PROVIDER_SITE_OTHER)
Admission: EM | Admit: 2015-08-17 | Discharge: 2015-08-17 | Disposition: A | Discharge status: Home / Self Care | Payer: Medicaid Other | Source: Home / Self Care | Attending: Family Medicine | Admitting: Family Medicine

## 2015-08-17 ENCOUNTER — Encounter (HOSPITAL_COMMUNITY): Payer: Self-pay | Admitting: Emergency Medicine

## 2015-08-17 ENCOUNTER — Emergency Department (INDEPENDENT_AMBULATORY_CARE_PROVIDER_SITE_OTHER): Payer: Medicaid Other

## 2015-08-17 DIAGNOSIS — R109 Unspecified abdominal pain: Secondary | ICD-10-CM

## 2015-08-17 DIAGNOSIS — R1010 Upper abdominal pain, unspecified: Secondary | ICD-10-CM

## 2015-08-17 LAB — POCT URINALYSIS DIP (DEVICE)
BILIRUBIN URINE: NEGATIVE
Glucose, UA: NEGATIVE mg/dL
Hgb urine dipstick: NEGATIVE
Ketones, ur: NEGATIVE mg/dL
Leukocytes, UA: NEGATIVE
NITRITE: NEGATIVE
PH: 5.5 (ref 5.0–8.0)
Protein, ur: NEGATIVE mg/dL
Specific Gravity, Urine: 1.025 (ref 1.005–1.030)
UROBILINOGEN UA: 0.2 mg/dL (ref 0.0–1.0)

## 2015-08-17 MED ORDER — CYCLOBENZAPRINE HCL 5 MG PO TABS: 5.0000 mg | ORAL_TABLET | Freq: Three times a day (TID) | ORAL | Status: DC | PRN

## 2015-08-17 NOTE — ED Notes (Signed)
Left flank pain since 2/13.  Pain fluctuates, but never gone.

## 2015-08-17 NOTE — ED Provider Notes (Signed)
CSN: [id number]     Arrival date & time 08/17/15  1403 History   First MD Initiated Contact with Patient 08/17/15 1519     Chief Complaint  Patient presents with  . Flank Pain   (Consider location/radiation/quality/duration/timing/severity/associated sxs/prior Treatment) Patient is a 40 y.o. male presenting with flank pain. The history is provided by the patient.  Flank Pain This is a new problem. The current episode started more than 2 days ago. The problem occurs constantly. The problem has been gradually worsening. Associated symptoms include abdominal pain.    Past Medical History  Diagnosis Date  . BPH (benign prostatic hyperplasia)   . Anxiety   . Depression   . ADHD (attention deficit hyperactivity disorder)   . Abdominal pain, recurrent     chronic lower abdominal pain  . Narcotic abuse     opiates  . Chronic pain   . Hypertension    Past Surgical History  Procedure Laterality Date  . Appendectomy  08/2005   Family History  Problem Relation Age of Onset  . Hypertension Father   . Prostate cancer Other    Social History  Substance Use Topics  . Smoking status: Former Smoker -- 0.50 packs/day    Types: Cigarettes    Quit date: 03/10/2014  . Smokeless tobacco: Never Used  . Alcohol Use: No     Comment: occasion    Review of Systems  Constitutional: Negative.   Gastrointestinal: Positive for nausea and abdominal pain. Negative for blood in stool.  Genitourinary: Positive for flank pain. Negative for dysuria, urgency, frequency, hematuria and difficulty urinating.    Allergies  Escitalopram oxalate and Contrast media  Home Medications   Prior to Admission medications   Medication Sig Start Date End Date Taking? Authorizing Provider  aspirin EC 81 MG tablet Take 162 mg by mouth at bedtime.    Historical Provider, MD  cyclobenzaprine (FLEXERIL) 5 MG tablet Take 1 tablet (5 mg total) by mouth 3 (three) times daily as needed for muscle spasms. 08/17/15   Linna Hoff, MD  FLUoxetine (PROZAC) 40 MG capsule Take 40 mg by mouth daily.    Historical Provider, MD  methylphenidate (RITALIN) 10 MG tablet Take 10 mg by mouth 2 (two) times daily.     Historical Provider, MD  naproxen sodium (ANAPROX) 220 MG tablet Take 440 mg by mouth daily as needed (headaches).    Historical Provider, MD  tamsulosin (FLOMAX) 0.4 MG CAPS capsule Take 0.4 mg by mouth daily.    Historical Provider, MD  ZUBSOLV 5.7-1.4 MG SUBL Place 1.5-3 tablets under the tongue daily.  08/10/15   Historical Provider, MD   Meds Ordered and Administered this Visit  Medications - No data to display  BP 118/67 mmHg  Pulse 90  Temp(Src) 97.8 F (36.6 C) (Oral)  Resp 14  SpO2 100% No data found.   Physical Exam  Constitutional: He is oriented to person, place, and time. He appears well-developed and well-nourished. No distress.  Neck: Normal range of motion. Neck supple.  Cardiovascular: Regular rhythm and normal heart sounds.   Pulmonary/Chest: Effort normal and breath sounds normal.  Abdominal: Soft. Bowel sounds are normal. He exhibits no distension and no mass. There is tenderness. There is no rebound and no guarding.  Neurological: He is alert and oriented to person, place, and time.  Skin: Skin is warm and dry.  Nursing note and vitals reviewed.   ED Course  Procedures (including critical care time)  Labs Review  Labs Reviewed  POCT URINALYSIS DIP (DEVICE)   U/a neg.  Imaging Review Dg Abd Acute W/chest  08/17/2015  CLINICAL DATA:  Left flank pain EXAM: DG ABDOMEN ACUTE W/ 1V CHEST COMPARISON:  03/01/2014 FINDINGS: Cardiomediastinal silhouette is stable. No acute infiltrate or pleural effusion. No pulmonary edema. There is normal small bowel gas pattern. Moderate stool noted throughout the colon. Bony structures are unremarkable. No free abdominal air. IMPRESSION: No acute disease within chest. Normal small bowel gas pattern. Moderate colonic stool. Electronically Signed    By: Natasha [place] M.D.   On: 08/17/2015 16:25   X-rays reviewed and report per radiologist.   Visual Acuity Review  Right Eye Distance:   Left Eye Distance:   Bilateral Distance:    Right Eye Near:   Left Eye Near:    Bilateral Near:         MDM   1. Acute flank pain    Meds ordered this encounter  Medications  . cyclobenzaprine (FLEXERIL) 5 MG tablet    Sig: Take 1 tablet (5 mg total) by mouth 3 (three) times daily as needed for muscle spasms.    Dispense:  30 tablet    Refill:  0        Linna Hoff, MD 08/17/15 [phone]

## 2015-08-17 NOTE — Discharge Instructions (Signed)
See your doctor if further problems. °

## 2015-08-24 MED FILL — SUBOXONE 8 MG-2 MG SL FILM: 8-2 | 14 days supply | Qty: 42 | Fill #0

## 2015-09-06 MED FILL — SUBOXONE 8 MG-2 MG SL FILM: 8-2 | 30 days supply | Qty: 90 | Fill #0

## 2015-10-05 MED FILL — SUBOXONE 8 MG-2 MG SL FILM: 8-2 | 28 days supply | Qty: 84 | Fill #0

## 2015-10-29 MED FILL — SUBOXONE 8 MG-2 MG SL FILM: 8-2 | 28 days supply | Qty: 84 | Fill #0

## 2015-11-01 ENCOUNTER — Encounter (HOSPITAL_COMMUNITY): Payer: Self-pay | Admitting: Emergency Medicine

## 2015-11-01 ENCOUNTER — Emergency Department (HOSPITAL_COMMUNITY)
Admission: EM | Admit: 2015-11-01 | Discharge: 2015-11-01 | Disposition: A | Discharge status: Home / Self Care | Payer: Medicaid Other | Attending: Emergency Medicine | Admitting: Emergency Medicine

## 2015-11-01 DIAGNOSIS — M79605 Pain in left leg: Secondary | ICD-10-CM | POA: Diagnosis present

## 2015-11-01 DIAGNOSIS — F1410 Cocaine abuse, uncomplicated: Secondary | ICD-10-CM | POA: Insufficient documentation

## 2015-11-01 DIAGNOSIS — M79609 Pain in unspecified limb: Secondary | ICD-10-CM

## 2015-11-01 DIAGNOSIS — N400 Enlarged prostate without lower urinary tract symptoms: Secondary | ICD-10-CM | POA: Insufficient documentation

## 2015-11-01 DIAGNOSIS — Z79899 Other long term (current) drug therapy: Secondary | ICD-10-CM | POA: Diagnosis not present

## 2015-11-01 DIAGNOSIS — F409 Phobic anxiety disorder, unspecified: Secondary | ICD-10-CM | POA: Insufficient documentation

## 2015-11-01 DIAGNOSIS — F329 Major depressive disorder, single episode, unspecified: Secondary | ICD-10-CM | POA: Diagnosis not present

## 2015-11-01 DIAGNOSIS — F419 Anxiety disorder, unspecified: Secondary | ICD-10-CM | POA: Insufficient documentation

## 2015-11-01 DIAGNOSIS — I10 Essential (primary) hypertension: Secondary | ICD-10-CM | POA: Diagnosis not present

## 2015-11-01 DIAGNOSIS — R200 Anesthesia of skin: Secondary | ICD-10-CM | POA: Diagnosis not present

## 2015-11-01 DIAGNOSIS — M549 Dorsalgia, unspecified: Secondary | ICD-10-CM | POA: Diagnosis not present

## 2015-11-01 DIAGNOSIS — R202 Paresthesia of skin: Secondary | ICD-10-CM | POA: Insufficient documentation

## 2015-11-01 DIAGNOSIS — Z87891 Personal history of nicotine dependence: Secondary | ICD-10-CM | POA: Diagnosis not present

## 2015-11-01 DIAGNOSIS — G8929 Other chronic pain: Secondary | ICD-10-CM | POA: Diagnosis not present

## 2015-11-01 DIAGNOSIS — Z7982 Long term (current) use of aspirin: Secondary | ICD-10-CM | POA: Insufficient documentation

## 2015-11-01 NOTE — ED Notes (Addendum)
Patient left at this time with all belongings. Provided sandwich, pt left resource guide for substance abuse in room.

## 2015-11-01 NOTE — ED Notes (Signed)
Pt st's he has pain in left lower leg with numbness in left knee cap x's 12 days.  Pt st's he has been doing cocaine for past 12 days.  Last used earlier today.  Pt denies any chest pain

## 2015-11-01 NOTE — ED Provider Notes (Signed)
CSN: [id number]     Arrival date & time 11/01/15  2025 History  By signing my name below, I, [name], attest that this documentation has been prepared under the direction and in the presence of non-physician practitioner, Wynetta Emery, PA-C. Electronically Signed: Freida French, Scribe. 11/01/2015. 10:12 PM.    Chief Complaint  Patient presents with  . Leg Pain    The history is provided by the patient. No language interpreter was used.     HPI Comments:  [name] is a 40 y.o. male who presents to the Emergency Department complaining of numbness to his LLE from the knee down x ~ 1 week. He describes a pins and needles sensation in the extremity and mild associated pain to the back of his left leg. He also notes "minute" back pain. No alleviating factors noted. He denies bowel/bladder incontinence and weakness in his extremities. Pt admits to recent cocaine use. He denies CP.   Past Medical History  Diagnosis Date  . BPH (benign prostatic hyperplasia)   . Anxiety   . Depression   . ADHD (attention deficit hyperactivity disorder)   . Abdominal pain, recurrent     chronic lower abdominal pain  . Narcotic abuse     opiates  . Chronic pain   . Hypertension    Past Surgical History  Procedure Laterality Date  . Appendectomy  08/2005   Family History  Problem Relation Age of Onset  . Hypertension Father   . Prostate cancer Other    Social History  Substance Use Topics  . Smoking status: Former Smoker -- 0.50 packs/day    Types: Cigarettes    Quit date: 03/10/2014  . Smokeless tobacco: Never Used  . Alcohol Use: No     Comment: occasion    Review of Systems  10 systems reviewed and all are negative for acute change except as noted in the HPI.  Allergies  Escitalopram oxalate and Contrast media  Home Medications   Prior to Admission medications   Medication Sig Start Date End Date Taking? Authorizing Provider  aspirin EC 81 MG tablet Take 162 mg by mouth  at bedtime.    Historical Provider, MD  cyclobenzaprine (FLEXERIL) 5 MG tablet Take 1 tablet (5 mg total) by mouth 3 (three) times daily as needed for muscle spasms. 08/17/15   Linna Hoff, MD  FLUoxetine (PROZAC) 40 MG capsule Take 40 mg by mouth daily.    Historical Provider, MD  methylphenidate (RITALIN) 10 MG tablet Take 10 mg by mouth 2 (two) times daily.     Historical Provider, MD  naproxen sodium (ANAPROX) 220 MG tablet Take 440 mg by mouth daily as needed (headaches).    Historical Provider, MD  tamsulosin (FLOMAX) 0.4 MG CAPS capsule Take 0.4 mg by mouth daily.    Historical Provider, MD  ZUBSOLV 5.7-1.4 MG SUBL Place 1.5-3 tablets under the tongue daily.  08/10/15   Historical Provider, MD   BP 117/77 mmHg  Pulse 93  Temp(Src) 98.3 F (36.8 C) (Oral)  Resp 18  SpO2 100% Physical Exam  Constitutional: He is oriented to person, place, and time. He appears well-developed and well-nourished. No distress.  HENT:  Head: Normocephalic and atraumatic.  Mouth/Throat: Oropharynx is clear and moist.  Eyes: Conjunctivae and EOM are normal. Pupils are equal, round, and reactive to light.  Neck: Normal range of motion.  Cardiovascular: Normal rate, regular rhythm and intact distal pulses.   Pulmonary/Chest: Effort normal and breath sounds normal.  Abdominal: Soft. He exhibits no distension. There is no tenderness.  Musculoskeletal: Normal range of motion.  Neurological: He is alert and oriented to person, place, and time. No cranial nerve deficit.  Left knee with no deformity, full active range of motion, DP and PT pulses are 2+, excellent range of motion to toes and ankle, patient can differentiate between pinprick and light touch distally.  Skin: Skin is warm and dry. He is not diaphoretic.  Psychiatric: He has a normal mood and affect.  Nursing note and vitals reviewed.   ED Course  Procedures   DIAGNOSTIC STUDIES:  Oxygen Saturation is 100% on RA, normal by my interpretation.     COORDINATION OF CARE:  10:04 PM Pt declined cocaine detox. Discussed treatment plan with pt at bedside and pt agreed to plan.   MDM   Final diagnoses:  Paresthesia and pain of left extremity  Cocaine abuse    Filed Vitals:   11/01/15 2029 11/01/15 2223  BP: 117/77 116/67  Pulse: 93 79  Temp: 98.3 F (36.8 C)   TempSrc: Oral   Resp: 18 16  SpO2: 100% 100%    [name] is 40 y.o. male presenting with Paresthesia to left knee onset 12 days ago when patient started doing a large amount of cocaine. Neuro vastly intact with strong DP and PT pulses, he is able to differentiate between pinprick and light touch, Refill is brisk, the leg is warm. Neuro exam is otherwise nonfocal and patient is ambulatory without complication. Counseled patient on substance abuse, he declines referral. Advised NSAIDs for comfort.  Evaluation does not show pathology that would require ongoing emergent intervention or inpatient treatment. Pt is hemodynamically stable and mentating appropriately. Discussed findings and plan with patient/guardian, who agrees with care plan. All questions answered. Return precautions discussed and outpatient follow up given.   I personally performed the services described in this documentation, which was scribed in my presence. The recorded information has been reviewed and is accurate.    Wynetta Emeryicole Jannell Franta, PA-C 11/02/15 0030  Gerhard Munchobert Lockwood, MD 11/02/15 2128

## 2015-11-01 NOTE — Discharge Instructions (Signed)
Do not hesitate to return to the emergency room for any new, worsening or concerning symptoms.  Please obtain primary care using resource guide below. Let them know that you were seen in the emergency room and that they will need to obtain records for further outpatient management.   Paresthesia Paresthesia is an abnormal burning or prickling sensation. This sensation is generally felt in the hands, arms, legs, or feet. However, it may occur in any part of the body. Usually, it is not painful. The feeling may be described as:  Tingling or numbness.  Pins and needles.  Skin crawling.  Buzzing.  Limbs falling asleep.  Itching. Most people experience temporary (transient) paresthesia at some time in their lives. Paresthesia may occur when you breathe too quickly (hyperventilation). It can also occur without any apparent cause. Commonly, paresthesia occurs when pressure is placed on a nerve. The sensation quickly goes away after the pressure is removed. For some people, however, paresthesia is a long-lasting (chronic) condition that is caused by an underlying disorder. If you continue to have paresthesia, you may need further medical evaluation. HOME CARE INSTRUCTIONS Watch your condition for any changes. Taking the following actions may help to lessen any discomfort that you are feeling:  Avoid drinking alcohol.  Try acupuncture or massage to help relieve your symptoms.  Keep all follow-up visits as directed by your health care provider. This is important. SEEK MEDICAL CARE IF:  You continue to have episodes of paresthesia.  Your burning or prickling feeling gets worse when you walk.  You have pain, cramps, or dizziness.  You develop a rash. SEEK IMMEDIATE MEDICAL CARE IF:  You feel weak.  You have trouble walking or moving.  You have problems with speech, understanding, or vision.  You feel confused.  You cannot control your bladder or bowel movements.  You have  numbness after an injury.  You faint.   This information is not intended to replace advice given to you by your health care provider. Make sure you discuss any questions you have with your health care provider.   Document Released: 06/06/2002 Document Revised: 10/31/2014 Document Reviewed: 06/12/2014 Elsevier Interactive Patient Education 2016 [employer].    [employer] Guide Outpatient Counseling/Substance Abuse Adult The [employer] 211 is a great source of information about community services available.  Access by dialing 2-1-1 from anywhere in [place], or by website -  PooledIncome.pl.   Other Local Resources (Updated 07/2015)  Crisis Hotlines   Services     Area Served  [employer]  Crisis Hotline, available 24 hours a day, 7 days a week: 5644183953 [facility], [place]   [facility]  Crisis Hotline, available 24 hours a day, 7 days a week: [phone] [facility] [place], [place]  [facility]  Suicide Prevention Hotline, available 24 hours a day, 7 days a week: 660-333-8777 [facility], [place]  [employer], available 24 hours a day, 7 days a week: [phone] [facility], [place]   [facility] Access to [employer], available 24 hours a day, 7 days a week: [phone] All   [facility]  Crisis Hotline, available 24 hours a day, 7 days a week: 224-171-5164 All   Other Local Resources (Updated 07/2015)  Outpatient Counseling/ Substance Abuse Programs  Services     Address and Phone Number  ADS ([facility]cohol and Drug Services)   Options include Individual counseling, group counseling, intensive outpatient program (several hours a day, several days a  week)  Offers depression assessments  Provides methadone maintenance program [phone] [address] [place], [place] [address]   [facility]   Offers partial hospitalization/day treatment and  DUI/DWI programs  [employer], private insurance [phone] [address], [address] [place], [place] [address]  [facility]    Services include intensive outpatient program (several hours a day, several days a week), outpatient treatment, DUI/DWI services, family education  Also has some services specifically for [employer] transitional housing  [phone] [address] [place], [place] [address]     [facility]  [employer], private pay, and private insurance [phone] [address] [place], [place] [address]  [employer] [facility]  Services include individual counseling, substance abuse intensive outpatient program (several hours a day, several days a week), day treatment  Delene Loll, Medicaid, private insurance [phone] [address], [address] [place], [place] [address]  [facility]   Offers substance abuse intensive outpatient program (several hours a day, several days a week), partial hospitalization program [phone] [address] [place], [place] [address]  [phone] [address] [place], [place] [address]  [phone] [address] [place], [place] [address]  [phone], [address] [place], [place] [address]  [facility]  Individual counseling only  Accepts private insurance only [phone] [address], [address] [place], [place] [address]  [facility]  Methadone maintenance program [phone] [address] [place], [place] [address]  [facility]  Walk-In Clinic providing substance abuse and mental health counseling  Accepts Medicaid, Medicare, private insurance  Offers sliding scale for uninsured [phone] [address] [place], [place]   [facility], [employer].  Offers individual counseling, and intensive in-home services [phone] [address], [address] [place], [place] [address]  [facility] individual counseling, family counseling, group therapy, domestic violence counseling, consumer credit counseling  Accepts Medicare, Medicaid, private insurance  Offers sliding scale for uninsured [phone] [address] [place], [place] [address]  [phone] [facility], [address] [place], [place] [address]  [facility]  Offers individual, family and group counseling  3 locations - [place], [place], and [place]  [phone]  [address] [place], [place] [address]  [address] [place], [place] [address]  [address] [place], [place] [address]  [facility]    Offers psychiatric assessment, 8-week Intensive Outpatient Program (several hours a day, several times a week, daytime or evenings), early recovery group, family Program, medication management  Private pay or private insurance only [phone], or  [phone] [address] [place], [place] [address]  [facility] [employer] individual, couples and family counseling  Accepts Medicaid, private insurance, and sliding scale for uninsured [phone] [address] [place], [place] [address]  Len Blalock, MD  Individual counseling  Private insurance [phone] [address] [place], [place] [address]  [facility]   Offers assessment, substance abuse treatment, and behavioral health treatment [phone] [address] [place], [place] [address]  [facility]  Individual counseling  Accepts private insurance [phone] [address] [place], [place] [address]  [facility]  Individual counseling  Delene Loll, private insurance [phone] [address] [place], [place] [address]  [facility]    Offers intensive outpatient program (several hours a day, several times a week)  Private pay,  private insurance  [phone] [facility] [address] [facility] [place], [place]  [facility]  Individual counseling  Medicare, private insurance [phone] [address], [address] [place], [place] [address]  [facility]    Offers intensive outpatient program (several hours a day, several times a week) and partial hospitalization program [phone] [address] [place], [place] [address]  Emerson MonteParrish McKinney, MD  Individual counseling [phone] [address], [address] [place], [place] [address]  [facility]  Offers Christian counseling to individuals, couples, and families  Accepts Medicare and private insurance; offers sliding scale for uninsured [phone] [address] [place], [place] [address]  [facility] [phone] [address], [address] [place], [place] [address]  [facility]   Offers crisis counseling, individual counseling, group therapy, in-home therapy, domestic violence services, day treatment, DWI services, Administrator, artsCommunity Support Team (CST), Doctor, hospitalAssertive Community Treatment Team (ACTT), substance abuse Intensive Outpatient Program (several hours a day, several times a week)  2 locations - [place] and [place] [phone] [address] [place], [place] [address]  [phone] [address] [place], [place] [address]  [facility]     Individual counseling and group therapy  Accepts private insurance, [place]are, [place] [phone] [address] [place], [place]  [facility]  Offers individual and family counseling  Offers LGBTQ services  Accepts private insurance and private pay [phone] [address] [place], [place] [address]  [facility]    Offers individual counseling, group therapy, and outpatient detox  Accepts private insurance [phone] [address] [place], [place]   [facility]  Individual counseling  Accepts Medicare, private insurance [phone] [address], [address] [place], [place] [address]  [facility]  Individual counseling  Accepts Medicare, private insurance [phone] [address] [place], [place] [address]  [facility]   Offers substance abuse Intensive Outpatient Program (several hours a day, several times a week) [phone], or [phone] [place], [place]

## 2015-11-03 ENCOUNTER — Encounter (HOSPITAL_COMMUNITY): Payer: Self-pay | Admitting: *Deleted

## 2015-11-03 ENCOUNTER — Encounter (HOSPITAL_COMMUNITY): Payer: Self-pay | Admitting: Emergency Medicine

## 2015-11-03 ENCOUNTER — Inpatient Hospital Stay (HOSPITAL_COMMUNITY)
Admission: RE | Admit: 2015-11-03 | Discharge: 2015-11-10 | DRG: 885 | Disposition: A | Discharge status: Home / Self Care | Payer: Medicaid Other | Attending: Psychiatry | Admitting: Psychiatry

## 2015-11-03 ENCOUNTER — Emergency Department (HOSPITAL_COMMUNITY)
Admission: EM | Admit: 2015-11-03 | Discharge: 2015-11-03 | Payer: Medicaid Other | Attending: Emergency Medicine | Admitting: Emergency Medicine

## 2015-11-03 DIAGNOSIS — G8929 Other chronic pain: Secondary | ICD-10-CM | POA: Insufficient documentation

## 2015-11-03 DIAGNOSIS — F19239 Other psychoactive substance dependence with withdrawal, unspecified: Secondary | ICD-10-CM | POA: Clinically undetermined

## 2015-11-03 DIAGNOSIS — Z79891 Long term (current) use of opiate analgesic: Secondary | ICD-10-CM

## 2015-11-03 DIAGNOSIS — F1210 Cannabis abuse, uncomplicated: Secondary | ICD-10-CM | POA: Insufficient documentation

## 2015-11-03 DIAGNOSIS — Z79899 Other long term (current) drug therapy: Secondary | ICD-10-CM | POA: Diagnosis not present

## 2015-11-03 DIAGNOSIS — F919 Conduct disorder, unspecified: Secondary | ICD-10-CM | POA: Insufficient documentation

## 2015-11-03 DIAGNOSIS — R45851 Suicidal ideations: Secondary | ICD-10-CM

## 2015-11-03 DIAGNOSIS — F19950 Other psychoactive substance use, unspecified with psychoactive substance-induced psychotic disorder with delusions: Secondary | ICD-10-CM | POA: Clinically undetermined

## 2015-11-03 DIAGNOSIS — Z87891 Personal history of nicotine dependence: Secondary | ICD-10-CM | POA: Diagnosis not present

## 2015-11-03 DIAGNOSIS — F3289 Other specified depressive episodes: Secondary | ICD-10-CM

## 2015-11-03 DIAGNOSIS — Z7982 Long term (current) use of aspirin: Secondary | ICD-10-CM | POA: Insufficient documentation

## 2015-11-03 DIAGNOSIS — R440 Auditory hallucinations: Secondary | ICD-10-CM | POA: Diagnosis not present

## 2015-11-03 DIAGNOSIS — Z87438 Personal history of other diseases of male genital organs: Secondary | ICD-10-CM | POA: Insufficient documentation

## 2015-11-03 DIAGNOSIS — F909 Attention-deficit hyperactivity disorder, unspecified type: Secondary | ICD-10-CM | POA: Diagnosis present

## 2015-11-03 DIAGNOSIS — F1510 Other stimulant abuse, uncomplicated: Secondary | ICD-10-CM | POA: Diagnosis present

## 2015-11-03 DIAGNOSIS — F1924 Other psychoactive substance dependence with psychoactive substance-induced mood disorder: Secondary | ICD-10-CM

## 2015-11-03 DIAGNOSIS — F1190 Opioid use, unspecified, uncomplicated: Secondary | ICD-10-CM | POA: Diagnosis not present

## 2015-11-03 DIAGNOSIS — R443 Hallucinations, unspecified: Secondary | ICD-10-CM

## 2015-11-03 DIAGNOSIS — F1110 Opioid abuse, uncomplicated: Secondary | ICD-10-CM | POA: Diagnosis not present

## 2015-11-03 DIAGNOSIS — F1424 Cocaine dependence with cocaine-induced mood disorder: Secondary | ICD-10-CM | POA: Diagnosis not present

## 2015-11-03 DIAGNOSIS — Z008 Encounter for other general examination: Secondary | ICD-10-CM | POA: Diagnosis present

## 2015-11-03 DIAGNOSIS — Z9119 Patient's noncompliance with other medical treatment and regimen: Secondary | ICD-10-CM | POA: Diagnosis not present

## 2015-11-03 DIAGNOSIS — F329 Major depressive disorder, single episode, unspecified: Secondary | ICD-10-CM | POA: Diagnosis present

## 2015-11-03 DIAGNOSIS — F1121 Opioid dependence, in remission: Secondary | ICD-10-CM | POA: Clinically undetermined

## 2015-11-03 DIAGNOSIS — F333 Major depressive disorder, recurrent, severe with psychotic symptoms: Principal | ICD-10-CM | POA: Diagnosis present

## 2015-11-03 DIAGNOSIS — F419 Anxiety disorder, unspecified: Secondary | ICD-10-CM | POA: Diagnosis not present

## 2015-11-03 DIAGNOSIS — F1410 Cocaine abuse, uncomplicated: Secondary | ICD-10-CM | POA: Insufficient documentation

## 2015-11-03 DIAGNOSIS — F1220 Cannabis dependence, uncomplicated: Secondary | ICD-10-CM | POA: Diagnosis present

## 2015-11-03 DIAGNOSIS — F1420 Cocaine dependence, uncomplicated: Secondary | ICD-10-CM | POA: Diagnosis present

## 2015-11-03 DIAGNOSIS — F900 Attention-deficit hyperactivity disorder, predominantly inattentive type: Secondary | ICD-10-CM | POA: Diagnosis not present

## 2015-11-03 DIAGNOSIS — F19922 Other psychoactive substance use, unspecified with intoxication with perceptual disturbance: Secondary | ICD-10-CM | POA: Clinically undetermined

## 2015-11-03 DIAGNOSIS — I10 Essential (primary) hypertension: Secondary | ICD-10-CM | POA: Insufficient documentation

## 2015-11-03 DIAGNOSIS — F32A Depression, unspecified: Secondary | ICD-10-CM | POA: Diagnosis present

## 2015-11-03 DIAGNOSIS — F1910 Other psychoactive substance abuse, uncomplicated: Secondary | ICD-10-CM

## 2015-11-03 LAB — RAPID URINE DRUG SCREEN, HOSP PERFORMED
AMPHETAMINES: NOT DETECTED
BARBITURATES: NOT DETECTED
BENZODIAZEPINES: NOT DETECTED
Cocaine: POSITIVE — AB
Opiates: NOT DETECTED
Tetrahydrocannabinol: POSITIVE — AB

## 2015-11-03 LAB — CBC WITH DIFFERENTIAL/PLATELET
BASOS ABS: 0 10*3/uL (ref 0.0–0.1)
BASOS PCT: 0 %
Eosinophils Absolute: 0.6 10*3/uL (ref 0.0–0.7)
Eosinophils Relative: 6 %
HEMATOCRIT: 44.2 % (ref 39.0–52.0)
HEMOGLOBIN: 14.6 g/dL (ref 13.0–17.0)
Lymphocytes Relative: 26 %
Lymphs Abs: 2.4 10*3/uL (ref 0.7–4.0)
MCH: 31.1 pg (ref 26.0–34.0)
MCHC: 33 g/dL (ref 30.0–36.0)
MCV: 94 fL (ref 78.0–100.0)
Monocytes Absolute: 0.8 10*3/uL (ref 0.1–1.0)
Monocytes Relative: 9 %
NEUTROS ABS: 5.3 10*3/uL (ref 1.7–7.7)
NEUTROS PCT: 59 %
Platelets: 290 10*3/uL (ref 150–400)
RBC: 4.7 MIL/uL (ref 4.22–5.81)
RDW: 13.4 % (ref 11.5–15.5)
WBC: 9.1 10*3/uL (ref 4.0–10.5)

## 2015-11-03 LAB — COMPREHENSIVE METABOLIC PANEL
ALBUMIN: 4.2 g/dL (ref 3.5–5.0)
ALK PHOS: 44 U/L (ref 38–126)
ALT: 17 U/L (ref 17–63)
AST: 25 U/L (ref 15–41)
Anion gap: 8 (ref 5–15)
BILIRUBIN TOTAL: 0.6 mg/dL (ref 0.3–1.2)
BUN: 10 mg/dL (ref 6–20)
CALCIUM: 8.7 mg/dL — AB (ref 8.9–10.3)
CO2: 29 mmol/L (ref 22–32)
CREATININE: 1.02 mg/dL (ref 0.61–1.24)
Chloride: 102 mmol/L (ref 101–111)
GFR calc non Af Amer: >60 mL/min (ref >60)
Glucose, Bld: 84 mg/dL (ref 65–99)
Potassium: 3.5 mmol/L (ref 3.5–5.1)
Sodium: 139 mmol/L (ref 135–145)
Total Protein: 6.2 g/dL — ABNORMAL LOW (ref 6.5–8.1)

## 2015-11-03 LAB — ETHANOL: Alcohol, Ethyl (B): <5 mg/dL (ref <5)

## 2015-11-03 LAB — ACETAMINOPHEN LEVEL: Acetaminophen (Tylenol), Serum: <10 ug/mL — ABNORMAL LOW (ref 10–30)

## 2015-11-03 LAB — SALICYLATE LEVEL

## 2015-11-03 MED ORDER — OXYCODONE HCL 5 MG PO TABS
10.0000 mg | ORAL_TABLET | Freq: Once | ORAL | Status: AC
  Administered 2015-11-03: 10 mg via ORAL
  Filled 2015-11-03: qty 2

## 2015-11-03 MED ORDER — ONDANSETRON 8 MG PO TBDP
8.0000 mg | ORAL_TABLET | Freq: Once | ORAL | Status: AC
  Administered 2015-11-03: 8 mg via ORAL
  Filled 2015-11-03: qty 1

## 2015-11-03 NOTE — ED Notes (Signed)
[facility] R[facility]ensJoanne[facility] patient [facility] bed on the [facility] hall pending medical clearance.

## 2015-11-03 NOTE — Tx Team (Signed)
Initial Interdisciplinary Treatment Plan   PATIENT STRESSORS: Medication change or noncompliance Substance abuse   PATIENT STRENGTHS: Average or above average intelligence Capable of independent living Motivation for treatment/growth   PROBLEM LIST: Problem List/Patient Goals Date to be addressed Date deferred Reason deferred Estimated date of resolution  Substance abuse 11/03/15     Suicidal Ideation 11/03/15     Depression 11/03/15     "get medication right, learn how to live without drugs"                                     DISCHARGE CRITERIA:  Improved stabilization in mood, thinking, and/or behavior Motivation to continue treatment in a less acute level of care Need for constant or close observation no longer present Verbal commitment to aftercare and medication compliance Withdrawal symptoms are absent or subacute and managed without 24-hour nursing intervention  PRELIMINARY DISCHARGE PLAN: Attend 12-step recovery group Return to previous living arrangement  PATIENT/FAMIILY INVOLVEMENT: This treatment plan has been presented to and reviewed with the patient, [name].  The patient and family have been given the opportunity to ask questions and make suggestions.  [name], [name] 11/03/2015, 11:46 PM

## 2015-11-03 NOTE — BH Assessment (Addendum)
Assessment Note  [name] is an 40 y.o. male. Presenting voluntarily for assessments with c/o paranoia, AH w/command, SA and emotional distress. Pt reports h/o anxiety w/ panic attacks, depression and ADD. Pt reports panic attacks have recently increased in frequency and now occur multiple times a week. Pt reports experiencing 2 attacks on yesterday (5.5.17). Pt reports that he is followed by Carlyon Shadow (opt). Pt reports non-compliance with prescribed Neurontin. Pt reports h/o opiate use and that he currently receives Suboxone from [facility]. Pt reports daily use of Cannabis and Cocaine for the past two weeks. Pt reports that he has not slept or eaten x4days.   Diagnosis: F33.3 Major depressive d/o, recurrent with psychotic features F41.21 Generalized anxiety d/o w/ panic attacks (per report) F90.0 Attention-deficit/hyperactivity d/o, predominantly inattentive presentation (per report)   Past Medical History:  Past Medical History  Diagnosis Date  . BPH (benign prostatic hyperplasia)   . Anxiety   . Depression   . ADHD (attention deficit hyperactivity disorder)   . Abdominal pain, recurrent     chronic lower abdominal pain  . Narcotic abuse     opiates  . Chronic pain   . Hypertension     Past Surgical History  Procedure Laterality Date  . Appendectomy  08/2005    Family History:  Family History  Problem Relation Age of Onset  . Hypertension Father   . Prostate cancer Other     Social History:  reports that he quit smoking about 19 months ago. His smoking use included Cigarettes. He smoked 0.50 packs per day. He has never used smokeless tobacco. He reports that he uses illicit drugs (Oxycodone, Opium, and Cocaine). He reports that he does not drink alcohol.  Additional Social History:  Alcohol / Drug Use Pain Medications: None Reported Prescriptions: Pt reports non-compliance with Ritalin, Prozac & Neurotin- pt reports he last received Suboxone from [facility] on 5.5.17 Over the Counter: None Reported History of alcohol / drug use?: Yes Longest period of sobriety (when/how long): 5-6 months Negative Consequences of Use: Personal relationships Withdrawal Symptoms: Nausea / Vomiting Substance #1 Name of Substance 1: Cannabis 1 - Age of First Use: 16 1 - Amount (size/oz): "as much as I can get" 1 - Frequency: daily for the past 2 weeks 1 - Duration: ongoing 1 - Last Use / Amount: Not Reported Substance #2 Name of Substance 2: Opiates 2 - Age of First Use: 29 2 - Amount (size/oz): "as much as I can get" 2 - Frequency: daily 2 - Duration: Ongoing 2 - Last Use / Amount: 1 pill yesterday, prior to that no use for 5-6 months Substance #3 Name of Substance 3: Cocaine 3 - Age of First Use: 21 3 - Amount (size/oz): $100 worth 3 - Frequency: daily 3 - Duration: daily for the past 2 weeks 3 - Last Use / Amount: Not Reported  CIWA:   COWS:    Allergies:  Allergies  Allergen Reactions  . Escitalopram Oxalate Hives  . Contrast Media [Iodinated Diagnostic Agents] Nausea And Vomiting    Home Medications:  (Not in a hospital admission)  OB/GYN Status:  No LMP for male patient.  General Assessment Data Location of Assessment: [facility] Assessment Services TTS Assessment: In system Is this a Tele or Face-to-Face Assessment?: Face-to-Face Is this an Initial Assessment or a Re-assessment for this encounter?: Initial Assessment Marital status: Single Is patient pregnant?: No Pregnancy Status: No Living Arrangements: Alone Can pt return to current living arrangement?: Yes  Admission Status: Voluntary Is patient capable of signing voluntary admission?: Yes Referral Source: Self/Family/Friend Insurance type: Medicaid  Medical Screening Exam [facility] Walk-in ONLY) Medical Exam completed: No Reason for MSE not completed: Other: (Pt transported to [facility] for medical clearance)  Crisis Care Plan Living Arrangements: Alone Name of Psychiatrist:  Bill Salinas Name of Therapist: Carlyon Shadow  Education Status Is patient currently in school?: No Highest grade of school patient has completed: 12th  Risk to self with the past 6 months Suicidal Ideation: Yes-Currently Present Has patient been a risk to self within the past 6 months prior to admission? : No Suicidal Intent: No Has patient had any suicidal intent within the past 6 months prior to admission? : No Is patient at risk for suicide?: Yes Suicidal Plan?: Yes-Currently Present Has patient had any suicidal plan within the past 6 months prior to admission? : No Specify Current Suicidal Plan: Pill OD Access to Means: Yes Specify Access to Suicidal Means: Access to pills What has been your use of drugs/alcohol within the last 12 months?: Pt reorts use of THC, Opiates, and Cocaine Previous Attempts/Gestures: No Other Self Harm Risks: SA, non-compliance with medications, psychosis Intentional Self Injurious Behavior: None Family Suicide History: No Recent stressful life event(s): Other (Comment) (Mental Health) Persecutory voices/beliefs?: No Depression: Yes Depression Symptoms: Tearfulness, Insomnia, Isolating, Guilt, Loss of interest in usual pleasures, Feeling worthless/self pity, Feeling angry/irritable Substance abuse history and/or treatment for substance abuse?: Yes Suicide prevention information given to non-admitted patients: Not applicable  Risk to Others within the past 6 months Homicidal Ideation: No Does patient have any lifetime risk of violence toward others beyond the six months prior to admission? : No Thoughts of Harm to Others: No Current Homicidal Intent: No Current Homicidal Plan: No Access to Homicidal Means: No History of harm to others?: No Assessment of Violence: None Noted Does patient have access to weapons?: No Criminal Charges Pending?: No Does patient have a court date: No Is patient on probation?: No  Psychosis Hallucinations: Auditory,  With command Delusions: None noted  Mental Status Report Appearance/Hygiene: Unremarkable Eye Contact: Fair Motor Activity: Unremarkable Speech: Logical/coherent Level of Consciousness: Alert Mood: Depressed, Anxious Affect: Appropriate to circumstance Anxiety Level: Minimal Thought Processes: Coherent, Relevant Judgement: Unimpaired Orientation: Person, Place, Time, Situation, Appropriate for developmental age Obsessive Compulsive Thoughts/Behaviors: None  Cognitive Functioning Concentration: Normal Memory: Recent Intact, Remote Intact IQ: Average Insight: Good Impulse Control: Fair Appetite: Poor (pt reports not eating x4 days) Weight Loss:  ("I'm sure I have lost some, I don't know how much") Weight Gain: 0 Sleep: Decreased (Has not slept x4 days) Total Hours of Sleep: 5 (Typically ) Vegetative Symptoms: Decreased grooming  ADLScreening [facility] Assessment Services) Patient's cognitive ability adequate to safely complete daily activities?: Yes Patient able to express need for assistance with ADLs?: Yes Independently performs ADLs?: Yes (appropriate for developmental age)  Prior Inpatient Therapy Prior Inpatient Therapy: Yes Prior Therapy Dates: Multiple, last admission 2009 Prior Therapy Facilty/Provider(s): [facility] Reason for Treatment: "I can't remember, probably the same thing"  Prior Outpatient Therapy Prior Outpatient Therapy: Yes Prior Therapy Dates: Ongoing Prior Therapy Facilty/Provider(s): [facility] Reason for Treatment: Depression, Anxiety Does patient have an ACCT team?: No Does patient have Intensive In-House Services?  : No Does patient have [facility] services? : No Does patient have P4CC services?: No  ADL Screening (condition at time of admission) Patient's cognitive ability adequate to safely complete daily activities?: Yes Is the patient deaf or have difficulty hearing?: No  Does the patient have difficulty seeing, even when wearing glasses/contacts?:  No Does the patient have difficulty concentrating, remembering, or making decisions?: Yes Patient able to express need for assistance with ADLs?: Yes Does the patient have difficulty dressing or bathing?: No Independently performs ADLs?: Yes (appropriate for developmental age) Does the patient have difficulty walking or climbing stairs?: No Weakness of Legs: None Weakness of Arms/Hands: None  Home Assistive Devices/Equipment Home Assistive Devices/Equipment: None  Therapy Consults (therapy consults require a physician order) PT Evaluation Needed: No OT Evalulation Needed: No SLP Evaluation Needed: No Abuse/Neglect Assessment (Assessment to be complete while patient is alone) Physical Abuse: Denies Verbal Abuse: Denies Sexual Abuse: Denies Exploitation of patient/patient's resources: Denies Self-Neglect: Denies Values / Beliefs Cultural Requests During Hospitalization: None Spiritual Requests During Hospitalization: None Consults Spiritual Care Consult Needed: No Social Work Consult Needed: No      Additional Information 1:1 In Past 12 Months?: No CIRT Risk: No Elopement Risk: No Does patient have medical clearance?: No     Disposition: Pt meets criteria for inpatient admission per Dr.Lugo. Pt has been assigned to 504 bed 1 by Rutha BouchardJoAnn, AC. Disposition Initial Assessment Completed for this Encounter: Yes Disposition of Patient: Inpatient treatment program Type of inpatient treatment program: Adult  On Site Evaluation by:   Reviewed with Physician:    Brance Dartt J [place] 11/03/2015 8:28 PM`

## 2015-11-03 NOTE — ED Provider Notes (Signed)
CSN: [id number]     Arrival date & time 11/03/15  2015 History   First MD Initiated Contact with Patient 11/03/15 2059     Chief Complaint  Patient presents with  . Medical Clearance     (Consider location/radiation/quality/duration/timing/severity/associated sxs/prior Treatment) HPI Comments: 40 year old male since the emergency department for medical clearance. He previously presented to behavioral health. He is pending observation at behavioral health tonight after he is medically cleared. He reports abusing THC and cocaine. He is seeking treatment for substance abuse, but states that he is on Suboxone daily. He has not had this medication in over 24 hours. He reports auditory hallucinations telling him to do drugs. He denies any visual hallucinations. No suicidal or homicidal ideations expressed. No other complaints for this visit.  The history is provided by the patient. No language interpreter was used.    Past Medical History  Diagnosis Date  . BPH (benign prostatic hyperplasia)   . Anxiety   . Depression   . ADHD (attention deficit hyperactivity disorder)   . Abdominal pain, recurrent     chronic lower abdominal pain  . Narcotic abuse     opiates  . Chronic pain   . Hypertension    Past Surgical History  Procedure Laterality Date  . Appendectomy  08/2005   Family History  Problem Relation Age of Onset  . Hypertension Father   . Prostate cancer Other    Social History  Substance Use Topics  . Smoking status: Former Smoker -- 0.50 packs/day    Types: Cigarettes    Quit date: 03/10/2014  . Smokeless tobacco: Never Used  . Alcohol Use: No     Comment: occasion    Review of Systems  Gastrointestinal: Positive for nausea.  Psychiatric/Behavioral: Positive for behavioral problems. Negative for suicidal ideas.  All other systems reviewed and are negative.   Allergies  Escitalopram oxalate and Contrast media  Home Medications   Prior to Admission medications    Medication Sig Start Date End Date Taking? Authorizing Provider  aspirin EC 81 MG tablet Take 81 mg by mouth at bedtime.    Yes Historical Provider, MD  FLUoxetine (PROZAC) 40 MG capsule Take 40 mg by mouth daily.   Yes Historical Provider, MD  gabapentin (NEURONTIN) 300 MG capsule Take 300 mg by mouth 3 (three) times daily.   Yes Historical Provider, MD  methylphenidate (RITALIN) 20 MG tablet Take 20 mg by mouth 2 (two) times daily.   Yes Historical Provider, MD  Multiple Vitamin (MULTIVITAMIN) tablet Take 1 tablet by mouth daily.   Yes Historical Provider, MD  SUBOXONE 8-2 MG FILM Take 1 Film by mouth 3 (three) times daily.  10/29/15  Yes Historical Provider, MD  traZODone (DESYREL) 100 MG tablet Take 100 mg by mouth at bedtime as needed for sleep.   Yes Historical Provider, MD  cyclobenzaprine (FLEXERIL) 5 MG tablet Take 1 tablet (5 mg total) by mouth 3 (three) times daily as needed for muscle spasms. Patient not taking: Reported on 11/03/2015 08/17/15   Linna Hoff, MD   BP 109/65 mmHg  Pulse 89  Temp(Src) 97.8 F (36.6 C) (Oral)  Resp 19  Ht  (1.727 m)  Wt 64.864 kg  BMI 21.75 kg/m2  SpO2 99%   Physical Exam  Constitutional: He is oriented to person, place, and time. He appears well-developed and well-nourished. No distress.  HENT:  Head: Normocephalic and atraumatic.  Eyes: Conjunctivae and EOM are normal. No scleral icterus.  Neck:  Normal range of motion.  Pulmonary/Chest: Effort normal. No respiratory distress.  Musculoskeletal: Normal range of motion.  Neurological: He is alert and oriented to person, place, and time. He exhibits normal muscle tone. Coordination normal.  Skin: Skin is warm and dry. No rash noted. He is not diaphoretic. No erythema. No pallor.  Psychiatric: His speech is normal. His mood appears anxious. He is agitated (mild). He expresses no homicidal and no suicidal ideation.  Nursing note and vitals reviewed.   ED Course  Procedures (including  critical care time) Labs Review Labs Reviewed  COMPREHENSIVE METABOLIC PANEL - Abnormal; Notable for the following:    Calcium 8.7 (*)    Total Protein 6.2 (*)    All other components within normal limits  ACETAMINOPHEN LEVEL - Abnormal; Notable for the following:    Acetaminophen (Tylenol), Serum <10 (*)    All other components within normal limits  URINE RAPID DRUG SCREEN, HOSP PERFORMED - Abnormal; Notable for the following:    Cocaine POSITIVE (*)    Tetrahydrocannabinol POSITIVE (*)    All other components within normal limits  CBC WITH DIFFERENTIAL/PLATELET  SALICYLATE LEVEL  ETHANOL    Imaging Review No results found.   I have personally reviewed and evaluated these images and lab results as part of my medical decision-making.   EKG Interpretation None      MDM   Final diagnoses:  Hallucinations  Polysubstance abuse    40 year old male presents to the emergency department for medical clearance. He has been accepted at behavioral health for further psychiatric management. Laboratory workup is noncontributory. [facility].    Filed Vitals:   11/03/15 2037  BP: 109/65  Pulse: 89  Temp: 97.8 F (36.6 C)  TempSrc: Oral  Resp: 19  Height: 5\' 8"  (1.727 m)  Weight: 64.864 kg  SpO2: 99%       Antony MaduraKelly Eilleen Davoli, PA-C 11/03/15 2215  Raeford RazorStephen Kohut, MD 11/08/15 1249

## 2015-11-03 NOTE — ED Notes (Signed)
[facility] here to transport pt to [facility]. 

## 2015-11-03 NOTE — ED Notes (Signed)
[facility] He has a bed holding for him. Pt is calm and cooperative at time of assessment. Pt is seeking treatment for substance abuse. Pt abuses THC and cocaine. Pt denies etoh abuse. Pt denies SI/HI. Pt denies VH. Pt does endorse AH of voices telling him to do drugs. Pt states he had done THC and cocaine for 12 days straight

## 2015-11-03 NOTE — Progress Notes (Signed)
Admission note:  Pt is 40 year old male admitted for suicidal ideation, and initially for paranoia and AH hallucinations that Pt states was induced by the cocaine.  Pt has history of panic attacks, ADD, depression.  Pt states panic attacks are occuring several times a week.  Pt has not been compliant with Neurontin, and has a history of opiate abuse for which he takes Suboxone.  Pt states that he has been taking this for the last six months with the last dose on Friday morning.  Pt reports no sleep and not eating for the last four days.  Pt has been using cocaine and THC daily for the past two weeks.  Pt is requesting to be ordered his Suboxone and Ritalin.

## 2015-11-03 NOTE — Progress Notes (Signed)
Review of [facility] reveals pt filled RX for SUBOXONE 8/2 MG STRIPS #84/28 DAY SUPPLY from Dr Tollie EthPlummer ON 10/29/2015.In ED HE CLAIMED TO HAVE HAD NO SUBOXONE IN OVER 24 HRS? Also on 5/4 he filled rx for Ritallin 20 mg #60 from  Dr Mikeal HawthorneGarba .

## 2015-11-04 ENCOUNTER — Encounter (HOSPITAL_COMMUNITY): Payer: Self-pay | Admitting: Psychiatry

## 2015-11-04 DIAGNOSIS — F1220 Cannabis dependence, uncomplicated: Secondary | ICD-10-CM | POA: Diagnosis present

## 2015-11-04 DIAGNOSIS — F1510 Other stimulant abuse, uncomplicated: Secondary | ICD-10-CM | POA: Diagnosis present

## 2015-11-04 DIAGNOSIS — F329 Major depressive disorder, single episode, unspecified: Secondary | ICD-10-CM

## 2015-11-04 DIAGNOSIS — R45851 Suicidal ideations: Secondary | ICD-10-CM

## 2015-11-04 DIAGNOSIS — F1420 Cocaine dependence, uncomplicated: Secondary | ICD-10-CM | POA: Diagnosis present

## 2015-11-04 DIAGNOSIS — Z79891 Long term (current) use of opiate analgesic: Secondary | ICD-10-CM

## 2015-11-04 DIAGNOSIS — F1424 Cocaine dependence with cocaine-induced mood disorder: Secondary | ICD-10-CM

## 2015-11-04 MED ORDER — BUPRENORPHINE HCL 8 MG SL SUBL
8.0000 mg | SUBLINGUAL_TABLET | Freq: Two times a day (BID) | SUBLINGUAL | Status: DC
Start: 2015-11-04 — End: 2015-11-10
  Administered 2015-11-04 – 2015-11-10 (×12): 8 mg via SUBLINGUAL
  Filled 2015-11-04 (×12): qty 1

## 2015-11-04 MED ORDER — NAPROXEN 500 MG PO TABS
500.0000 mg | ORAL_TABLET | Freq: Two times a day (BID) | ORAL | Status: AC | PRN
  Administered 2015-11-08: 500 mg via ORAL
  Filled 2015-11-04: qty 1

## 2015-11-04 MED ORDER — METHOCARBAMOL 500 MG PO TABS: 500.0000 mg | ORAL_TABLET | Freq: Three times a day (TID) | ORAL | Status: AC | PRN

## 2015-11-04 MED ORDER — BUPRENORPHINE HCL 8 MG SL SUBL
8.0000 mg | SUBLINGUAL_TABLET | Freq: Every day | SUBLINGUAL | Status: AC
  Administered 2015-11-04: 8 mg via SUBLINGUAL
  Filled 2015-11-04: qty 1

## 2015-11-04 MED ORDER — TRAZODONE HCL 100 MG PO TABS
100.0000 mg | ORAL_TABLET | Freq: Every evening | ORAL | Status: DC | PRN
  Administered 2015-11-05: 100 mg via ORAL
  Filled 2015-11-04 (×2): qty 1

## 2015-11-04 MED ORDER — ACETAMINOPHEN 325 MG PO TABS
650.0000 mg | ORAL_TABLET | Freq: Four times a day (QID) | ORAL | Status: DC | PRN
  Administered 2015-11-06 – 2015-11-08 (×3): 650 mg via ORAL
  Filled 2015-11-04 (×3): qty 2

## 2015-11-04 MED ORDER — METHYLPHENIDATE HCL 10 MG PO TABS
20.0000 mg | ORAL_TABLET | Freq: Two times a day (BID) | ORAL | Status: DC
  Administered 2015-11-04 – 2015-11-10 (×13): 20 mg via ORAL
  Filled 2015-11-04 (×13): qty 2

## 2015-11-04 MED ORDER — DICYCLOMINE HCL 20 MG PO TABS: 20.0000 mg | ORAL_TABLET | Freq: Four times a day (QID) | ORAL | Status: AC | PRN

## 2015-11-04 MED ORDER — LOPERAMIDE HCL 2 MG PO CAPS: 2.0000 mg | ORAL_CAPSULE | ORAL | Status: AC | PRN

## 2015-11-04 MED ORDER — NICOTINE 21 MG/24HR TD PT24
21.0000 mg | MEDICATED_PATCH | Freq: Every day | TRANSDERMAL | Status: DC
  Administered 2015-11-04 – 2015-11-10 (×7): 21 mg via TRANSDERMAL
  Filled 2015-11-04 (×12): qty 1

## 2015-11-04 MED ORDER — MAGNESIUM HYDROXIDE 400 MG/5ML PO SUSP: 30.0000 mL | Freq: Every day | ORAL | Status: DC | PRN

## 2015-11-04 MED ORDER — HYDROXYZINE HCL 25 MG PO TABS
25.0000 mg | ORAL_TABLET | Freq: Four times a day (QID) | ORAL | Status: AC | PRN
  Administered 2015-11-06 – 2015-11-07 (×2): 25 mg via ORAL
  Filled 2015-11-04 (×2): qty 1

## 2015-11-04 MED ORDER — FLUOXETINE HCL 20 MG PO CAPS
40.0000 mg | ORAL_CAPSULE | Freq: Every day | ORAL | Status: DC
  Administered 2015-11-04 – 2015-11-06 (×3): 40 mg via ORAL
  Filled 2015-11-04 (×6): qty 2

## 2015-11-04 NOTE — [facility] Suicide Risk Assessment (Signed)
[facility] Admission Suicide Risk Assessment   Nursing information obtained from:  Patient Demographic factors:  Male, Caucasian, Unemployed, Living alone Current Mental Status:  Suicidal ideation indicated by patient Loss Factors:  Financial problems / change in socioeconomic status Historical Factors:  Prior suicide attempts, Family history of mental illness or substance abuse Risk Reduction Factors:  Positive therapeutic relationship  Total Time spent with patient: 45 minutes Principal Problem: Depression with suicidal ideation Diagnosis:   Patient Active Problem List   Diagnosis Date Noted  . Cocaine dependence (HCC) [F14.20] 11/04/2015  . Cannabis dependence (HCC) [F12.20] 11/04/2015  . Amphetamine abuse [F15.10] 11/04/2015  . Chronic use of opiate for therapeutic purpose [Z79.899] 11/04/2015  . Depression with suicidal ideation [F32.9, R45.851] 11/04/2015   Subjective Data: 40 Y/O male with increased depression, thoughts of suicide, went off his medications. He relapsed on cocaine. Saw his therapist, he  feels like a failure most of the time, thinks things are not going to get any better. 2 weeks ago woke up and felt very depressed. He relapsed on cocaine. He went to the clinic where he tested positive, they gave him a warning. Concerned that if he does not quit and get himself together he is going to be left without the Suboxone  Admits to depression:  no energy no motivation all he wants to do is sleep. Decreased appetite has lost weight. Not restful sleep. Some SI. Anxiety worry panic attacks. 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 Hard to concentrate. Single no children.  Aunt addiction mental illness Continued Clinical Symptoms:  Alcohol Use Disorder Identification Test Final Score (AUDIT): 0 The "Alcohol Use Disorders  Identification Test", Guidelines for Use in Primary Care, Second Edition.  World Science writer [facility]). Score between 0-7:  no or low risk or alcohol related problems. Score between 8-15:  moderate risk of alcohol related problems. Score between 16-19:  high risk of alcohol related problems. Score 20 or above:  warrants further diagnostic evaluation for alcohol dependence and treatment.   CLINICAL FACTORS:   Depression:   Comorbid alcohol abuse/dependence Alcohol/Substance Abuse/Dependencies   Musculoskeletal: Strength & Muscle Tone: within normal limits Gait & Station: normal Patient leans: normal  Psychiatric Specialty Exam: Review of Systems  Constitutional: Positive for weight loss and malaise/fatigue.  HENT:       Dull behind eyes  Eyes: Negative.   Respiratory:       Pack a day  Cardiovascular: Negative.   Gastrointestinal: Positive for nausea and diarrhea.  Genitourinary: Negative.   Musculoskeletal: Positive for back pain and joint pain.  Skin: Negative.   Neurological: Positive for weakness and headaches.  Endo/Heme/Allergies: Negative.   Psychiatric/Behavioral: Positive for depression, suicidal ideas, hallucinations and substance abuse. The patient is nervous/anxious and has insomnia.     Blood pressure 87/53, pulse 61, temperature 98.5 F (36.9 C), temperature source Oral, resp. rate 16, height  (1.727 m), weight 64.864 kg (143 lb).Body mass index is 21.75 kg/(m^2).  General Appearance: Fairly Groomed  Patent attorney::  Fair  Speech:  Clear and Coherent  Volume:  Decreased  Mood:  Anxious and Depressed  Affect:  Restricted  Thought Process:  Coherent and Goal Directed  Orientation:  Other:  O X 3  Thought Content:  symptoms events worries concerns  Suicidal Thoughts:  Yes.  without intent/plan  Homicidal Thoughts:  No  Memory:  Immediate;   Fair Recent;   Fair Remote;   Fair  Judgement:  Fair  Insight:  Present and Shallow  Psychomotor Activity:   Decreased  Concentration:  Poor  Recall:  Fair  Fund of Knowledge:Fair  Language: Fair  Akathisia:  No  Handed:  Right  AIMS (if indicated):     Assets:  Desire for Improvement Social Support  Sleep:  Number of Hours: 5  Cognition: WNL  ADL's:  Intact    COGNITIVE FEATURES THAT CONTRIBUTE TO RISK:  Closed-mindedness, Polarized thinking and Thought constriction (tunnel vision)    SUICIDE RISK:   Moderate:  Frequent suicidal ideation with limited intensity, and duration, some specificity in terms of plans, no associated intent, good self-control, limited dysphoria/symptomatology, some risk factors present, and identifiable protective factors, including available and accessible social support.  PLAN OF CARE: Supportive approach/coping skills Opioid dependence; he has a current prescription for Suboxone, will renew as there are no plans to wean him off at this stage Depression; continue Prozac 40 mg daily optimize dose response Inattentiveness; resume the Ritalin 20 mg BID has a valid prescription Work with CBT/mindfulness/Problem solving   I certify that inpatient services furnished can reasonably be expected to improve the patient's condition.   Rachael FeeLUGO,Amber Guthridge A, MD 11/04/2015, 1:00 PM

## 2015-11-04 NOTE — H&P (Signed)
Psychiatric Admission Assessment Adult  Patient Identification: [name] MRN:  [id number] Date of Evaluation:  11/04/2015 Chief Complaint:  mdd,rec,sev with psych features generalized anxiety with panic attacks Principal Diagnosis: Depression with suicidal ideation Diagnosis:   Patient Active Problem List   Diagnosis Date Noted  . Cocaine dependence ([place]) [F14.20] 11/04/2015  . Cannabis dependence ([place]) [F12.20] 11/04/2015  . Amphetamine abuse [F15.10] 11/04/2015  . Chronic use of opiate for therapeutic purpose [Z79.899] 11/04/2015  . Depression with suicidal ideation [F32.9, R45.851] 11/04/2015   History of Present Illness:PER [facility] Assessment Note-[name] is an 40 y.o. male. Presenting voluntarily for assessments with c/o paranoia, AH w/command, SA and emotional distress. Pt reports h/o anxiety w/ panic attacks, depression and ADD. Pt reports panic attacks have recently increased in frequency and now occur multiple times a week. Pt reports experiencing 2 attacks on yesterday (5.5.17). Pt reports that he is followed by Sherren Kerns (opt). Pt reports non-compliance with prescribed Neurontin. Pt reports h/o opiate use and that he currently receives Suboxone from [facility]. Pt reports daily use of Cannabis and Cocaine for the past two weeks. Pt reports that he has not slept or eaten x4days.   On Evaluation:  [name] is awake, alert and oriented X3, found resting in bed.  Denies suicidal or homicidal ideation at this time. Denies auditory or visual hallucination and does not appear to be responding to internal stimuli. Patient reports hx of auditory hallucination while using drugs. Patient reports information provided in assessment is validate. States  his depression 8/10. Support, encouragement and reassurance was provided.   Associated Signs/Symptoms: Depression Symptoms:  feelings of worthlessness/guilt, difficulty concentrating, hopelessness, suicidal thoughts without  plan, anxiety, disturbed sleep, weight loss, (Hypo) Manic Symptoms:  Distractibility, Hallucinations, Impulsivity, Irritable Mood, Anxiety Symptoms:  Excessive Worry, Psychotic Symptoms:  Hallucinations: Auditory PTSD Symptoms: Avoidance:  Foreshortened Future Total Time spent with patient: 45 minutes  Past Psychiatric History: See Above  Is the patient at risk to self? Yes.    Has the patient been a risk to self in the past 6 months? Yes.    Has the patient been a risk to self within the distant past? No.  Is the patient a risk to others? No.  Has the patient been a risk to others in the past 6 months? No.  Has the patient been a risk to others within the distant past? No.   Prior Inpatient Therapy: Prior Inpatient Therapy: Yes Prior Therapy Dates: Multiple, last admission 2009 Prior Therapy Facilty/Provider(s): [facility] Reason for Treatment: "I can't remember, probably the same thing" Prior Outpatient Therapy: Prior Outpatient Therapy: Yes Prior Therapy Dates: Ongoing Prior Therapy Facilty/Provider(s): [facility] Reason for Treatment: Depression, Anxiety Does patient have an ACCT team?: No Does patient have Intensive In-House Services?  : No Does patient have [facility] services? : No Does patient have P4CC services?: No  Alcohol Screening: 1. How often do you have a drink containing alcohol?: Never 9. Have you or someone else been injured as a result of your drinking?: No 10. Has a relative or friend or a doctor or another health worker been concerned about your drinking or suggested you cut down?: No Alcohol Use Disorder Identification Test Final Score (AUDIT): 0 Brief Intervention: AUDIT score less than 7 or less-screening does not suggest unhealthy drinking-brief intervention not indicated Substance Abuse History in the last 12 months:  Yes.   Consequences of Substance Abuse: Withdrawal Symptoms:   Cramps Headaches Nausea Tremors Previous Psychotropic Medications:  Yes  Psychological Evaluations: Yes Past Medical History:  Past Medical History  Diagnosis Date  . BPH (benign prostatic hyperplasia)   . Anxiety   . Depression   . ADHD (attention deficit hyperactivity disorder)   . Abdominal pain, recurrent     chronic lower abdominal pain  . Narcotic abuse     opiates  . Chronic pain   . Hypertension     Past Surgical History  Procedure Laterality Date  . Appendectomy  08/2005   Family History:  Family History  Problem Relation Age of Onset  . Hypertension Father   . Prostate cancer Other    Family Psychiatric  History: Father: Depression, paranoia, Bipolar  Tobacco Screening: '@FLOW' ([phone])::1)@ Social History:  History  Alcohol Use No    Comment: occasion     History  Drug Use  . Yes  . Special: Oxycodone, Opium, Cocaine    Additional Social History: Marital status: Single    Pain Medications: None Reported Prescriptions: Pt reports non-compliance with Ritalin, Prozac & Neurotin- pt reports he last received Suboxone from [facility] on 5.5.17 Over the Counter: None Reported History of alcohol / drug use?: Yes Longest period of sobriety (when/how long): 5-6 months Negative Consequences of Use: Personal relationships Withdrawal Symptoms: Nausea / Vomiting Name of Substance 1: Cannabis 1 - Age of First Use: 16 1 - Amount (size/oz): "as much as I can get" 1 - Frequency: daily for the past 2 weeks 1 - Duration: ongoing 1 - Last Use / Amount: Not Reported Name of Substance 2: Opiates 2 - Age of First Use: 29 2 - Amount (size/oz): "as much as I can get" 2 - Frequency: daily 2 - Duration: Ongoing 2 - Last Use / Amount: 1 pill yesterday, prior to that no use for 5-6 months Name of Substance 3: Cocaine 3 - Age of First Use: 21 3 - Amount (size/oz): $100 worth 3 - Frequency: daily 3 - Duration: daily for the past 2 weeks 3 - Last Use / Amount: Not Reported              Allergies:   Allergies  Allergen  Reactions  . Escitalopram Oxalate Hives and Swelling  . Contrast Media [Iodinated Diagnostic Agents] Nausea And Vomiting   Lab Results:  Results for orders placed or performed during the hospital encounter of 11/03/15 (from the past 48 hour(s))  CBC with Differential     Status: None   Collection Time: 11/03/15  9:13 PM  Result Value Ref Range   WBC 9.1 4.0 - 10.5 K/uL   RBC 4.70 4.22 - 5.81 MIL/uL   Hemoglobin 14.6 13.0 - 17.0 g/dL   HCT 44.2 39.0 - 52.0 %   MCV 94.0 78.0 - 100.0 fL   MCH 31.1 26.0 - 34.0 pg   MCHC 33.0 30.0 - 36.0 g/dL   RDW 13.4 11.5 - 15.5 %   Platelets 290 150 - 400 K/uL   Neutrophils Relative % 59 %   Neutro Abs 5.3 1.7 - 7.7 K/uL   Lymphocytes Relative 26 %   Lymphs Abs 2.4 0.7 - 4.0 K/uL   Monocytes Relative 9 %   Monocytes Absolute 0.8 0.1 - 1.0 K/uL   Eosinophils Relative 6 %   Eosinophils Absolute 0.6 0.0 - 0.7 K/uL   Basophils Relative 0 %   Basophils Absolute 0.0 0.0 - 0.1 K/uL  Comprehensive metabolic panel     Status: Abnormal   Collection Time: 11/03/15  9:13 PM  Result Value Ref Range  Sodium 139 135 - 145 mmol/L   Potassium 3.5 3.5 - 5.1 mmol/L   Chloride 102 101 - 111 mmol/L   CO2 29 22 - 32 mmol/L   Glucose, Bld 84 65 - 99 mg/dL   BUN 10 6 - 20 mg/dL   Creatinine, Ser 1.02 0.61 - 1.24 mg/dL   Calcium 8.7 (L) 8.9 - 10.3 mg/dL   Total Protein 6.2 (L) 6.5 - 8.1 g/dL   Albumin 4.2 3.5 - 5.0 g/dL   AST 25 15 - 41 U/L   ALT 17 17 - 63 U/L   Alkaline Phosphatase 44 38 - 126 U/L   Total Bilirubin 0.6 0.3 - 1.2 mg/dL   GFR calc non Af Amer >60 >60 mL/min   GFR calc Af Amer >60 >60 mL/min    Comment: (NOTE) The eGFR has been calculated using the CKD EPI equation. This calculation has not been validated in all clinical situations. eGFR's persistently <60 mL/min signify possible Chronic Kidney Disease.    Anion gap 8 5 - 15  Acetaminophen level     Status: Abnormal   Collection Time: 11/03/15  9:13 PM  Result Value Ref Range    Acetaminophen (Tylenol), Serum <10 (L) 10 - 30 ug/mL    Comment:        THERAPEUTIC CONCENTRATIONS VARY SIGNIFICANTLY. A RANGE OF 10-30 ug/mL MAY BE AN EFFECTIVE CONCENTRATION FOR MANY PATIENTS. HOWEVER, SOME ARE BEST TREATED AT CONCENTRATIONS OUTSIDE THIS RANGE. ACETAMINOPHEN CONCENTRATIONS >150 ug/mL AT 4 HOURS AFTER INGESTION AND >50 ug/mL AT 12 HOURS AFTER INGESTION ARE OFTEN ASSOCIATED WITH TOXIC REACTIONS.   Salicylate level     Status: None   Collection Time: 11/03/15  9:13 PM  Result Value Ref Range   Salicylate Lvl <9.0 2.8 - 30.0 mg/dL  Ethanol     Status: None   Collection Time: 11/03/15  9:13 PM  Result Value Ref Range   Alcohol, Ethyl (B) <5 <5 mg/dL    Comment:        LOWEST DETECTABLE LIMIT FOR SERUM ALCOHOL IS 5 mg/dL FOR MEDICAL PURPOSES ONLY   Urine rapid drug screen (hosp performed)     Status: Abnormal   Collection Time: 11/03/15  9:20 PM  Result Value Ref Range   Opiates NONE DETECTED NONE DETECTED   Cocaine POSITIVE (A) NONE DETECTED   Benzodiazepines NONE DETECTED NONE DETECTED   Amphetamines NONE DETECTED NONE DETECTED   Tetrahydrocannabinol POSITIVE (A) NONE DETECTED   Barbiturates NONE DETECTED NONE DETECTED    Comment:        DRUG SCREEN FOR MEDICAL PURPOSES ONLY.  IF CONFIRMATION IS NEEDED FOR ANY PURPOSE, NOTIFY LAB WITHIN 5 DAYS.        LOWEST DETECTABLE LIMITS FOR URINE DRUG SCREEN Drug Class       Cutoff (ng/mL) Amphetamine      1000 Barbiturate      200 Benzodiazepine   300 Tricyclics       923 Opiates          300 Cocaine          300 THC              50     Blood Alcohol level:  Lab Results  Component Value Date   ETH <5 11/03/2015   ETH <11 30/12/6224    Metabolic Disorder Labs:  No results found for: HGBA1C, MPG No results found for: PROLACTIN No results found for: CHOL, TRIG, HDL, CHOLHDL, VLDL, LDLCALC  Current Medications:  Current Facility-Administered Medications  Medication Dose Route Frequency Provider  Last Rate Last Dose  . acetaminophen (TYLENOL) tablet 650 mg  650 mg Oral Q6H PRN Dara Hoyer, PA-C      . dicyclomine (BENTYL) tablet 20 mg  20 mg Oral Q6H PRN Derrill Center, NP      . FLUoxetine (PROZAC) capsule 40 mg  40 mg Oral Daily Derrill Center, NP      . hydrOXYzine (ATARAX/VISTARIL) tablet 25 mg  25 mg Oral Q6H PRN Derrill Center, NP      . loperamide (IMODIUM) capsule 2-4 mg  2-4 mg Oral PRN Derrill Center, NP      . magnesium hydroxide (MILK OF MAGNESIA) suspension 30 mL  30 mL Oral Daily PRN Dara Hoyer, PA-C      . methocarbamol (ROBAXIN) tablet 500 mg  500 mg Oral Q8H PRN Derrill Center, NP      . naproxen (NAPROSYN) tablet 500 mg  500 mg Oral BID PRN Derrill Center, NP      . traZODone (DESYREL) tablet 100 mg  100 mg Oral QHS PRN Dara Hoyer, PA-C       PTA Medications: Prescriptions prior to admission  Medication Sig Dispense Refill Last Dose  . aspirin EC 81 MG tablet Take 81 mg by mouth at bedtime.    10/30/2015  . cyclobenzaprine (FLEXERIL) 5 MG tablet Take 1 tablet (5 mg total) by mouth 3 (three) times daily as needed for muscle spasms. (Patient not taking: Reported on 11/03/2015) 30 tablet 0 Not Taking at Unknown time  . FLUoxetine (PROZAC) 40 MG capsule Take 40 mg by mouth daily.   10/30/2015  . gabapentin (NEURONTIN) 300 MG capsule Take 300 mg by mouth 3 (three) times daily.   Past Week at Unknown time  . methylphenidate (RITALIN) 20 MG tablet Take 20 mg by mouth 2 (two) times daily.   11/02/2015  . Multiple Vitamin (MULTIVITAMIN) tablet Take 1 tablet by mouth daily.   11/01/2015  . SUBOXONE 8-2 MG FILM Take 1 Film by mouth 3 (three) times daily.   0 11/02/2015 at Unknown time  . traZODone (DESYREL) 100 MG tablet Take 100 mg by mouth at bedtime as needed for sleep.   1-2 months at Unknown time    Musculoskeletal: Strength & Muscle Tone: within normal limits Gait & Station: normal Patient leans: N/A  Psychiatric Specialty Exam: Physical Exam  Nursing note and  vitals reviewed. Constitutional: He is oriented to person, place, and time. He appears well-developed.  HENT:  Head: Normocephalic.  Cardiovascular: Normal rate.   Musculoskeletal: Normal range of motion.  Neurological: He is alert and oriented to person, place, and time.  Skin: Skin is warm and dry.    Review of Systems  Constitutional: Positive for fever, chills, weight loss and malaise/fatigue.  Gastrointestinal: Positive for diarrhea.  Musculoskeletal: Positive for joint pain.  Neurological: Positive for tremors.  Psychiatric/Behavioral: Positive for depression, suicidal ideas and hallucinations. The patient is nervous/anxious and has insomnia.   All other systems reviewed and are negative.   Blood pressure 87/53, pulse 61, temperature 98.5 F (36.9 C), temperature source Oral, resp. rate 16, height '5\' 8"'  (1.727 m), weight 64.864 kg (143 lb).Body mass index is 21.75 kg/(m^2).  General Appearance: Casual  Eye Contact::  Fair  Speech:  Clear and Coherent  Volume:  Normal  Mood:  Anxious and Depressed  Affect:  Congruent  Thought Process:  Coherent and Logical  Orientation:  Full (Time, Place, and Person)  Thought Content:  Hallucinations: Auditory and Rumination  Suicidal Thoughts:  No denies at this time  Homicidal Thoughts:  No  Memory:  Immediate;   Fair Recent;   Fair Remote;   Fair  Judgement:  Fair  Insight:  Fair  Psychomotor Activity:  Restlessness  Concentration:  Poor  Recall:  [employer] of Knowledge:Fair  Language: Fair  Akathisia:  NA  Handed:  Right  AIMS (if indicated):     Assets:  Communication Skills Desire for Improvement Resilience Social Support  ADL's:  Intact  Cognition: WNL  Sleep:  Number of Hours: 5       I agree with current treatment plan on 11/04/2015, Patient seen face-to-face for psychiatric evaluation follow-up, chart reviewed and case discussed with the MD [place]. Reviewed the information documented and agree with the treatment  plan.  Treatment Plan Summary: Daily contact with patient to assess and evaluate symptoms and progress in treatment and Medication management   Continue with Prozac for  mood stabilization. Continue with Trazodone 100 mg for insomnia Started on COWS/ Protocol Will continue to monitor vitals ,medication compliance and treatment side effects while patient is here.  Reviewed labs: BAL -, UDS - pos for cocaine, THC. CSW will start working on disposition.  Patient to participate in therapeutic milieu   Observation Level/Precautions:  15 minute checks  Laboratory:  CBC Chemistry Profile HCG UDS UA Reviewed   Psychotherapy:  Individual and group session  Medications:  Prozac, Methyphnidate and suboxon  Consultations:  Psychiatry  Discharge Concerns:  Safety, stabilization, and risk of access to medication and medication stabilization   Estimated LOS: 2-8ASUO  Other:     I certify that inpatient services furnished can reasonably be expected to improve the patient's condition.    Derrill Center, NP 5/7/201710:02 AM I personally assessed the patient, reviewed the physical exam and labs and formulated the treatment plan Geralyn Flash A. Sabra Heck, M.D.

## 2015-11-04 NOTE — Progress Notes (Signed)
Adult Psychoeducational Group Note  Date:  11/04/2015 Time:  9:58 PM  Group Topic/Focus:  Wrap-Up Group:   The focus of this group is to help patients review their daily goal of treatment and discuss progress on daily workbooks.  Participation Level:  Active  Participation Quality:  Appropriate  Affect:  Appropriate  Cognitive:  Alert  Insight: Appropriate  Engagement in Group:  Engaged  Modes of Intervention:  Discussion  Additional Comments:  Patient met goal. On a scale between 1-10, (1=worse, 10=best) patient rated his day a 1.   [place] L Samanta Gal 11/04/2015, 9:58 PM

## 2015-11-04 NOTE — Progress Notes (Addendum)
Patient ID: [name], male   DOB: 05/25/1976, 40 y.o.   MRN: [id number] Information     Patient Name Sex DOB SSN    [name], [name] Male 05/25/1976 [id number]     Progress Notes by Court Joyharles E Kober, PA-C at 11/03/2015 11:57 PM     Author: Court Joyharles E Kober, PA-C Service: Outpatient Author Type: Physician Assistant Certified    Filed: 11/04/2015 12:02 AM Note Time: 11/03/2015 11:57 PM Status: Signed    Editor: Court Joyharles E Kober, PA-C (Physician Assistant Certified)      Expand All Collapse All   Review of [facility] reveals pt filled RX for SUBOXONE 8/2 MG STRIPS #84/28 DAY SUPPLY from Dr Tollie EthPlummer ON 10/29/2015.In ED HE CLAIMED TO HAVE HAD NO SUBOXONE IN OVER 24 HRS? Also on 5/4 he filled rx for Ritallin 20 mg #60 from  Dr Mikeal HawthorneGarba .           UDS is negative for Amphetamine

## 2015-11-04 NOTE — [facility] Group Notes (Signed)
[facility] Group Notes: (Clinical Social Work)   11/04/2015      Type of Therapy:  Group Therapy   Participation Level:  Did Not Attend despite MHT prompting   Vinayak Bobier Grossman-Orr, LCSW 11/04/2015, 12:20 PM     

## 2015-11-04 NOTE — Progress Notes (Signed)
DAR NOTE: Pt present with flat affect and depressed mood in the unit. Pt has been visible in the milieu. Pt denies physical pain, took all his meds as scheduled. As per self inventory, pt had a fair night sleep, poor appetite, low energy, and poor concentration. Pt rate depression at 8, hopeless ness at 8, and anxiety at 8. Pt's goal for today is "stop using and become responsible." Pt's safety ensured with 15 minute and environmental checks. Pt currently denies SI/HI and A/V hallucinations. Pt verbally agrees to seek staff if SI/HI or A/VH occurs and to consult with staff before acting on these thoughts. Will continue POC.

## 2015-11-04 NOTE — Progress Notes (Signed)
Writer has observed patient up in the dayroom interacting with peers. He did Therapist, occupationalquestion writer as to why his medication was scheduled for 2200. He requested it to be given earlier. He wanted to know why he was placed on the 500 hall and writer explained him being paranoid had some part to do with him being here. Writer informed him of medications available if needed and would inform charge nurse if bed available on 300 patient would like to move. He denies feeling paranoid currently. Safety maintained on unit with 15 min checks.

## 2015-11-04 NOTE — [facility] Group Notes (Signed)
[facility] Group Notes:  (Nursing/MHT/Case Management/Adjunct)  Date:  11/04/2015  Time:  4:37 PM   Type of Therapy:  Psychoeducational Skills  Participation Level:  Did Not Attend  Participation Quality:  DID NOT ATTEND  Affect:  DID NOT ATTEND  Cognitive:  DID NOT ATTEND  Insight:  None  Engagement in Group:  DID NOT ATTEND  Modes of Intervention:  DID NOT ATTEND  Summary of Progress/Problems: Pt did not attend patient self inventory group.  Bethann PunchesJane O Koury Roddy 11/04/2015, 4:37 PM

## 2015-11-05 ENCOUNTER — Encounter (HOSPITAL_COMMUNITY): Payer: Self-pay | Admitting: Psychiatry

## 2015-11-05 DIAGNOSIS — F3289 Other specified depressive episodes: Secondary | ICD-10-CM

## 2015-11-05 DIAGNOSIS — F19239 Other psychoactive substance dependence with withdrawal, unspecified: Secondary | ICD-10-CM | POA: Clinically undetermined

## 2015-11-05 DIAGNOSIS — F19922 Other psychoactive substance use, unspecified with intoxication with perceptual disturbance: Secondary | ICD-10-CM | POA: Clinically undetermined

## 2015-11-05 DIAGNOSIS — F333 Major depressive disorder, recurrent, severe with psychotic symptoms: Principal | ICD-10-CM

## 2015-11-05 DIAGNOSIS — F32A Depression, unspecified: Secondary | ICD-10-CM | POA: Diagnosis present

## 2015-11-05 DIAGNOSIS — F1924 Other psychoactive substance dependence with psychoactive substance-induced mood disorder: Secondary | ICD-10-CM

## 2015-11-05 DIAGNOSIS — F1190 Opioid use, unspecified, uncomplicated: Secondary | ICD-10-CM

## 2015-11-05 DIAGNOSIS — F329 Major depressive disorder, single episode, unspecified: Secondary | ICD-10-CM | POA: Diagnosis present

## 2015-11-05 DIAGNOSIS — F1220 Cannabis dependence, uncomplicated: Secondary | ICD-10-CM | POA: Clinically undetermined

## 2015-11-05 DIAGNOSIS — F19950 Other psychoactive substance use, unspecified with psychoactive substance-induced psychotic disorder with delusions: Secondary | ICD-10-CM

## 2015-11-05 DIAGNOSIS — F1420 Cocaine dependence, uncomplicated: Secondary | ICD-10-CM | POA: Clinically undetermined

## 2015-11-05 DIAGNOSIS — F1121 Opioid dependence, in remission: Secondary | ICD-10-CM | POA: Clinically undetermined

## 2015-11-05 DIAGNOSIS — F909 Attention-deficit hyperactivity disorder, unspecified type: Secondary | ICD-10-CM | POA: Diagnosis present

## 2015-11-05 MED ORDER — OXYBUTYNIN CHLORIDE ER 5 MG PO TB24
5.0000 mg | ORAL_TABLET | Freq: Two times a day (BID) | ORAL | Status: DC
  Administered 2015-11-05 – 2015-11-10 (×10): 5 mg via ORAL
  Filled 2015-11-05 (×14): qty 1

## 2015-11-05 MED ORDER — BENZTROPINE MESYLATE 0.5 MG PO TABS
0.5000 mg | ORAL_TABLET | Freq: Every day | ORAL | Status: DC
  Administered 2015-11-06 – 2015-11-09 (×4): 0.5 mg via ORAL
  Filled 2015-11-05 (×7): qty 1

## 2015-11-05 MED ORDER — ALUM & MAG HYDROXIDE-SIMETH 200-200-20 MG/5ML PO SUSP: 30.0000 mL | ORAL | Status: DC | PRN

## 2015-11-05 MED ORDER — BOOST / RESOURCE BREEZE PO LIQD
1.0000 | Freq: Two times a day (BID) | ORAL | Status: DC
  Administered 2015-11-05 – 2015-11-09 (×10): 1 via ORAL
  Administered 2015-11-10: 10:00:00 via ORAL
  Filled 2015-11-05 (×15): qty 1

## 2015-11-05 MED ORDER — RISPERIDONE 0.5 MG PO TABS
0.5000 mg | ORAL_TABLET | Freq: Every day | ORAL | Status: DC
  Filled 2015-11-05 (×3): qty 1

## 2015-11-05 NOTE — Progress Notes (Signed)
DAR NOTE: Pt present with flat affect and depressed mood in the unit. Pt has been in the milieu. Pt stated that he was agitated with people because say they things which are not true. Pt did not want to elaborate further. Pt denies physical pain, took all his meds as scheduled. As per self inventory, pt had a good night sleep, good appetite, low energy, and poor concentration. Pt rate depression at 5, hopeless ness at 10, and anxiety at a 10. Pt's safety ensured with 15 minute and environmental checks. Pt currently denies SI/HI and A/V hallucinations. Pt verbally agrees to seek staff if SI/HI or A/VH occurs and to consult with staff before acting on these thoughts. Will continue POC.

## 2015-11-05 NOTE — Progress Notes (Signed)
[facility] MD Progress Note  11/05/2015 2:38 PM [name]  MRN:  [id number] Subjective:  Pt states " I want to get help with my abuse , I want to go to a structured environment . I hear voices and I am paranoid.'  Objective:[name] is a 40 y.o. Caucasian male, who has a hx of depression, psychosis as well as substance abuse ( cocaine, cannabis , opioid on maintnenace)  presented voluntarily for  c/o paranoia, AH w/command, SA and emotional distress.   Patient seen and chart reviewed.Discussed patient with treatment team.  Pt today reports a significant hx of cannabis heavily , cocaine heavily since the pasy several weeks. Pt is currently on Buprenorphine for opioid /heroin abuse. Pt reports AH asking him to abuse drugs , and severe paranoia. Pt reports he is very motivated to get help and wants to go to a structured place for substance abuse help. Per staff - pt is depressed, anxious , is compliant on medications.     Principal Problem: MDD (major depressive disorder), recurrent, severe, with psychosis ([place])  Diagnosis:   Patient Active Problem List   Diagnosis Date Noted  . Cocaine use disorder, moderate, dependence ([place]) [F14.20] 11/05/2015  . Cannabis use disorder, moderate, dependence ([place]) [F12.20] 11/05/2015  . Attention deficit hyperactivity disorder (ADHD) [F90.9] 11/05/2015  . Opioid use disorder, moderate, in early remission, on maintenance therapy [F11.90] 11/05/2015  . MDD (major depressive disorder), recurrent, severe, with psychosis ([place]) [F33.3] 11/05/2015   Total Time spent with patient: 30 minutes  Past Psychiatric History: as per H&P.  Past Medical History:  Past Medical History  Diagnosis Date  . BPH (benign prostatic hyperplasia)   . Anxiety   . Depression   . ADHD (attention deficit hyperactivity disorder)   . Abdominal pain, recurrent     chronic lower abdominal pain  . Narcotic abuse     opiates  . Chronic pain   . Hypertension     Past Surgical  History  Procedure Laterality Date  . Appendectomy  08/2005   Family History:  Family History  Problem Relation Age of Onset  . Hypertension Father   . Prostate cancer Other   . Mental illness Maternal Aunt   . Drug abuse Maternal Aunt    Family Psychiatric  History: As per H&P. Social History: As per H&P. History  Alcohol Use No    Comment: occasion     History  Drug Use  . Yes  . Special: Oxycodone, Opium, Cocaine    Social History   Social History  . Marital Status: Single    Spouse Name: N/A  . Number of Children: N/A  . Years of Education: N/A   Social History Main Topics  . Smoking status: Former Smoker -- 0.50 packs/day    Types: Cigarettes    Quit date: 03/10/2014  . Smokeless tobacco: Never Used  . Alcohol Use: No     Comment: occasion  . Drug Use: Yes    Special: Oxycodone, Opium, Cocaine  . Sexual Activity: Yes   Other Topics Concern  . None   Social History Narrative   Additional Social History:    Pain Medications: None Reported Prescriptions: Pt reports non-compliance with Ritalin, Prozac & Neurotin- pt reports he last received Suboxone from [facility] on 5.5.17 Over the Counter: None Reported History of alcohol / drug use?: Yes Longest period of sobriety (when/how long): 5-6 months Negative Consequences of Use: Personal relationships Withdrawal Symptoms: Nausea / Vomiting Name of Substance 1:  Cannabis 1 - Age of First Use: 16 1 - Amount (size/oz): "as much as I can get" 1 - Frequency: daily for the past 2 weeks 1 - Duration: ongoing 1 - Last Use / Amount: Not Reported Name of Substance 2: Opiates 2 - Age of First Use: 29 2 - Amount (size/oz): "as much as I can get" 2 - Frequency: daily 2 - Duration: Ongoing 2 - Last Use / Amount: 1 pill yesterday, prior to that no use for 5-6 months Name of Substance 3: Cocaine 3 - Age of First Use: 21 3 - Amount (size/oz): $100 worth 3 - Frequency: daily 3 - Duration: daily for the past 2  weeks 3 - Last Use / Amount: Not Reported              Sleep: Fair  Appetite:  Fair  Current Medications: Current Facility-Administered Medications  Medication Dose Route Frequency Provider Last Rate Last Dose  . acetaminophen (TYLENOL) tablet 650 mg  650 mg Oral Q6H PRN Dara Hoyer, PA-C      . benztropine (COGENTIN) tablet 0.5 mg  0.5 mg Oral QHS Kavya Haag, MD      . buprenorphine (SUBUTEX) sublingual tablet 8 mg  8 mg Sublingual BID Nicholaus Bloom, MD   8 mg at 11/05/15 0757  . dicyclomine (BENTYL) tablet 20 mg  20 mg Oral Q6H PRN Derrill Center, NP      . feeding supplement (BOOST / RESOURCE BREEZE) liquid 1 Container  1 Container Oral BID BM Ursula Alert, MD   1 Container at 11/05/15 1037  . FLUoxetine (PROZAC) capsule 40 mg  40 mg Oral Daily Derrill Center, NP   40 mg at 11/05/15 0757  . hydrOXYzine (ATARAX/VISTARIL) tablet 25 mg  25 mg Oral Q6H PRN Derrill Center, NP      . loperamide (IMODIUM) capsule 2-4 mg  2-4 mg Oral PRN Derrill Center, NP      . magnesium hydroxide (MILK OF MAGNESIA) suspension 30 mL  30 mL Oral Daily PRN Dara Hoyer, PA-C      . methocarbamol (ROBAXIN) tablet 500 mg  500 mg Oral Q8H PRN Derrill Center, NP      . methylphenidate (RITALIN) tablet 20 mg  20 mg Oral BID WC Nicholaus Bloom, MD   20 mg at 11/05/15 1210  . naproxen (NAPROSYN) tablet 500 mg  500 mg Oral BID PRN Derrill Center, NP      . nicotine (NICODERM CQ - dosed in mg/24 hours) patch 21 mg  21 mg Transdermal Daily Nicholaus Bloom, MD   21 mg at 11/05/15 0758  . oxybutynin (DITROPAN-XL) 24 hr tablet 5 mg  5 mg Oral BID Shaquill Iseman, MD      . risperiDONE (RISPERDAL) tablet 0.5 mg  0.5 mg Oral QHS Ursula Alert, MD      . traZODone (DESYREL) tablet 100 mg  100 mg Oral QHS PRN Dara Hoyer, PA-C        Lab Results:  Results for orders placed or performed during the hospital encounter of 11/03/15 (from the past 48 hour(s))  CBC with Differential     Status: None   Collection  Time: 11/03/15  9:13 PM  Result Value Ref Range   WBC 9.1 4.0 - 10.5 K/uL   RBC 4.70 4.22 - 5.81 MIL/uL   Hemoglobin 14.6 13.0 - 17.0 g/dL   HCT 44.2 39.0 - 52.0 %   MCV 94.0  78.0 - 100.0 fL   MCH 31.1 26.0 - 34.0 pg   MCHC 33.0 30.0 - 36.0 g/dL   RDW 13.4 11.5 - 15.5 %   Platelets 290 150 - 400 K/uL   Neutrophils Relative % 59 %   Neutro Abs 5.3 1.7 - 7.7 K/uL   Lymphocytes Relative 26 %   Lymphs Abs 2.4 0.7 - 4.0 K/uL   Monocytes Relative 9 %   Monocytes Absolute 0.8 0.1 - 1.0 K/uL   Eosinophils Relative 6 %   Eosinophils Absolute 0.6 0.0 - 0.7 K/uL   Basophils Relative 0 %   Basophils Absolute 0.0 0.0 - 0.1 K/uL  Comprehensive metabolic panel     Status: Abnormal   Collection Time: 11/03/15  9:13 PM  Result Value Ref Range   Sodium 139 135 - 145 mmol/L   Potassium 3.5 3.5 - 5.1 mmol/L   Chloride 102 101 - 111 mmol/L   CO2 29 22 - 32 mmol/L   Glucose, Bld 84 65 - 99 mg/dL   BUN 10 6 - 20 mg/dL   Creatinine, Ser 1.02 0.61 - 1.24 mg/dL   Calcium 8.7 (L) 8.9 - 10.3 mg/dL   Total Protein 6.2 (L) 6.5 - 8.1 g/dL   Albumin 4.2 3.5 - 5.0 g/dL   AST 25 15 - 41 U/L   ALT 17 17 - 63 U/L   Alkaline Phosphatase 44 38 - 126 U/L   Total Bilirubin 0.6 0.3 - 1.2 mg/dL   GFR calc non Af Amer >60 >60 mL/min   GFR calc Af Amer >60 >60 mL/min    Comment: (NOTE) The eGFR has been calculated using the CKD EPI equation. This calculation has not been validated in all clinical situations. eGFR's persistently <60 mL/min signify possible Chronic Kidney Disease.    Anion gap 8 5 - 15  Acetaminophen level     Status: Abnormal   Collection Time: 11/03/15  9:13 PM  Result Value Ref Range   Acetaminophen (Tylenol), Serum <10 (L) 10 - 30 ug/mL    Comment:        THERAPEUTIC CONCENTRATIONS VARY SIGNIFICANTLY. A RANGE OF 10-30 ug/mL MAY BE AN EFFECTIVE CONCENTRATION FOR MANY PATIENTS. HOWEVER, SOME ARE BEST TREATED AT CONCENTRATIONS OUTSIDE THIS RANGE. ACETAMINOPHEN CONCENTRATIONS >150  ug/mL AT 4 HOURS AFTER INGESTION AND >50 ug/mL AT 12 HOURS AFTER INGESTION ARE OFTEN ASSOCIATED WITH TOXIC REACTIONS.   Salicylate level     Status: None   Collection Time: 11/03/15  9:13 PM  Result Value Ref Range   Salicylate Lvl <8.1 2.8 - 30.0 mg/dL  Ethanol     Status: None   Collection Time: 11/03/15  9:13 PM  Result Value Ref Range   Alcohol, Ethyl (B) <5 <5 mg/dL    Comment:        LOWEST DETECTABLE LIMIT FOR SERUM ALCOHOL IS 5 mg/dL FOR MEDICAL PURPOSES ONLY   Urine rapid drug screen (hosp performed)     Status: Abnormal   Collection Time: 11/03/15  9:20 PM  Result Value Ref Range   Opiates NONE DETECTED NONE DETECTED   Cocaine POSITIVE (A) NONE DETECTED   Benzodiazepines NONE DETECTED NONE DETECTED   Amphetamines NONE DETECTED NONE DETECTED   Tetrahydrocannabinol POSITIVE (A) NONE DETECTED   Barbiturates NONE DETECTED NONE DETECTED    Comment:        DRUG SCREEN FOR MEDICAL PURPOSES ONLY.  IF CONFIRMATION IS NEEDED FOR ANY PURPOSE, NOTIFY LAB WITHIN 5 DAYS.  LOWEST DETECTABLE LIMITS FOR URINE DRUG SCREEN Drug Class       Cutoff (ng/mL) Amphetamine      1000 Barbiturate      200 Benzodiazepine   229 Tricyclics       798 Opiates          300 Cocaine          300 THC              50     Blood Alcohol level:  Lab Results  Component Value Date   [facility] <5 11/03/2015   ETH <11 06/21/2012    Physical Findings: AIMS: Facial and Oral Movements Muscles of Facial Expression: None, normal Lips and Perioral Area: None, normal Jaw: None, normal Tongue: None, normal,Extremity Movements Upper (arms, wrists, hands, fingers): None, normal Lower (legs, knees, ankles, toes): None, normal, Trunk Movements Neck, shoulders, hips: None, normal, Overall Severity Severity of abnormal movements (highest score from questions above): None, normal Incapacitation due to abnormal movements: None, normal Patient's awareness of abnormal movements (rate only patient's  report): No Awareness, Dental Status Current problems with teeth and/or dentures?: No Does patient usually wear dentures?: No  CIWA:  CIWA-Ar Total: 8 COWS:  COWS Total Score: 3  Musculoskeletal: Strength & Muscle Tone: within normal limits Gait & Station: normal Patient leans: N/A  Psychiatric Specialty Exam: Review of Systems  Psychiatric/Behavioral: Positive for depression, hallucinations and substance abuse. The patient is nervous/anxious.   All other systems reviewed and are negative.   Blood pressure 108/91, pulse 74, temperature 97.8 F (36.6 C), temperature source Oral, resp. rate 18, height '5\' 8"'  (1.727 m), weight 64.864 kg (143 lb).Body mass index is 21.75 kg/(m^2).  General Appearance: Casual  Eye Contact::  Fair  Speech:  Normal Rate  Volume:  Normal  Mood:  Anxious and Depressed  Affect:  Congruent  Thought Process:  Coherent  Orientation:  Full (Time, Place, and Person)  Thought Content:  Delusions, Hallucinations: Auditory, Paranoid Ideation and Rumination  Suicidal Thoughts:  No  Homicidal Thoughts:  No  Memory:  Immediate;   Fair Recent;   Fair Remote;   Fair  Judgement:  Impaired  Insight:  Shallow  Psychomotor Activity:  Normal  Concentration:  Fair  Recall:  [place]  Language: Fair  Akathisia:  No  Handed:  Right  AIMS (if indicated):     Assets:  Desire for Improvement  ADL's:  Intact  Cognition: WNL  Sleep:  Number of Hours: 5.25   Treatment Plan [place] is a 40 y.o. Caucasian male, who has a hx of depression, psychosis as well as substance abuse ( cocaine, cannabis , opioid on maintnenace)  presented voluntarily for  c/o paranoia, AH w/command, SA and emotional distress.Pt to be continued on treatment.    Daily contact with patient to assess and evaluate symptoms and progress in treatment and Medication management   Reviewed past medical records,treatment plan.  Will continue Prozac 40 mg po daily for  affective sx. Will add Risperidone 0.5 mg po qhs for psychosis. Will start Cogentin 0.5 mg po qhs for EPS. Will continue Trazodone 100 mg po qhs for sleep. Will add Ditropan XL 5 mg po bid for overactive bladder. Will continue Subutex 8 mg SL BID for opioid abuse . Will restart home medications where indicated. Will continue to monitor vitals ,medication compliance and treatment side effects while patient is here.  Will monitor for medical issues as well as call consult as  needed.  Reviewed labs - will get Hba1c, tsh, lipid panel, ekg - qtc - wnl. CSW will continue working on disposition. Pt to be referred to a substance abuse program. Patient to participate in therapeutic milieu .      Eliaz Fout, MD 11/05/2015, 2:38 PM

## 2015-11-05 NOTE — Progress Notes (Signed)
Adult Psychoeducational Group Note  Date:  11/05/2015 Time:  10:07 PM  Group Topic/Focus:  Wrap-Up Group:   The focus of this group is to help patients review their daily goal of treatment and discuss progress on daily workbooks.  Participation Level:  Active  Participation Quality:  Appropriate  Affect:  Appropriate  Cognitive:  Alert  Insight: Appropriate  Engagement in Group:  Engaged  Modes of Intervention:  Discussion  Additional Comments:  Patient states, "my day was so-so". Patient goal for today was "to try to convince my mom to talk to me".   Ritisha Deitrick L Emrik Erhard 11/05/2015, 10:07 PM

## 2015-11-05 NOTE — [facility] Group Notes (Signed)
[facility] Group Notes:  (Counselor/Nursing/MHT/Case Management/Adjunct)  11/05/2015 1:15PM  Type of Therapy:  Group Therapy  Participation Level:  Active  Participation Quality:  Appropriate  Affect:  Flat  Cognitive:  Oriented  Insight:  Improving  Engagement in Group:  Limited  Engagement in Therapy:  Limited  Modes of Intervention:  Discussion, Exploration and Socialization  Summary of Progress/Problems: The topic for group was balance in life.  Pt participated in the discussion about when their life was in balance and out of balance and how this feels.  Pt discussed ways to get back in balance and short term goals they can work on to get where they want to be. Stayed the entire time.  Engaged throughout.  "I'm having a hard time understanding what we are talking.  I can't concentrate right now."  Continued to try, though.  Eventually talked about overcoming emergency surgery.  Later, "celebrations hurt me.  Drinking always leads to bad things."  Stated he goes out golfing several times a week, "and I've learned to do that without drinking."   Daryel Geraldorth, Haidar Muse B 11/05/2015 5:05 PM

## 2015-11-05 NOTE — Tx Team (Signed)
Interdisciplinary Treatment Plan Update (Adult)  Date:  11/05/2015   Time Reviewed:  9:08 AM   Progress in Treatment: Attending groups: Yes. Participating in groups:  Yes. Taking medication as prescribed:  Yes. Tolerating medication:  Yes. Family/Significant other contact made:  No Patient understands diagnosis:  Yes  States he needs help with addiction Discussing patient identified problems/goals with staff:  Yes, see initial care plan. Medical problems stabilized or resolved:  Yes. Denies suicidal/homicidal ideation: Yes. Issues/concerns per patient self-inventory:  No. Other:  New problem(s) identified:  Discharge Plan or Barriers: see below  Reason for Continuation of Hospitalization: Depression Hallucinations Medication stabilization Other; describe paranoia  Comments:    Estimated length of stay: 4-5 days  New goal(s):  Review of initial/current patient goals per problem list:   Review of initial/current patient goals per problem list:  1. Goal(s): Patient will participate in aftercare plan   Met: No   Target date: 3-5 days post admission date   As evidenced by: Patient will participate within aftercare plan AEB aftercare provider and housing plan at discharge being identified. 11/05/15:  Despite being told there are no structured programs other than outpatient for folks on controlled medications, patient is insistent he needs to go into a program from here.Has been kicked out of his parents home after living there all his life.    2. Goal (s): Patient will exhibit decreased depressive symptoms and suicidal ideations.   Met: No   Target date: 3-5 days post admission date   As evidenced by: Patient will utilize self rating of depression at 3 or below and demonstrate decreased signs of depression or be deemed stable for discharge by MD. 11/05/15:  Rates his depression a 7 today  4. Goal(s): Patient will demonstrate decreased signs of withdrawal due to  substance abuse   Met: Yes   Target date: 3-5 days post admission date   As evidenced by: Patient will produce a CIWA/COWS score of 0, have stable vitals signs, and no symptoms of withdrawal 11/05/15:  No signs nor symptoms of withdrawal     5. Goal(s): Patient will demonstrate decreased signs of psychosis  * Met: No  * Target date: 3-5 days post admission date  * As evidenced by: Patient will demonstrate decreased frequency of AVH or return to baseline function 11/05/15:  Pt c/o AH telling him to use drugs, and paranoia          Attendees: Patient:  11/05/2015 9:08 AM   Family:   11/05/2015 9:08 AM   Physician:  Ursula Alert, MD 11/05/2015 9:08 AM   Nursing:   Grayland Ormond, RN 11/05/2015 9:08 AM   CSW:    Roque Lias, LCSW   11/05/2015 9:08 AM   Other:  11/05/2015 9:08 AM   Other:   11/05/2015 9:08 AM   Other:  Lars Pinks, Nurse CM 11/05/2015 9:08 AM   Other:   11/05/2015 9:08 AM   Other:  Norberto Sorenson, [place]  11/05/2015 9:08 AM   Other:  11/05/2015 9:08 AM   Other:  11/05/2015 9:08 AM   Other:  11/05/2015 9:08 AM   Other:  11/05/2015 9:08 AM   Other:  11/05/2015 9:08 AM   Other:   11/05/2015 9:08 AM    Scribe for Treatment Team:   Trish Mage, 11/05/2015 9:08 AM

## 2015-11-05 NOTE — Progress Notes (Signed)
NUTRITION ASSESSMENT  Pt identified as at risk on the Malnutrition Screen Tool  INTERVENTION: 1. Educated patient on the importance of nutrition and encouraged intake of food and beverages. 2. Discussed weight goals. 3. Supplements: will order Boost Breeze BID, each supplement provides 250 kcal and 9 grams of protein   NUTRITION DIAGNOSIS: Unintentional weight loss related to sub-optimal intake as evidenced by pt report.   Goal: Pt to meet >/= 90% of their estimated nutrition needs.  Monitor:  PO intake  Assessment:  Pt screened for MST. Pt admitted with depression with SI, MDD, psychotic features, and generalized anxiety with panic attacks. Per review, pt has lost 12 lbs (7.7% body weight) in the past 2 months which is significant for time frame. Will order Boost Breeze BID to increase protein intake.   40 y.o. male  Height: Ht Readings from Last 1 Encounters:  11/03/15 5\' 8"  (1.727 m)    Weight: Wt Readings from Last 1 Encounters:  11/03/15 143 lb (64.864 kg)    Weight Hx: Wt Readings from Last 10 Encounters:  11/03/15 143 lb (64.864 kg)  11/03/15 143 lb (64.864 kg)  10/10/14 152 lb (68.947 kg)  08/29/14 155 lb (70.308 kg)  07/08/14 160 lb (72.576 kg)  03/01/14 163 lb (73.936 kg)  11/16/13 157 lb (71.215 kg)  11/28/11 150 lb (68.04 kg)  11/17/11 152 lb 8 oz (69.174 kg)  10/16/11 152 lb (68.947 kg)    BMI:  Body mass index is 21.75 kg/(m^2). Pt meets criteria for normal weight based on current BMI.  Estimated Nutritional Needs: Kcal: 25-30 kcal/kg Protein: > 1 gram protein/kg Fluid: 1 ml/kcal  Diet Order: Diet Heart Room service appropriate?: Yes; Fluid consistency:: Thin Pt is also offered choice of unit snacks mid-morning and mid-afternoon.  Pt is eating as desired.   Lab results and medications reviewed.      Trenton GammonJessica Filmore Molyneux, RD, LDN Inpatient Clinical Dietitian Pager # [phone] After hours/weekend pager # [phone]

## 2015-11-06 LAB — LIPID PANEL
CHOL/HDL RATIO: 5.2 ratio
CHOLESTEROL: 177 mg/dL (ref 0–200)
HDL: 34 mg/dL — AB (ref >40)
LDL CALC: 79 mg/dL (ref 0–99)
TRIGLYCERIDES: 320 mg/dL — AB (ref <150)
VLDL: 64 mg/dL — AB (ref 0–40)

## 2015-11-06 LAB — TSH: TSH: 1.378 u[IU]/mL (ref 0.350–4.500)

## 2015-11-06 MED ORDER — RISPERIDONE 1 MG PO TABS
1.0000 mg | ORAL_TABLET | Freq: Every day | ORAL | Status: DC
  Filled 2015-11-06 (×2): qty 1

## 2015-11-06 MED ORDER — TRAZODONE HCL 150 MG PO TABS
150.0000 mg | ORAL_TABLET | Freq: Every evening | ORAL | Status: DC | PRN
  Administered 2015-11-06 – 2015-11-08 (×2): 150 mg via ORAL
  Filled 2015-11-06 (×2): qty 1

## 2015-11-06 MED ORDER — FLUOXETINE HCL 20 MG PO CAPS
60.0000 mg | ORAL_CAPSULE | Freq: Every day | ORAL | Status: DC
  Administered 2015-11-07 – 2015-11-10 (×4): 60 mg via ORAL
  Filled 2015-11-06 (×6): qty 3

## 2015-11-06 MED ORDER — ARIPIPRAZOLE 5 MG PO TABS
5.0000 mg | ORAL_TABLET | Freq: Every day | ORAL | Status: DC
  Administered 2015-11-06: 5 mg via ORAL
  Filled 2015-11-06 (×2): qty 1

## 2015-11-06 NOTE — Plan of Care (Signed)
Problem: Alteration in mood & ability to function due to Goal: STG-Patient will comply with prescribed medication regimen (Patient will comply with prescribed medication regimen)  Outcome: Not Progressing Pt refused scheduled HS medications on 11/05/15

## 2015-11-06 NOTE — [facility] Group Notes (Signed)
[facility] LCSW Group Therapy  11/06/2015 , 1:36 PM   Type of Therapy:  Group Therapy  Participation Level:  Active  Participation Quality:  Attentive  Affect:  Appropriate  Cognitive:  Alert  Insight:  Improving  Engagement in Therapy:  Engaged  Modes of Intervention:  Discussion, Exploration and Socialization  Summary of Progress/Problems: Today's group focused on the term Diagnosis.  Participants were asked to define the term, and then pronounce whether it is a negative, positive or neutral term. Invited.  Declined to attend.  Daryel Geraldorth, Merwyn Hodapp B 11/06/2015 , 1:36 PM

## 2015-11-06 NOTE — Progress Notes (Addendum)
[facility]  11/06/2015 3:11 PM [name]  MRN:  [id number] Subjective:  Pt states " I am still so so."  Objective:[name] is a 40 y.o. Caucasian male, who has a hx of depression, psychosis as well as substance abuse ( cocaine, cannabis , opioid on maintnenace)  presented voluntarily for  c/o paranoia, AH w/command, SA and emotional distress.   Patient seen and chart reviewed.Discussed patient with treatment team.  Pt today reports passive SI , continued anxiety/mood sx and AH asking him to do drugs . Pt reports paranoia as ongoing.Pt also reports sleep as restless last night . Pt continues to be interested in going to a substance abuse program. Pt continues to need encouragement to attend groups, take medications.    Principal Problem: MDD (major depressive disorder), recurrent, severe, with psychosis (HCC)  Diagnosis:   Patient Active Problem List   Diagnosis Date Noted  . Cocaine use disorder, moderate, dependence (HCC) [F14.20] 11/05/2015  . Cannabis use disorder, moderate, dependence (HCC) [F12.20] 11/05/2015  . Attention deficit hyperactivity disorder (ADHD) [F90.9] 11/05/2015  . Opioid use disorder, moderate, in early remission, on maintenance therapy [F11.90] 11/05/2015  . MDD (major depressive disorder), recurrent, severe, with psychosis (HCC) [F33.3] 11/05/2015   Total Time spent with patient: 25 minutes  Past Psychiatric History: as per H&P.  Past Medical History:  Past Medical History  Diagnosis Date  . BPH (benign prostatic hyperplasia)   . Anxiety   . Depression   . ADHD (attention deficit hyperactivity disorder)   . Abdominal pain, recurrent     chronic lower abdominal pain  . Narcotic abuse     opiates  . Chronic pain   . Hypertension     Past Surgical History  Procedure Laterality Date  . Appendectomy  08/2005   Family History:  Family History  Problem Relation Age of Onset  . Hypertension Father   . Prostate cancer Other   .  Mental illness Maternal Aunt   . Drug abuse Maternal Aunt    Family Psychiatric  History: As per H&P. Social History: As per H&P. History  Alcohol Use No    Comment: occasion     History  Drug Use  . Yes  . Special: Oxycodone, Opium, Cocaine    Social History   Social History  . Marital Status: Single    Spouse Name: N/A  . Number of Children: N/A  . Years of Education: N/A   Social History Main Topics  . Smoking status: Former Smoker -- 0.50 packs/day    Types: Cigarettes    Quit date: 03/10/2014  . Smokeless tobacco: Never Used  . Alcohol Use: No     Comment: occasion  . Drug Use: Yes    Special: Oxycodone, Opium, Cocaine  . Sexual Activity: Yes   Other Topics Concern  . None   Social History Narrative   Additional Social History:    Pain Medications: None Reported Prescriptions: Pt reports non-compliance with Ritalin, Prozac & Neurotin- pt reports he last received Suboxone from [facility] on 5.5.17 Over the Counter: None Reported History of alcohol / drug use?: Yes Longest period of sobriety (when/how long): 5-6 months Negative Consequences of Use: Personal relationships Withdrawal Symptoms: Nausea / Vomiting Name of Substance 1: Cannabis 1 - Age of First Use: 16 1 - Amount (size/oz): "as much as I can get" 1 - Frequency: daily for the past 2 weeks 1 - Duration: ongoing 1 - Last Use / Amount: Not Reported Name  of Substance 2: Opiates 2 - Age of First Use: 29 2 - Amount (size/oz): "as much as I can get" 2 - Frequency: daily 2 - Duration: Ongoing 2 - Last Use / Amount: 1 pill yesterday, prior to that no use for 5-6 months Name of Substance 3: Cocaine 3 - Age of First Use: 21 3 - Amount (size/oz): $100 worth 3 - Frequency: daily 3 - Duration: daily for the past 2 weeks 3 - Last Use / Amount: Not Reported              Sleep: Poor  Appetite:  Fair  Current Medications: Current Facility-Administered Medications  Medication Dose Route  Frequency Provider Last Rate Last Dose  . acetaminophen (TYLENOL) tablet 650 mg  650 mg Oral Q6H PRN Court Joy, PA-C      . ARIPiprazole (ABILIFY) tablet 5 mg  5 mg Oral QHS Burlon Centrella, MD      . benztropine (COGENTIN) tablet 0.5 mg  0.5 mg Oral QHS Jomarie Longs, MD   0.5 mg at 11/05/15 2108  . buprenorphine (SUBUTEX) sublingual tablet 8 mg  8 mg Sublingual BID Rachael Fee, MD   8 mg at 11/06/15 0914  . dicyclomine (BENTYL) tablet 20 mg  20 mg Oral Q6H PRN Oneta Rack, NP      . feeding supplement (BOOST / RESOURCE BREEZE) liquid 1 Container  1 Container Oral BID BM Jomarie Longs, MD   1 Container at 11/06/15 1321  . [START ON 11/07/2015] FLUoxetine (PROZAC) capsule 60 mg  60 mg Oral Daily Estill Llerena, MD      . hydrOXYzine (ATARAX/VISTARIL) tablet 25 mg  25 mg Oral Q6H PRN Oneta Rack, NP   25 mg at 11/06/15 1321  . loperamide (IMODIUM) capsule 2-4 mg  2-4 mg Oral PRN Oneta Rack, NP      . magnesium hydroxide (MILK OF MAGNESIA) suspension 30 mL  30 mL Oral Daily PRN Court Joy, PA-C      . methocarbamol (ROBAXIN) tablet 500 mg  500 mg Oral Q8H PRN Oneta Rack, NP      . methylphenidate (RITALIN) tablet 20 mg  20 mg Oral BID WC Rachael Fee, MD   20 mg at 11/06/15 1210  . naproxen (NAPROSYN) tablet 500 mg  500 mg Oral BID PRN Oneta Rack, NP      . nicotine (NICODERM CQ - dosed in mg/24 hours) patch 21 mg  21 mg Transdermal Daily Rachael Fee, MD   21 mg at 11/06/15 0843  . oxybutynin (DITROPAN-XL) 24 hr tablet 5 mg  5 mg Oral BID Jomarie Longs, MD   5 mg at 11/06/15 0915  . traZODone (DESYREL) tablet 150 mg  150 mg Oral QHS PRN Jomarie Longs, MD        Lab Results:  Results for orders placed or performed during the hospital encounter of 11/03/15 (from the past 48 hour(s))  TSH     Status: None   Collection Time: 11/06/15  6:25 AM  Result Value Ref Range   TSH 1.378 0.350 - 4.500 uIU/mL    Comment: Performed at [facility] ([place])  Lipid  panel     Status: Abnormal   Collection Time: 11/06/15  6:26 AM  Result Value Ref Range   Cholesterol 177 0 - 200 mg/dL   Triglycerides 811 (H) <150 mg/dL   HDL 34 (L) >91 mg/dL   Total CHOL/HDL Ratio 5.2 RATIO  VLDL 64 (H) 0 - 40 mg/dL   LDL Cholesterol 79 0 - 99 mg/dL    Comment:        Total Cholesterol/HDL:CHD Risk Coronary Heart Disease Risk Table                     Men   Women  1/2 Average Risk   3.4   3.3  Average Risk       5.0   4.4  2 X Average Risk   9.6   7.1  3 X Average Risk  23.4   11.0        Use the calculated Patient Ratio above and the CHD Risk Table to determine the patient's CHD Risk.        ATP III CLASSIFICATION (LDL):  <100     mg/dL   Optimal  621-308  mg/dL   Near or Above                    Optimal  130-159  mg/dL   Borderline  657-846  mg/dL   High  >962     mg/dL   Very High Performed at [facility]     Blood Alcohol level:  Lab Results  Component Value Date   [facility] <5 11/03/2015   ETH <11 06/21/2012    Physical Findings: AIMS: Facial and Oral Movements Muscles of Facial Expression: None, normal Lips and Perioral Area: None, normal Jaw: None, normal Tongue: None, normal,Extremity Movements Upper (arms, wrists, hands, fingers): None, normal Lower (legs, knees, ankles, toes): None, normal, Trunk Movements Neck, shoulders, hips: None, normal, Overall Severity Severity of abnormal movements (highest score from questions above): None, normal Incapacitation due to abnormal movements: None, normal Patient's awareness of abnormal movements (rate only patient's report): No Awareness, Dental Status Current problems with teeth and/or dentures?: No Does patient usually wear dentures?: No  CIWA:  CIWA-Ar Total: 2 COWS:  COWS Total Score: 2  Musculoskeletal: Strength & Muscle Tone: within normal limits Gait & Station: normal Patient leans: N/A  Psychiatric Specialty Exam: Review of Systems  Psychiatric/Behavioral: Positive for  depression, suicidal ideas, hallucinations and substance abuse. The patient is nervous/anxious and has insomnia.   All other systems reviewed and are negative.   Blood pressure 112/64, pulse 78, temperature 98 F (36.7 C), temperature source Oral, resp. rate 18, height  (1.727 m), weight 64.864 kg (143 lb).Body mass index is 21.75 kg/(m^2).  General Appearance: Casual  Eye Contact::  Fair  Speech:  Normal Rate  Volume:  Normal  Mood:  Anxious and Depressed  Affect:  Congruent  Thought Process:  Coherent  Orientation:  Full (Time, Place, and Person)  Thought Content:  Delusions, Hallucinations: Auditory, Paranoid Ideation and Rumination  Suicidal Thoughts:  Yes.  without intent/plan  Homicidal Thoughts:  No  Memory:  Immediate;   Fair Recent;   Fair Remote;   Fair  Judgement:  Impaired  Insight:  Shallow  Psychomotor Activity:  Normal  Concentration:  Fair  Recall:  [employer] of Knowledge:Fair  Language: Fair  Akathisia:  No  Handed:  Right  AIMS (if indicated):     Assets:  Desire for Improvement  ADL's:  Intact  Cognition: WNL  Sleep:  Number of Hours: 6.5   Treatment Plan Summary:[name] is a 62 y.o. Caucasian male, who has a hx of depression, psychosis as well as substance abuse ( cocaine, cannabis , opioid on maintnenace)  presented voluntarily  for  c/o paranoia, AH w/command, SA and emotional distress.Pt today continues to be anxious , suicidal .Pt to be continued on treatment.    Daily contact with patient to assess and evaluate symptoms and progress in treatment and Medication management   Reviewed past medical records,treatment plan.  Will increase Prozac to 60 mg po daily for affective sx. Will DC Risperidone- Pt reports he would like to be on Abilify - will start Abilify 5 mg po qhs for psychosis. Will continue Cogentin 0.5 mg po qhs for EPS. Will increase Trazodone to 150 mg po qhs for sleep. Will continue Ditropan XL 5 mg po bid for overactive  bladder. Will continue Subutex 8 mg SL BID for opioid abuse . Restarted home medications where indicated. Will continue to monitor vitals ,medication compliance and treatment side effects while patient is here.  Will monitor for medical issues as well as call consult as needed.  Reviewed labs - pending Hba1c, tsh- wnl , lipid panel- elevated triglycerides - will get dietician consult , ekg - qtc - wnl. CSW will continue working on disposition. Pt to be referred to a substance abuse program. Patient to participate in therapeutic milieu .      Zoriyah Scheidegger, MD 11/06/2015, 3:11 PM

## 2015-11-06 NOTE — Progress Notes (Signed)
Adult Psychoeducational Group Note  Date:  11/06/2015 Time:  9:33 PM  Group Topic/Focus:  Wrap-Up Group:   The focus of this group is to help patients review their daily goal of treatment and discuss progress on daily workbooks.  Participation Level:  Active  Participation Quality:  Appropriate  Affect:  Appropriate  Cognitive:  Alert  Insight: Appropriate  Engagement in Group:  Engaged  Modes of Intervention:  Discussion  Additional Comments:  Patient goal for today was "to talk with the doctor about switching my medication".  On a scale between 1-10, (1=worse, 10=best) patient rated his day a 2-3 "because I have been agitated all day".   Jonda Alanis L Krisna Omar 11/06/2015, 9:33 PM

## 2015-11-06 NOTE — Progress Notes (Signed)
D: Pt was in the day room upon initial approach.  Pt has depressed affect and mood.  Pt reports his day was "awful."  When asked why he described his day as awful, pt states "I don't know why."  Pt denies SI to writer but reports he was having thoughts of harming himself "earlier today."  He verbally contracts for safety.  Pt denies HI, denies hallucinations, denies pain.  Pt has been visible in the milieu and he attended evening group.   A: Introduced self to pt.  Met with pt and offered support and encouragement.  Actively listened to pt.  Medication education provided.  PRN medication administered for sleep. R: Pt is non-compliant with medications.  He refused Risperdal and Cogentin, reporting he does not want to take a medication "if I have to take something else to prevent side effects from it."  Pt verbally contracts for safety.  Will continue to monitor and assess.

## 2015-11-06 NOTE — [facility] Group Notes (Signed)
[facility] Group Notes:  (Nursing/MHT/Case Management/Adjunct)  Date:  11/06/2015  Time:  2:43 PM  Type of Therapy:  Nurse Education  Participation Level:  Active  Participation Quality:  Appropriate and Attentive  Affect:  Appropriate  Cognitive:  Alert and Appropriate  Insight:  Appropriate and Good  Engagement in Group:  Engaged  Modes of Intervention:  Discussion and Education  Summary of Progress/Problems: Topic was on recovery.  Group discussed what recovery means to them.  Group encouraged to stay on the road to recovery.  To stay focused and not be distracted from the process.  Patient was attentive and receptive.     [name] 11/06/2015, 2:43 PM

## 2015-11-06 NOTE — Progress Notes (Signed)
D: Pt visible in milieu majority of this shift. Verbally interactive with peers and staff. Denies SI, HI, AVH and pain with Clinical research associatewriter. However, pt endorsed SI with psychiatrist Pt was seen spitting his medications this afternoon, a male peers told staff. Pt did confirmed to staff that he cheeked and spat out his scheduled Vistaril, stated "I took it before and did not like the way it made me feel, but please I like my medications and do not want them to be stopped, I'm sorry".   A: Scheduled medications administered as ordered. Emotional support and availability provided to pt. Writer encouraged pt to voice concerns. Writer informed pt about changes to current medications regimen (started on Abilifyl). Mouth checks done with assigned 1:1 staff for 10-15 minutes at nursing desk to ensure medication compliance. Q 15 minutes checks remains effective for safety on and off unit without issues thus far this shift.  R: Pt receptive to care. Verbally contracted for safety. Pt is in agreement with medication changes. Compliant with medications when offered with supervision and mouth checks. Denies adverse drug reactions. POC continues for safety and mood stabilization.

## 2015-11-06 NOTE — Progress Notes (Signed)
Nutrition Note  Consult for hyperlipidemia education received. Per protocol, RN to provide pt with copy of packet outlining general healthy eating in addition to hyperlipidemia-specific information.   No further nutrition intervention warranted at this time. Please re-consult if further nutrition-related issues arise.    Amarah Brossman, RD, LDN Inpatient Clinical Dietitian Pager # [phone] After hours/weekend pager # [phone]     

## 2015-11-07 LAB — HEMOGLOBIN A1C
Hgb A1c MFr Bld: 5.3 % (ref 4.8–5.6)
MEAN PLASMA GLUCOSE: 105 mg/dL

## 2015-11-07 MED ORDER — ARIPIPRAZOLE 10 MG PO TABS
10.0000 mg | ORAL_TABLET | Freq: Every day | ORAL | Status: DC
  Administered 2015-11-07: 10 mg via ORAL
  Filled 2015-11-07 (×3): qty 1

## 2015-11-07 MED ORDER — ONDANSETRON 4 MG PO TBDP
8.0000 mg | ORAL_TABLET | Freq: Three times a day (TID) | ORAL | Status: DC | PRN
  Administered 2015-11-07 – 2015-11-09 (×3): 8 mg via ORAL
  Filled 2015-11-07 (×3): qty 2

## 2015-11-07 NOTE — [facility] Group Notes (Signed)
[facility]   11/07/2015 10:18 AM  Participation Quality:  Engaged  Mood/Affect:  Anxious and Flat  Depression Rating:    Anxiety Rating:    Thoughts of Suicide:  No Will you contract for safety?   NA  Current AVH:  Yes  Plan for Discharge/Comments:  Patient mentioned that he is tired and seemed anxious about discharge plan. States that he is unable to return to his parents house as they are unable to support him. Patient is also aware that facilities are limited due to his medications. Patient also mentioned that he will speak to his doctor about possibly discharging to [facility]. [facility] [place] in the past and believes he can stay there with his current medications. SW will locate phone number for the facility so that the patient can call and check on beds.  Transportation Means:   Supports:  [name], [name]

## 2015-11-07 NOTE — Progress Notes (Signed)
Patient ID: [name], male   DOB: 07-Nov-1975, 40 y.o.   MRN: [id number]  DAR: Pt. Denies SI/HI and A/V Hallucinations. Patient does report a headache this morning and was given Tylenol which was effective. He reports sleep is poor, appetite is fair, energy level is low, and concentration is poor. He rates depression 6/10, hopelessness 8/10, and anxiety 6/10. He reports this morning, "I'm not doing good." However throughout the day he appears less agitated and irritable than this morning. He does appear restless, usually walking the hall with a coffee cup in his hand. Support and encouragement provided to the patient. Scheduled medications administered to patient per physician's orders. Q15 minute checks are maintained for safety.

## 2015-11-07 NOTE — Progress Notes (Signed)
Recreation Therapy Notes  05.10.2017 Per MD order LRT met with patient to investigate ways to enhance tx during admission.  Patient shared prior to admission he was experiencing significant paranoia and was using cocaine. Patient reports he is living with his parents and they "constantly stay on me." Patient clarified this as wanting him to get a job and get out of their house. Patient reports he receives disability and if he gets a job his paycheck will interfere with his disability check. Patient identified drugs, golf and cigarettes as coping skills and leisure interest in golf, window shopping and meeting new friends.   Patient expressed specific interest in housing, LRT advised patient to talk with LCSW about his concerns.   Laureen Ochs Braylen Denunzio, LRT/CTRS   Lane Hacker 11/07/2015 3:55 PM

## 2015-11-07 NOTE — [facility] Group Notes (Signed)
[facility] [employer] [facility]  11/07/2015 , 12:10 PM    Type of Therapy:  [employer] Presentation  Participation Level:  Active  Participation Quality:  Attentive  Affect:  Blunted  Cognitive:  Oriented  Insight:  Limited  Engagement in Therapy:  Engaged  Modes of Intervention:  Discussion, Education and Socialization  Summary of Progress/Problems:  Onalee HuaDavid from [employer] came to present his recovery story and play the guitar.  Stayed the entire time.  Attentive throughout.  Did not engage with speaker.  Daryel Geraldorth, Mabry Santarelli B 11/07/2015 , 12:10 PM

## 2015-11-07 NOTE — Progress Notes (Signed)
[facility] MD Progress Note  11/07/2015 4:10 PM [name]  MRN:  [id number] Subjective:  Pt states " I am still anxious because of the voices in my head , I hear loud music which is very frustrating to me."   Objective:[name] is a 40 y.o. Caucasian male, who has a hx of depression, psychosis as well as substance abuse ( cocaine, cannabis , opioid on maintnenace)  presented voluntarily for  c/o paranoia, AH w/command, SA and emotional distress.   Patient seen and chart reviewed.Discussed patient with treatment team.  Pt today reports passive SI , as well as continued anxiety sx due to having AH of loud music all the time . Pt also has nausea today , likely from his buprenorphine - states he gets it on and off . Pt continues to be interested in going to a substance abuse program. Per staff pt  continues to need encouragement to attend groups, take medications.    Principal Problem: MDD (major depressive disorder), recurrent, severe, with psychosis (HCC)  Diagnosis:   Patient Active Problem List   Diagnosis Date Noted  . Cocaine use disorder, moderate, dependence (HCC) [F14.20] 11/05/2015  . Cannabis use disorder, moderate, dependence (HCC) [F12.20] 11/05/2015  . Attention deficit hyperactivity disorder (ADHD) [F90.9] 11/05/2015  . Opioid use disorder, moderate, in early remission, on maintenance therapy [F11.90] 11/05/2015  . MDD (major depressive disorder), recurrent, severe, with psychosis (HCC) [F33.3] 11/05/2015   Total Time spent with patient: 25 minutes  Past Psychiatric History: as per H&P.  Past Medical History:  Past Medical History  Diagnosis Date  . BPH (benign prostatic hyperplasia)   . Anxiety   . Depression   . ADHD (attention deficit hyperactivity disorder)   . Abdominal pain, recurrent     chronic lower abdominal pain  . Narcotic abuse     opiates  . Chronic pain   . Hypertension     Past Surgical History  Procedure Laterality Date  . Appendectomy   08/2005   Family History:  Family History  Problem Relation Age of Onset  . Hypertension Father   . Prostate cancer Other   . Mental illness Maternal Aunt   . Drug abuse Maternal Aunt    Family Psychiatric  History: As per H&P. Social History: As per H&P. History  Alcohol Use No    Comment: occasion     History  Drug Use  . Yes  . Special: Oxycodone, Opium, Cocaine    Social History   Social History  . Marital Status: Single    Spouse Name: N/A  . Number of Children: N/A  . Years of Education: N/A   Social History Main Topics  . Smoking status: Former Smoker -- 0.50 packs/day    Types: Cigarettes    Quit date: 03/10/2014  . Smokeless tobacco: Never Used  . Alcohol Use: No     Comment: occasion  . Drug Use: Yes    Special: Oxycodone, Opium, Cocaine  . Sexual Activity: Yes   Other Topics Concern  . None   Social History Narrative   Additional Social History:    Pain Medications: None Reported Prescriptions: Pt reports non-compliance with Ritalin, Prozac & Neurotin- pt reports he last received Suboxone from [facility] on 5.5.17 Over the Counter: None Reported History of alcohol / drug use?: Yes Longest period of sobriety (when/how long): 5-6 months Negative Consequences of Use: Personal relationships Withdrawal Symptoms: Nausea / Vomiting Name of Substance 1: Cannabis 1 - Age of First Use:  16 1 - Amount (size/oz): "as much as I can get" 1 - Frequency: daily for the past 2 weeks 1 - Duration: ongoing 1 - Last Use / Amount: Not Reported Name of Substance 2: Opiates 2 - Age of First Use: 29 2 - Amount (size/oz): "as much as I can get" 2 - Frequency: daily 2 - Duration: Ongoing 2 - Last Use / Amount: 1 pill yesterday, prior to that no use for 5-6 months Name of Substance 3: Cocaine 3 - Age of First Use: 21 3 - Amount (size/oz): $100 worth 3 - Frequency: daily 3 - Duration: daily for the past 2 weeks 3 - Last Use / Amount: Not Reported               Sleep: Fair  Appetite:  Fair  Current Medications: Current Facility-Administered Medications  Medication Dose Route Frequency Provider Last Rate Last Dose  . acetaminophen (TYLENOL) tablet 650 mg  650 mg Oral Q6H PRN Court Joy, PA-C   650 mg at 11/07/15 0847  . ARIPiprazole (ABILIFY) tablet 10 mg  10 mg Oral QHS Noeh Sparacino, MD      . benztropine (COGENTIN) tablet 0.5 mg  0.5 mg Oral QHS Makisha Marrin, MD   0.5 mg at 11/06/15 2105  . buprenorphine (SUBUTEX) sublingual tablet 8 mg  8 mg Sublingual BID Rachael Fee, MD   8 mg at 11/07/15 0844  . dicyclomine (BENTYL) tablet 20 mg  20 mg Oral Q6H PRN Oneta Rack, NP      . feeding supplement (BOOST / RESOURCE BREEZE) liquid 1 Container  1 Container Oral BID BM Jomarie Longs, MD   1 Container at 11/07/15 1449  . FLUoxetine (PROZAC) capsule 60 mg  60 mg Oral Daily Jomarie Longs, MD   60 mg at 11/07/15 0844  . hydrOXYzine (ATARAX/VISTARIL) tablet 25 mg  25 mg Oral Q6H PRN Oneta Rack, NP   25 mg at 11/07/15 1022  . loperamide (IMODIUM) capsule 2-4 mg  2-4 mg Oral PRN Oneta Rack, NP      . magnesium hydroxide (MILK OF MAGNESIA) suspension 30 mL  30 mL Oral Daily PRN Court Joy, PA-C      . methocarbamol (ROBAXIN) tablet 500 mg  500 mg Oral Q8H PRN Oneta Rack, NP      . methylphenidate (RITALIN) tablet 20 mg  20 mg Oral BID WC Rachael Fee, MD   20 mg at 11/07/15 1210  . naproxen (NAPROSYN) tablet 500 mg  500 mg Oral BID PRN Oneta Rack, NP      . nicotine (NICODERM CQ - dosed in mg/24 hours) patch 21 mg  21 mg Transdermal Daily Rachael Fee, MD   21 mg at 11/07/15 0847  . ondansetron (ZOFRAN-ODT) disintegrating tablet 8 mg  8 mg Oral Q8H PRN Jomarie Longs, MD      . oxybutynin (DITROPAN-XL) 24 hr tablet 5 mg  5 mg Oral BID Jomarie Longs, MD   5 mg at 11/07/15 0844  . traZODone (DESYREL) tablet 150 mg  150 mg Oral QHS PRN Jomarie Longs, MD   150 mg at 11/06/15 2104    Lab Results:  Results for  orders placed or performed during the hospital encounter of 11/03/15 (from the past 48 hour(s))  TSH     Status: None   Collection Time: 11/06/15  6:25 AM  Result Value Ref Range   TSH 1.378 0.350 - 4.500 uIU/mL  Comment: Performed at [facility]  Lipid panel     Status: Abnormal   Collection Time: 11/06/15  6:26 AM  Result Value Ref Range   Cholesterol 177 0 - 200 mg/dL   Triglycerides 540 (H) <150 mg/dL   HDL 34 (L) >98 mg/dL   Total CHOL/HDL Ratio 5.2 RATIO   VLDL 64 (H) 0 - 40 mg/dL   LDL Cholesterol 79 0 - 99 mg/dL    Comment:        Total Cholesterol/HDL:CHD Risk Coronary Heart Disease Risk Table                     Men   Women  1/2 Average Risk   3.4   3.3  Average Risk       5.0   4.4  2 X Average Risk   9.6   7.1  3 X Average Risk  23.4   11.0        Use the calculated Patient Ratio above and the CHD Risk Table to determine the patient's CHD Risk.        ATP III CLASSIFICATION (LDL):  <100     mg/dL   Optimal  119-147  mg/dL   Near or Above                    Optimal  130-159  mg/dL   Borderline  829-562  mg/dL   High  >130     mg/dL   Very High Performed at [facility]   Hemoglobin A1c     Status: None   Collection Time: 11/06/15  6:26 AM  Result Value Ref Range   Hgb A1c MFr Bld 5.3 4.8 - 5.6 %    Comment: (NOTE)         Pre-diabetes: 5.7 - 6.4         Diabetes: >6.4         Glycemic control for adults with diabetes: <7.0    Mean Plasma Glucose 105 mg/dL    Comment: (NOTE) Performed At: [facility] [address] [place], [place] [id number] Mila Homer MD EX:[id number] Performed at [facility]     Blood Alcohol level:  Lab Results  Component Value Date   [facility] <5 11/03/2015   ETH <11 06/21/2012    Physical Findings: AIMS: Facial and Oral Movements Muscles of Facial Expression: None, normal Lips and Perioral Area: None, normal Jaw: None, normal Tongue: None, normal,Extremity  Movements Upper (arms, wrists, hands, fingers): None, normal Lower (legs, knees, ankles, toes): None, normal, Trunk Movements Neck, shoulders, hips: None, normal, Overall Severity Severity of abnormal movements (highest score from questions above): None, normal Incapacitation due to abnormal movements: None, normal Patient's awareness of abnormal movements (rate only patient's report): No Awareness, Dental Status Current problems with teeth and/or dentures?: No Does patient usually wear dentures?: No  CIWA:  CIWA-Ar Total: 2 COWS:  COWS Total Score: 5  Musculoskeletal: Strength & Muscle Tone: within normal limits Gait & Station: normal Patient leans: N/A  Psychiatric Specialty Exam: Review of Systems  Gastrointestinal: Positive for nausea.  Psychiatric/Behavioral: Positive for depression, suicidal ideas, hallucinations (loud music) and substance abuse. The patient is nervous/anxious.   All other systems reviewed and are negative.   Blood pressure 100/64, pulse 64, temperature 97.9 F (36.6 C), temperature source Oral, resp. rate 16, height 5\' 8"  (1.727 m), weight 64.864 kg (143 lb).Body mass index is 21.75 kg/(m^2).  General  Appearance: Casual  Eye Contact::  Fair  Speech:  Normal Rate  Volume:  Normal  Mood:  Anxious and Depressed  Affect:  Congruent  Thought Process:  Coherent  Orientation:  Full (Time, Place, and Person)  Thought Content:  Delusions, Hallucinations: Auditory, Paranoid Ideation and Rumination  Suicidal Thoughts:  Yes.  without intent/plan  Homicidal Thoughts:  No  Memory:  Immediate;   Fair Recent;   Fair Remote;   Fair  Judgement:  Impaired  Insight:  Shallow  Psychomotor Activity:  Normal  Concentration:  Fair  Recall:  [employer]Fair  Fund of Knowledge:Fair  Language: Fair  Akathisia:  No  Handed:  Right  AIMS (if indicated):     Assets:  Desire for Improvement  ADL's:  Intact  Cognition: WNL  Sleep:  Number of Hours: 6.75   Treatment Plan  Summary:[name] is a 40 y.o. Caucasian male, who has a hx of depression, psychosis as well as substance abuse ( cocaine, cannabis , opioid on maintnenace)  presented voluntarily for  c/o paranoia, AH w/command, SA and emotional distress.Pt today continues to be anxious ,has AH , and is suicidal .Pt to be continued on treatment.    Daily contact with patient to assess and evaluate symptoms and progress in treatment and Medication management   Reviewed past medical records,treatment plan.  Increased Prozac to 60 mg po daily for affective sx. Will increase Abilify to 10 mg po qhs for psychosis. Will continue Cogentin 0.5 mg po qhs for EPS. Will continue Trazodone  150 mg po qhs for sleep. Will continue Ditropan XL 5 mg po bid for overactive bladder. Will continue Subutex 8 mg SL BID for opioid abuse . Will start Zofran 8 mg po q8h prn for nausea. Restarted home medications where indicated. Will continue to monitor vitals ,medication compliance and treatment side effects while patient is here.  Will monitor for medical issues as well as call consult as needed.  Reviewed labs - pending Hba1c, tsh- wnl , lipid panel- elevated triglycerides -  dietician consult , ekg - qtc - wnl. CSW will continue working on disposition. Pt to be referred to a substance abuse program. Patient to participate in therapeutic milieu .      Daegen Berrocal, MD 11/07/2015, 4:10 PM

## 2015-11-07 NOTE — Progress Notes (Signed)
D: Pt endorses moderate depression and anxiety; states, "right now I'm homeless; I don't know what tomorrow would bring" Pt also endorses passive SI however, contracts for safety; states, "I'm not thinking about doing anything to myself at this moment but I had earlier." Pt denied pain, HI or AVH. Pt remained calm and cooperative. A: Medications offered as prescribed.  Support, encouragement, and safe environment provided.  15-minute safety checks continue. R: Pt was med compliant.  Pt attended wrap-up group. Safety checks continue.

## 2015-11-07 NOTE — Plan of Care (Signed)
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

## 2015-11-08 MED ORDER — ARIPIPRAZOLE 15 MG PO TABS
15.0000 mg | ORAL_TABLET | Freq: Every day | ORAL | Status: DC
  Administered 2015-11-08 – 2015-11-09 (×2): 15 mg via ORAL
  Filled 2015-11-08 (×5): qty 1

## 2015-11-08 NOTE — Progress Notes (Signed)
Recreation Therapy Notes  05.11.2017 LRT returned with stress management literature for patient and encouraged patient to use as a coping skills post d/c. Patient accepted resources, but expressed ambiguity about application. LRT offered to get patient information on information on [place] with city parks and recreation department. Patient again expressed ambiguity stating "You can get them for me, but I probably wont use them." LRT to provide and encourage patient to use post d/c to encourage healthy coping skill.   Marykay Lexenise L Sargon Scouten, LRT/CTRS    Jearl KlinefelterBlanchfield, Kavin Weckwerth L 11/08/2015 4:17 PM

## 2015-11-08 NOTE — Progress Notes (Signed)
Pt was very argumentative about having to take the Subutex and wait while the nurse was watching.  He kept saying "you are just pissing me off that I have to be watched like a child."  Explained to him that staff have to monitor the Subutex while it is melting under the tongue.  He was able to sit in the dayroom with staff by side until the pill dissolved.

## 2015-11-08 NOTE — Progress Notes (Signed)
D: Patient denies SI/HI or AVH.  Pt. Appears anxious and depressed.  He states that he did not meet his goal today to find a facility that will accept him with his current medication.  He is up and interacting with others on the unit.    A: Patient given emotional support from RN. Patient encouraged to come to staff with concerns and/or questions. Patient's medication routine continued. Patient's orders and plan of care reviewed.   R: Patient remains appropriate and cooperative. Will continue to monitor patient q15 minutes for safety.

## 2015-11-08 NOTE — [facility] Group Notes (Signed)
Pt attended karaoke group.  Katalena Malveaux, MHT 

## 2015-11-08 NOTE — Progress Notes (Signed)
Patient ID: [name], male   DOB: 25-Mar-1976, 40 y.o.   MRN: [id number]  DAR: Pt. Denies SI/HI and A/V Hallucinations. He reports some lightheadedness related to his low BP. He was given gatorade and encouraged to continue to push fluids in order to keep his BP up. [name] verbalized understanding of this. He reports sleep is fair, appetite is fair, energy level is low, and concentration is poor. He rates depression 6/10, hopelessness 6/10, and anxiety 7/10. He does report a headache this morning but did not want intervention.Support and encouragement provided to the patient. Scheduled medications administered to patient per physician's orders. [name] is seen in the milieu at times and no behavioral incidents noted this morning. He remains anxious and restless but cooperative. He came to morning group but was late and inattentive. Q15 minute checks are maintained for safety.

## 2015-11-08 NOTE — [facility] Group Notes (Signed)
[facility] Group Notes:  (Counselor/Nursing/MHT/Case Management/Adjunct)  11/08/2015 1:15PM  Type of Therapy:  Group Therapy  Participation Level:  Active  Participation Quality:  Appropriate  Affect:  Flat  Cognitive:  Oriented  Insight:  Improving  Engagement in Group:  Limited  Engagement in Therapy:  Limited  Modes of Intervention:  Discussion, Exploration and Socialization  Summary of Progress/Problems: The topic for group was balance in life.  Pt participated in the discussion about when their life was in balance and out of balance and how this feels.  Pt discussed ways to get back in balance and short term goals they can work on to get where they want to be.  Came briefly.  Left and did not return.   Ida Rogueorth, Romyn Boswell B 11/08/2015 3:57 PM

## 2015-11-08 NOTE — [facility] Group Notes (Signed)
[facility] Group Notes:  (Nursing/MHT/Case Management/Adjunct)  Date:  11/08/2015  Time:  10:55 AM  Type of Therapy:  Nurse Education  Participation Level:  None  Participation Quality:  Inattentive  Affect:  Anxious  Cognitive:  Alert  Insight:  None  Engagement in Group:  None  Modes of Intervention:  Discussion, Education, Orientation and Support  Summary of Progress/Problems: [name] came into group late and was focused on the coffee pot, tipping it and trying to get coffee although there was none. He received a daily workbook.   [name], [name] 11/08/2015, 10:55 AM

## 2015-11-08 NOTE — Progress Notes (Signed)
[facility] MD Progress Note  11/08/2015 3:11 PM [name]  MRN:  [id number] Subjective:  Pt states " I am still frustrated by these voices.'     Objective:[name] is a 40 y.o. Caucasian male, who has a hx of depression, psychosis as well as substance abuse ( cocaine, cannabis , opioid on maintnenace)  presented voluntarily for  c/o paranoia, AH w/command, SA and emotional distress.   Patient seen and chart reviewed.Discussed patient with treatment team.  Pt today continues to report passive SI , as well as loud voices . Pt is more visible in milieu. Per staff has been compliant on medications , denies ADRs.   Principal Problem: MDD (major depressive disorder), recurrent, severe, with psychosis (HCC)  Diagnosis:   Patient Active Problem List   Diagnosis Date Noted  . Cocaine use disorder, moderate, dependence (HCC) [F14.20] 11/05/2015  . Cannabis use disorder, moderate, dependence (HCC) [F12.20] 11/05/2015  . Attention deficit hyperactivity disorder (ADHD) [F90.9] 11/05/2015  . Opioid use disorder, moderate, in early remission, on maintenance therapy [F11.90] 11/05/2015  . MDD (major depressive disorder), recurrent, severe, with psychosis (HCC) [F33.3] 11/05/2015   Total Time spent with patient: 25 minutes  Past Psychiatric History: as per H&P.  Past Medical History:  Past Medical History  Diagnosis Date  . BPH (benign prostatic hyperplasia)   . Anxiety   . Depression   . ADHD (attention deficit hyperactivity disorder)   . Abdominal pain, recurrent     chronic lower abdominal pain  . Narcotic abuse     opiates  . Chronic pain   . Hypertension     Past Surgical History  Procedure Laterality Date  . Appendectomy  08/2005   Family History:  Family History  Problem Relation Age of Onset  . Hypertension Father   . Prostate cancer Other   . Mental illness Maternal Aunt   . Drug abuse Maternal Aunt    Family Psychiatric  History: As per H&P. Social History: As per  H&P. History  Alcohol Use No    Comment: occasion     History  Drug Use  . Yes  . Special: Oxycodone, Opium, Cocaine    Social History   Social History  . Marital Status: Single    Spouse Name: N/A  . Number of Children: N/A  . Years of Education: N/A   Social History Main Topics  . Smoking status: Former Smoker -- 0.50 packs/day    Types: Cigarettes    Quit date: 03/10/2014  . Smokeless tobacco: Never Used  . Alcohol Use: No     Comment: occasion  . Drug Use: Yes    Special: Oxycodone, Opium, Cocaine  . Sexual Activity: Yes   Other Topics Concern  . None   Social History Narrative   Additional Social History:    Pain Medications: None Reported Prescriptions: Pt reports non-compliance with Ritalin, Prozac & Neurotin- pt reports he last received Suboxone from [facility] on 5.5.17 Over the Counter: None Reported History of alcohol / drug use?: Yes Longest period of sobriety (when/how long): 5-6 months Negative Consequences of Use: Personal relationships Withdrawal Symptoms: Nausea / Vomiting Name of Substance 1: Cannabis 1 - Age of First Use: 16 1 - Amount (size/oz): "as much as I can get" 1 - Frequency: daily for the past 2 weeks 1 - Duration: ongoing 1 - Last Use / Amount: Not Reported Name of Substance 2: Opiates 2 - Age of First Use: 29 2 - Amount (size/oz): "as much as  I can get" 2 - Frequency: daily 2 - Duration: Ongoing 2 - Last Use / Amount: 1 pill yesterday, prior to that no use for 5-6 months Name of Substance 3: Cocaine 3 - Age of First Use: 21 3 - Amount (size/oz): $100 worth 3 - Frequency: daily 3 - Duration: daily for the past 2 weeks 3 - Last Use / Amount: Not Reported              Sleep: Fair  Appetite:  Fair  Current Medications: Current Facility-Administered Medications  Medication Dose Route Frequency Provider Last Rate Last Dose  . acetaminophen (TYLENOL) tablet 650 mg  650 mg Oral Q6H PRN Court Joy, PA-C   650  mg at 11/07/15 0847  . ARIPiprazole (ABILIFY) tablet 15 mg  15 mg Oral QHS Eiko Mcgowen, MD      . benztropine (COGENTIN) tablet 0.5 mg  0.5 mg Oral QHS Jomarie Longs, MD   0.5 mg at 11/07/15 2109  . buprenorphine (SUBUTEX) sublingual tablet 8 mg  8 mg Sublingual BID Rachael Fee, MD   8 mg at 11/08/15 0752  . dicyclomine (BENTYL) tablet 20 mg  20 mg Oral Q6H PRN Oneta Rack, NP      . feeding supplement (BOOST / RESOURCE BREEZE) liquid 1 Container  1 Container Oral BID BM Jomarie Longs, MD   1 Container at 11/08/15 1424  . FLUoxetine (PROZAC) capsule 60 mg  60 mg Oral Daily Jomarie Longs, MD   60 mg at 11/08/15 0755  . hydrOXYzine (ATARAX/VISTARIL) tablet 25 mg  25 mg Oral Q6H PRN Oneta Rack, NP   25 mg at 11/07/15 1022  . loperamide (IMODIUM) capsule 2-4 mg  2-4 mg Oral PRN Oneta Rack, NP      . magnesium hydroxide (MILK OF MAGNESIA) suspension 30 mL  30 mL Oral Daily PRN Court Joy, PA-C      . methocarbamol (ROBAXIN) tablet 500 mg  500 mg Oral Q8H PRN Oneta Rack, NP      . methylphenidate (RITALIN) tablet 20 mg  20 mg Oral BID WC Rachael Fee, MD   20 mg at 11/08/15 1152  . naproxen (NAPROSYN) tablet 500 mg  500 mg Oral BID PRN Oneta Rack, NP      . nicotine (NICODERM CQ - dosed in mg/24 hours) patch 21 mg  21 mg Transdermal Daily Rachael Fee, MD   21 mg at 11/08/15 0756  . ondansetron (ZOFRAN-ODT) disintegrating tablet 8 mg  8 mg Oral Q8H PRN Jomarie Longs, MD   8 mg at 11/08/15 1425  . oxybutynin (DITROPAN-XL) 24 hr tablet 5 mg  5 mg Oral BID Jomarie Longs, MD   5 mg at 11/08/15 0755  . traZODone (DESYREL) tablet 150 mg  150 mg Oral QHS PRN Jomarie Longs, MD   150 mg at 11/06/15 2104    Lab Results:  No results found for this or any previous visit (from the past 48 hour(s)).  Blood Alcohol level:  Lab Results  Component Value Date   [facility] <5 11/03/2015   ETH <11 06/21/2012    Physical Findings: AIMS: Facial and Oral Movements Muscles of Facial  Expression: None, normal Lips and Perioral Area: None, normal Jaw: None, normal Tongue: None, normal,Extremity Movements Upper (arms, wrists, hands, fingers): None, normal Lower (legs, knees, ankles, toes): None, normal, Trunk Movements Neck, shoulders, hips: None, normal, Overall Severity Severity of abnormal movements (highest score from questions above): None,  normal Incapacitation due to abnormal movements: None, normal Patient's awareness of abnormal movements (rate only patient's report): No Awareness, Dental Status Current problems with teeth and/or dentures?: No Does patient usually wear dentures?: No  CIWA:  CIWA-Ar Total: 2 COWS:  COWS Total Score: 1  Musculoskeletal: Strength & Muscle Tone: within normal limits Gait & Station: normal Patient leans: N/A  Psychiatric Specialty Exam: Review of Systems  Psychiatric/Behavioral: Positive for depression, suicidal ideas, hallucinations (loud music) and substance abuse. The patient is nervous/anxious.   All other systems reviewed and are negative.   Blood pressure 105/56, pulse 93, temperature 97.5 F (36.4 C), temperature source Oral, resp. rate 18, height 5\' 8"  (1.727 m), weight 64.864 kg (143 lb).Body mass index is 21.75 kg/(m^2).  General Appearance: Casual  Eye Contact::  Fair  Speech:  Normal Rate  Volume:  Normal  Mood:  Anxious and Depressed  Affect:  Congruent  Thought Process:  Coherent  Orientation:  Full (Time, Place, and Person)  Thought Content:  Delusions, Hallucinations: Auditory, Paranoid Ideation and Rumination  Suicidal Thoughts:  Yes.  without intent/plan  Homicidal Thoughts:  No  Memory:  Immediate;   Fair Recent;   Fair Remote;   Fair  Judgement:  Impaired  Insight:  Shallow  Psychomotor Activity:  Normal  Concentration:  Fair  Recall:  [employer]  Fund of Knowledge:Fair  Language: Fair  Akathisia:  No  Handed:  Right  AIMS (if indicated):     Assets:  Desire for Improvement  ADL's:  Intact   Cognition: WNL  Sleep:  Number of Hours: 6.75   Treatment Plan Summary:Demarqus Jenita SeashoreCrater is a 40 y.o. Caucasian male, who has a hx of depression, psychosis as well as substance abuse ( cocaine, cannabis , opioid on maintnenace)  presented voluntarily for  c/o paranoia, AH w/command, SA and emotional distress.Pt today continues to be anxious ,has AH , and is suicidal .Pt to be continued on treatment.    Daily contact with patient to assess and evaluate symptoms and progress in treatment and Medication management   Reviewed past medical records,treatment plan.  Increased Prozac to 60 mg po daily for affective sx. Will increase Abilify to 15 mg po qhs for psychosis. Will continue Cogentin 0.5 mg po qhs for EPS. Will continue Trazodone  150 mg po qhs for sleep. Will continue Ditropan XL 5 mg po bid for overactive bladder. Will continue Subutex 8 mg SL BID for opioid abuse . Will continue Zofran 8 mg po q8h prn for nausea. Restarted home medications where indicated. Will continue to monitor vitals ,medication compliance and treatment side effects while patient is here.  Will monitor for medical issues as well as call consult as needed.  Reviewed labs -  Hba1c- 5.3 , tsh- wnl , lipid panel- elevated triglycerides -  dietician consult , ekg - qtc - wnl. CSW will continue working on disposition. Pt to be referred to a substance abuse program. Patient to participate in therapeutic milieu .      Junice Fei, MD 11/08/2015, 3:11 PM

## 2015-11-08 NOTE — Plan of Care (Signed)
Problem: Alteration in mood Goal: STG-Patient reports thoughts of self-harm to staff Outcome: Adequate for Discharge [name] denies SI to Clinical research associatewriter and on daily inventory sheet.

## 2015-11-08 NOTE — Progress Notes (Addendum)
[name] was complaining to the tech, [name], about being watched taking his Subutex.  He stated that he is going to get staff in trouble because he believes that the writer was trying to say that he was cheeking his medication to get him in trouble.  He then proceeded to say "I know how to get staff in trouble.  The last time I was here staff took the percocet that I was supposed to get and I got them fired."  Notified charge nurse about the above and [name] in charge about his behavior while taking the Subutex.  Pt stated that he wants to leave tonight and explained to him that he will need to talk with the doctor about this tomorrow.

## 2015-11-08 NOTE — Tx Team (Signed)
Interdisciplinary Treatment Plan Update (Adult)  Date:  11/08/2015   Time Reviewed:  3:57 PM   Progress in Treatment: Attending groups: Yes. Participating in groups:  Yes. Taking medication as prescribed:  Yes. Tolerating medication:  Yes. Family/Significant other contact made:  No Patient understands diagnosis:  Yes  States he needs help with addiction, AH Discussing patient identified problems/goals with staff:  Yes, see initial care plan. Medical problems stabilized or resolved:  Yes. Denies suicidal/homicidal ideation: Yes. Issues/concerns per patient self-inventory:  No. Other:  New problem(s) identified:  Discharge Plan or Barriers: see below  Reason for Continuation of Hospitalization: Depression  Medication stabilization   Comments:  Pt today continues to be anxious ,has AH , and is suicidal Increased Prozac to 60 mg po daily for affective sx. Will increase Abilify to 15 mg po qhs for psychosis. Will continue Cogentin 0.5 mg po qhs for EPS. Will continue Trazodone 150 mg po qhs for sleep.  Estimated length of stay: Likely d/c Sat  New goal(s):  Review of initial/current patient goals per problem list:   Review of initial/current patient goals per problem list:  1. Goal(s): Patient will participate in aftercare plan   Met: Yes   Target date: 3-5 days post admission date   As evidenced by: Patient will participate within aftercare plan AEB aftercare provider and housing plan at discharge being identified. 11/05/15:  Despite being told there are no structured programs other than outpatient for folks on controlled medications, patient is insistent he needs to go into a program from here.Has been kicked out of his parents home after living there all his life. 11/08/15:  Today shares that he is accepted back at parents home.  Does not want info sent to [facility] "because they would find out I relapsed, and that would jeopardize my situation."  Asked for  referral to psychiatrist.    2. Goal (s): Patient will exhibit decreased depressive symptoms and suicidal ideations.   Met: No   Target date: 3-5 days post admission date   As evidenced by: Patient will utilize self rating of depression at 3 or below and demonstrate decreased signs of depression or be deemed stable for discharge by MD. 11/05/15:  Rates his depression a 7 today 11/08/15:  Rates depression a 6 today  4. Goal(s): Patient will demonstrate decreased signs of withdrawal due to substance abuse   Met: Yes   Target date: 3-5 days post admission date   As evidenced by: Patient will produce a CIWA/COWS score of 0, have stable vitals signs, and no symptoms of withdrawal 11/05/15:  No signs nor symptoms of withdrawal     5. Goal(s): Patient will demonstrate decreased signs of psychosis  * Met: Progressing  * Target date: 3-5 days post admission date  * As evidenced by: Patient will demonstrate decreased frequency of AVH or return to baseline function 11/05/15:  Pt c/o AH telling him to use drugs, and paranoia 11/08/15:  C/O voices, but they are no longer command in nature          Attendees: Patient:  11/08/2015 3:57 PM   Family:   11/08/2015 3:57 PM   Physician:  Ursula Alert, MD 11/08/2015 3:57 PM   Nursing:   Manuella Ghazi, RN 11/08/2015 3:57 PM   CSW:    Roque Lias, [place]   11/08/2015 3:57 PM   Other:  11/08/2015 3:57 PM   Other:   11/08/2015 3:57 PM   Other:  Lars Pinks, Nurse CM 11/08/2015 3:57  PM   Other:   11/08/2015 3:57 PM   Other:  Norberto Sorenson, [place]  11/08/2015 3:57 PM   Other:  11/08/2015 3:57 PM   Other:  11/08/2015 3:57 PM   Other:  11/08/2015 3:57 PM   Other:  11/08/2015 3:57 PM   Other:  11/08/2015 3:57 PM   Other:   11/08/2015 3:57 PM    Scribe for Treatment Team:   Trish Mage, 11/08/2015 3:57 PM

## 2015-11-09 MED ORDER — TRAZODONE HCL 150 MG PO TABS
150.0000 mg | ORAL_TABLET | Freq: Every day | ORAL | Status: DC
  Administered 2015-11-09: 150 mg via ORAL
  Filled 2015-11-09 (×4): qty 1

## 2015-11-09 NOTE — Progress Notes (Signed)
[facility] MD Progress Note  11/09/2015 10:28 AM [name]  MRN:  [id number] Subjective:  Pt states " I did not sleep all that well last night.'     Objective:[name] is a 40 y.o. Caucasian male, who has a hx of depression, psychosis as well as substance abuse ( cocaine, cannabis , opioid on maintnenace)  presented voluntarily for  c/o paranoia, AH w/command, SA and emotional distress.   Patient seen and chart reviewed.Discussed patient with treatment team.  Pt today continues to progress , his AH is improving. Pt however struggles with sleep - per staff did not take his trazodone last night . Pt is more visible in milieu. Pt denies ADRs.   Principal Problem: MDD (major depressive disorder), recurrent, severe, with psychosis (HCC)  Diagnosis:   Patient Active Problem List   Diagnosis Date Noted  . Cocaine use disorder, moderate, dependence (HCC) [F14.20] 11/05/2015  . Cannabis use disorder, moderate, dependence (HCC) [F12.20] 11/05/2015  . Attention deficit hyperactivity disorder (ADHD) [F90.9] 11/05/2015  . Opioid use disorder, moderate, in early remission, on maintenance therapy [F11.90] 11/05/2015  . MDD (major depressive disorder), recurrent, severe, with psychosis (HCC) [F33.3] 11/05/2015   Total Time spent with patient: 20 minutes  Past Psychiatric History: as per H&P.  Past Medical History:  Past Medical History  Diagnosis Date  . BPH (benign prostatic hyperplasia)   . Anxiety   . Depression   . ADHD (attention deficit hyperactivity disorder)   . Abdominal pain, recurrent     chronic lower abdominal pain  . Narcotic abuse     opiates  . Chronic pain   . Hypertension     Past Surgical History  Procedure Laterality Date  . Appendectomy  08/2005   Family History:  Family History  Problem Relation Age of Onset  . Hypertension Father   . Prostate cancer Other   . Mental illness Maternal Aunt   . Drug abuse Maternal Aunt    Family Psychiatric  History: As  per H&P. Social History: As per H&P. History  Alcohol Use No    Comment: occasion     History  Drug Use  . Yes  . Special: Oxycodone, Opium, Cocaine    Social History   Social History  . Marital Status: Single    Spouse Name: N/A  . Number of Children: N/A  . Years of Education: N/A   Social History Main Topics  . Smoking status: Former Smoker -- 0.50 packs/day    Types: Cigarettes    Quit date: 03/10/2014  . Smokeless tobacco: Never Used  . Alcohol Use: No     Comment: occasion  . Drug Use: Yes    Special: Oxycodone, Opium, Cocaine  . Sexual Activity: Yes   Other Topics Concern  . None   Social History Narrative   Additional Social History:    Pain Medications: None Reported Prescriptions: Pt reports non-compliance with Ritalin, Prozac & Neurotin- pt reports he last received Suboxone from [facility] on 5.5.17 Over the Counter: None Reported History of alcohol / drug use?: Yes Longest period of sobriety (when/how long): 5-6 months Negative Consequences of Use: Personal relationships Withdrawal Symptoms: Nausea / Vomiting Name of Substance 1: Cannabis 1 - Age of First Use: 16 1 - Amount (size/oz): "as much as I can get" 1 - Frequency: daily for the past 2 weeks 1 - Duration: ongoing 1 - Last Use / Amount: Not Reported Name of Substance 2: Opiates 2 - Age of First Use: 32  2 - Amount (size/oz): "as much as I can get" 2 - Frequency: daily 2 - Duration: Ongoing 2 - Last Use / Amount: 1 pill yesterday, prior to that no use for 5-6 months Name of Substance 3: Cocaine 3 - Age of First Use: 21 3 - Amount (size/oz): $100 worth 3 - Frequency: daily 3 - Duration: daily for the past 2 weeks 3 - Last Use / Amount: Not Reported              Sleep: Poor  Appetite:  Fair  Current Medications: Current Facility-Administered Medications  Medication Dose Route Frequency Provider Last Rate Last Dose  . acetaminophen (TYLENOL) tablet 650 mg  650 mg Oral Q6H  PRN Court Joy, PA-C   650 mg at 11/08/15 1541  . ARIPiprazole (ABILIFY) tablet 15 mg  15 mg Oral QHS Jomarie Longs, MD   15 mg at 11/08/15 2150  . benztropine (COGENTIN) tablet 0.5 mg  0.5 mg Oral QHS Karizma Cheek, MD   0.5 mg at 11/08/15 2150  . buprenorphine (SUBUTEX) sublingual tablet 8 mg  8 mg Sublingual BID Rachael Fee, MD   8 mg at 11/09/15 0830  . feeding supplement (BOOST / RESOURCE BREEZE) liquid 1 Container  1 Container Oral BID BM Jomarie Longs, MD   1 Container at 11/09/15 0904  . FLUoxetine (PROZAC) capsule 60 mg  60 mg Oral Daily Jomarie Longs, MD   60 mg at 11/09/15 0828  . magnesium hydroxide (MILK OF MAGNESIA) suspension 30 mL  30 mL Oral Daily PRN Court Joy, PA-C      . methylphenidate (RITALIN) tablet 20 mg  20 mg Oral BID WC Rachael Fee, MD   20 mg at 11/09/15 0827  . nicotine (NICODERM CQ - dosed in mg/24 hours) patch 21 mg  21 mg Transdermal Daily Rachael Fee, MD   21 mg at 11/09/15 0830  . ondansetron (ZOFRAN-ODT) disintegrating tablet 8 mg  8 mg Oral Q8H PRN Jomarie Longs, MD   8 mg at 11/08/15 1425  . oxybutynin (DITROPAN-XL) 24 hr tablet 5 mg  5 mg Oral BID Jomarie Longs, MD   5 mg at 11/09/15 0828  . traZODone (DESYREL) tablet 150 mg  150 mg Oral QHS Jomarie Longs, MD        Lab Results:  No results found for this or any previous visit (from the past 48 hour(s)).  Blood Alcohol level:  Lab Results  Component Value Date   [facility] <5 11/03/2015   ETH <11 06/21/2012    Physical Findings: AIMS: Facial and Oral Movements Muscles of Facial Expression: None, normal Lips and Perioral Area: None, normal Jaw: None, normal Tongue: None, normal,Extremity Movements Upper (arms, wrists, hands, fingers): None, normal Lower (legs, knees, ankles, toes): None, normal, Trunk Movements Neck, shoulders, hips: None, normal, Overall Severity Severity of abnormal movements (highest score from questions above): None, normal Incapacitation due to abnormal  movements: None, normal Patient's awareness of abnormal movements (rate only patient's report): No Awareness, Dental Status Current problems with teeth and/or dentures?: No Does patient usually wear dentures?: No  CIWA:  CIWA-Ar Total: 2 COWS:  COWS Total Score: 4  Musculoskeletal: Strength & Muscle Tone: within normal limits Gait & Station: normal Patient leans: N/A  Psychiatric Specialty Exam: Review of Systems  Psychiatric/Behavioral: Positive for depression, hallucinations (loud music) and substance abuse. The patient is nervous/anxious and has insomnia.   All other systems reviewed and are negative.   Blood pressure 92/56,  pulse 85, temperature 97.8 F (36.6 C), temperature source Oral, resp. rate 16, height 5\' 8"  (1.727 m), weight 64.864 kg (143 lb).Body mass index is 21.75 kg/(m^2).  General Appearance: Casual  Eye Contact::  Fair  Speech:  Normal Rate  Volume:  Normal  Mood:  Anxious and Depressed  Affect:  Congruent  Thought Process:  Coherent  Orientation:  Full (Time, Place, and Person)  Thought Content:  Delusions, Hallucinations: Auditory, Paranoid Ideation and Rumination improving  Suicidal Thoughts:  No  Homicidal Thoughts:  No  Memory:  Immediate;   Fair Recent;   Fair Remote;   Fair  Judgement:  Impaired  Insight:  Shallow  Psychomotor Activity:  Normal  Concentration:  Fair  Recall:  [employer]Fair  Fund of Knowledge:Fair  Language: Fair  Akathisia:  No  Handed:  Right  AIMS (if indicated):     Assets:  Desire for Improvement  ADL's:  Intact  Cognition: WNL  Sleep:  Number of Hours: 6.75   Treatment Plan Summary:[name] is a 40 y.o. Caucasian male, who has a hx of depression, psychosis as well as substance abuse ( cocaine, cannabis , opioid on maintnenace)  presented voluntarily for  c/o paranoia, AH w/command, SA and emotional distress.Pt today continues to be anxious ,has AH ,and has sleep issues .Pt to be continued on treatment.    Daily contact  with patient to assess and evaluate symptoms and progress in treatment and Medication management   Reviewed past medical records,treatment plan.  Increased Prozac to 60 mg po daily for affective sx. Will continue  Abilify 15 mg po qhs for psychosis. Will continue Cogentin 0.5 mg po qhs for EPS. Will continue Trazodone  150 mg po qhs for sleep.Will schedule it. Will continue Ditropan XL 5 mg po bid for overactive bladder. Will continue Subutex 8 mg SL BID for opioid abuse . Will continue Zofran 8 mg po q8h prn for nausea. Restarted home medications where indicated. Will continue to monitor vitals ,medication compliance and treatment side effects while patient is here.  Will monitor for medical issues as well as call consult as needed.  Reviewed labs -  Hba1c- 5.3 , tsh- wnl , lipid panel- elevated triglycerides -  dietician consult , ekg - qtc - wnl. CSW will continue working on disposition. Pt to be referred to a substance abuse program. Patient to participate in therapeutic milieu .      Lakena Sparlin, MD 11/09/2015, 10:28 AM

## 2015-11-09 NOTE — [facility] Group Notes (Signed)
Adult Psychoeducational Group Note  Date:  11/09/2015 Time:  9:05 PM  Group Topic/Focus:  Wrap-Up Group:   The focus of this group is to help patients review their daily goal of treatment and discuss progress on daily workbooks.  Participation Level:  Minimal  Participation Quality:  Appropriate  Affect:  Appropriate  Cognitive:  Appropriate  Insight: Good  Engagement in Group:  Limited  Modes of Intervention:  Discussion  Additional Comments:  Pt rated his day a 5.  Pt stated his day was so, so.  His goal was to shave and my discharge tomorrow.  Caroll RancherLindsay, Braycen Burandt A 11/09/2015, 9:05 PM

## 2015-11-09 NOTE — Progress Notes (Signed)
Recreation Therapy Notes  05.12.2017 LRT returned with resources for patient. Patient given information on [employer] [employer]. Golf course is low cost and accessible to patient. Patient educated about resources produced, asked no questions and continues to appear ambiguous about receiving resources. Patient expressed no additional concerns at this time.   Marykay Lexenise L Cainen Burnham, LRT/CTRS         Jearl KlinefelterBlanchfield, Arif Amendola L 11/09/2015 3:40 PM

## 2015-11-09 NOTE — Progress Notes (Signed)
D: Pt was observed pacing and restless, endorses moderate depression and anxiety; states, "I'm frustrated; did not have a good day" Pt also endorses passive SI however, contracts for safety; states, "the thoughts comes and goes." Pt also endorses mild pain in the inner part of his lower lip from h/o tobacco dipping. Denies HI or AVH. A: Medications offered as prescribed.  Support, encouragement, and safe environment provided.  15-minute safety checks continue. R: Pt was med compliant.  Pt attended karaoke group. Safety checks continue.

## 2015-11-09 NOTE — Progress Notes (Signed)
D: Patient denies SI/HI and A/V hallucinations; patient reports sleep is poor; patient reported he did not sleep well; Clinical research associatewriter informed him that he has medication available if its needed;  reports appetite is poor but the patient is drinking the prescribed boost; patient reports eating minimally during meals ; reports energy level is low; patient reports that he has felt sleepy all day ; reports ability to concentrate is poor; rates depression as 5/10; rates hopelessness 5/10; rates anxiety as 5/10;   A: Monitored q 15 minutes; patient encouraged to attend groups; patient educated about medications; patient given medications per physician orders; patient encouraged to express feelings and/or concerns  R: Patient is minimal with staff and peers; patient is cooperative; patient has been pleasant; patient was able to set goal to talk with staff 1:1 when having feelings of SI; patient is taking medications as prescribed and tolerating medications; patient is attending some groups

## 2015-11-09 NOTE — Progress Notes (Signed)
  [facility] :  Will you be returning to the same living situation after discharge:  Yes,  home At discharge, do you have transportation home?: Yes,  family Do you have the ability to pay for your medications: Yes,  MCD  Release of information consent forms completed and in the chart;  Patient's signature needed at discharge.  Patient to Follow up at: Follow-up Information    Follow up with [facility]  On 11/13/2015.   Why:  Go on Tuesday at 12:00 for your hospital follow up appointment.   Contact information:   [address]  [address] [place] [phone]      Next level of care provider has [facility] to [facility] Link:no  Safety Planning and Suicide Prevention discussed: Yes,  yes  Have you used any form of tobacco in the last 30 days? (Cigarettes, Smokeless Tobacco, Cigars, and/or Pipes): Yes  Has patient been referred to the Quitline?: Patient refused referral "I only smoke when I am drinking."  Patient has been referred for addiction treatment: Yes  [name], [name] 11/09/2015, 2:12 PM

## 2015-11-09 NOTE — [facility] Suicide Risk Assessment (Signed)
[facility] INPATIENT:  Family/Significant Other Suicide Prevention Education  Suicide Prevention Education:  Patient Refusal for Family/Significant Other Suicide Prevention Education: The patient [name] has refused to provide written consent for family/significant other to be provided Family/Significant Other Suicide Prevention Education during admission and/or prior to discharge.  Physician notified.  Daryel Geraldorth, Shamon Cothran B 11/09/2015, 2:15 PM

## 2015-11-09 NOTE — [facility] Group Notes (Signed)
[facility] LCSW Group Therapy  11/09/2015  1:05 PM  Type of Therapy:  Group therapy  Participation Level:  Active  Participation Quality:  Attentive  Affect:  Flat  Cognitive:  Oriented  Insight:  Limited  Engagement in Therapy:  Limited  Modes of Intervention:  Discussion, Socialization  Summary of Progress/Problems:  Chaplain was here to lead a group on themes of hope and courage. "Every day I pray the Shaune PollackLord will deliver me from doing childish things." Talked about wanting to leave drugs behind.  Also referred to the intense experience of asking his parents to let him come back home again, and was relieved to learn that they would allow it. "My mother is my payee. That's a good thing. But I lied to her, and I feel bad for that. [name], [name] 11/09/2015 1:22 PM

## 2015-11-10 DIAGNOSIS — F900 Attention-deficit hyperactivity disorder, predominantly inattentive type: Secondary | ICD-10-CM

## 2015-11-10 DIAGNOSIS — Z79899 Other long term (current) drug therapy: Secondary | ICD-10-CM

## 2015-11-10 MED ORDER — OXYBUTYNIN CHLORIDE ER 5 MG PO TB24: 5.0000 mg | ORAL_TABLET | Freq: Two times a day (BID) | ORAL | Status: DC

## 2015-11-10 MED ORDER — METHYLPHENIDATE HCL 10 MG PO TABS: 20.0000 mg | ORAL_TABLET | Freq: Two times a day (BID) | ORAL | Status: DC

## 2015-11-10 MED ORDER — BENZTROPINE MESYLATE 0.5 MG PO TABS
0.5000 mg | ORAL_TABLET | Freq: Every day | ORAL | Status: DC
Start: 2015-11-10 — End: 2017-06-05

## 2015-11-10 MED ORDER — FLUOXETINE HCL 20 MG PO CAPS: 60.0000 mg | ORAL_CAPSULE | Freq: Every day | ORAL | Status: DC

## 2015-11-10 MED ORDER — TRAZODONE HCL 150 MG PO TABS: 150.0000 mg | ORAL_TABLET | Freq: Every day | ORAL | Status: DC

## 2015-11-10 MED ORDER — ARIPIPRAZOLE 15 MG PO TABS: 15.0000 mg | ORAL_TABLET | Freq: Every day | ORAL | Status: DC

## 2015-11-10 NOTE — [facility] Group Notes (Signed)
[facility] Group Notes: (Clinical Social Work)   11/10/2015      Type of Therapy:  Group Therapy   Participation Level:  Did Not Attend despite MHT prompting   Pegi Milazzo Grossman-Orr, LCSW 11/10/2015, 12:28 PM     

## 2015-11-10 NOTE — Progress Notes (Signed)
D: Pt was observed pacing the hall; endorses moderate depression and anxiety; states, "I have been feeling this way for ever; some days are just worse that other." Pt also endorses SI however, contracts for safety; states, "I felt like doing something earlier but I am better now." Denies HI, pain or AVH. A: Medications offered as prescribed.  Support, encouragement, and safe environment provided.  15-minute safety checks continue. R: Pt was med compliant.  Pt attended wrap-up group. Safety checks continue.

## 2015-11-10 NOTE — [facility] Suicide Risk Assessment (Signed)
[facility]   Principal Problem: MDD (major depressive disorder), recurrent, severe, with psychosis (HCC) Discharge Diagnoses:  Patient Active Problem List   Diagnosis Date Noted  . Cocaine use disorder, moderate, dependence (HCC) [F14.20] 11/05/2015  . Cannabis use disorder, moderate, dependence (HCC) [F12.20] 11/05/2015  . Attention deficit hyperactivity disorder (ADHD) [F90.9] 11/05/2015  . Opioid use disorder, moderate, in early remission, on maintenance therapy [F11.90] 11/05/2015  . MDD (major depressive disorder), recurrent, severe, with psychosis (HCC) [F33.3] 11/05/2015    Total Time spent with patient: 45 minutes  Musculoskeletal: Strength & Muscle Tone: within normal limits Gait & Station: normal Patient leans: no lean  Psychiatric Specialty Exam: Review of Systems  Constitutional: Negative for fever.  Cardiovascular: Negative for chest pain.  Skin: Negative for rash.  Psychiatric/Behavioral: Negative for suicidal ideas.    Blood pressure 92/54, pulse 86, temperature 97.3 F (36.3 C), temperature source Oral, resp. rate 20, height 5\' 8"  (1.727 m), weight 64.864 kg (143 lb).Body mass index is 21.75 kg/(m^2).  General Appearance: Casual  Eye Contact::  Fair  Speech:  Normal Rate409  Volume:  Normal  Mood:  Euthymic  Affect:  Congruent  Thought Process:  Coherent  Orientation:  Full (Time, Place, and Person)  Thought Content:  Rumination  Suicidal Thoughts:  No  Homicidal Thoughts:  No  Memory:  Immediate;   Fair Recent;   Fair  Judgement:  Fair  Insight:  Present  Psychomotor Activity:  Normal  Concentration:  Fair  Recall:  FiservFair  Fund of Knowledge:Fair  Language: Fair  Akathisia:  Negative  Handed:  Right  AIMS (if indicated):     Assets:  Desire for Improvement  Sleep:  Number of Hours: 6.5  Cognition: WNL  ADL's:  Intact   Mental Status Per Nursing Assessment::   On Admission:  Suicidal ideation indicated by  patient  Demographic Factors:  Male  Loss Factors: Financial problems/change in socioeconomic status  Historical Factors: Impulsivity  Risk Reduction Factors:   Positive coping skills or problem solving skills  Continued Clinical Symptoms:  Dysthymia Alcohol/Substance Abuse/Dependencies  Cognitive Features That Contribute To Risk:  None    Suicide Risk:  Minimal: No identifiable suicidal ideation.  Patients presenting with no risk factors but with morbid ruminations; may be classified as minimal risk based on the severity of the depressive symptoms  Follow-up Information    Follow up with [facility]  On 11/13/2015.   Why:  Go on Tuesday at 12:00 for your hospital follow up appointment.   Contact information:   [address]  [address] [place] [phone]     Stable and improved insight. Will follow with SA referrals and encourage compliance with meds and appointments. Plan Of Care/Follow-up recommendations:  Activity:  regular Diet:  regular  Thresa RossAKHTAR, Doris Gruhn, MD 11/10/2015, 9:57 AM

## 2015-11-10 NOTE — Progress Notes (Signed)
Patient ID: [name], male   DOB: 26-Feb-1976, 40 y.o.   MRN: [id number]   Pt was discharged home with his father. Pt reported that he was ready for discharged, and that he was glad that he received information on rehab programs from the SW. Pt reported that he was negative SI/HI, no AH/VH noted. Pt was given his discharge instructions and paperwork. No other issues or concerns noted.

## 2015-11-10 NOTE — Progress Notes (Signed)
Patient ID: Darrow Bussinghomas Lansky, male   DOB: 03-10-1976, [age] y.o.   MRN: [id number]  Clinical Social Work Note  Pt asking for samples of medications at d/c.  CSW informed him that hospital policy is to supply samples only where insurance is not available to a patient.  He has Medicaid, states he has 10 medications and with a $3 co-pay, does not have enough money left after rent and groceries for the medicines.  He does have a Lawyerepresentative Payee and was advised to talk to that person about prioritizing his mental health and medical needs.  Pt also wanted information about potential follow-up for addiction issues, and CSW provided him with numbers of [facility], [facility], [facility] and [facility].  Ambrose MantleMareida Grossman-Orr, LCSW 11/10/2015, 9:44 AM

## 2015-11-10 NOTE — Discharge Summary (Signed)
Physician Discharge Summary Note  Patient:  [name] is an 40 y.o., male MRN:  [id number] DOB:  1976-03-06 Patient phone:  [phone] (home)  Patient address:   [address]  [place] [address],  Total Time spent with patient: 1 hour  Date of Admission:  11/03/2015 Date of Discharge: 11/10/2015  Reason for Admission: History of Present Illness:PER [facility] Assessment Note-[name] is an 40 y.o. male. Presenting voluntarily for assessments with c/o paranoia, AH w/command, SA and emotional distress. Pt reports h/o anxiety w/ panic attacks, depression and ADD. Pt reports panic attacks have recently increased in frequency and now occur multiple times a week. Pt reports experiencing 2 attacks on yesterday (5.5.17). Pt reports that he is followed by [facility] ([facility]). Pt reports non-compliance with prescribed Neurontin. Pt reports h/o opiate use and that he currently receives Suboxone from [facility]. Pt reports daily use of Cannabis and Cocaine for the past two weeks. Pt reports that he has not slept or eaten x4days.   On Evaluation: [name] is awake, alert and oriented X3, found resting in bed. Denies suicidal or homicidal ideation at this time. Denies auditory or visual hallucination and does not appear to be responding to internal stimuli. Patient reports hx of auditory hallucination while using drugs. Patient reports information provided in assessment is validate. States his depression 8/10. Support, encouragement and reassurance was provided.   Associated Signs/Symptoms: Depression Symptoms: feelings of worthlessness/guilt, difficulty concentrating, hopelessness, suicidal thoughts without plan, anxiety, disturbed sleep, weight loss, (Hypo) Manic Symptoms: Distractibility, Hallucinations, Impulsivity, Irritable Mood, Anxiety Symptoms: Excessive Worry, Psychotic Symptoms: Hallucinations: Auditory PTSD Symptoms: Avoidance: Foreshortened Future Total Time  spent with patient: 45 minutes  Past Psychiatric History: See Above  Is the patient at risk to self? Yes.   Has the patient been a risk to self in the past 6 months? Yes.   Has the patient been a risk to self within the distant past? No.  Is the patient a risk to others? No.  Has the patient been a risk to others in the past 6 months? No.  Has the patient been a risk to others within the distant past? No.   Prior Inpatient Therapy: Prior Inpatient Therapy: Yes Prior Therapy Dates: Multiple, last admission 2009 Prior Therapy Facilty/Provider(s): [facility] Reason for Treatment: "I can't remember, probably the same thing" Prior Outpatient Therapy: Prior Outpatient Therapy: Yes Prior Therapy Dates: Ongoing Prior Therapy Facilty/Provider(s): [facility] Reason for Treatment: Depression, Anxiety Does patient have an ACCT team?: No Does patient have Intensive In-House Services? : No Does patient have [facility] services? : No Does patient have P4CC services?: No  Alcohol Screening: 1. How often do you have a drink containing alcohol?: Never 9. Have you or someone else been injured as a result of your drinking?: No 10. Has a relative or friend or a doctor or another health worker been concerned about your drinking or suggested you cut down?: No Alcohol Use Disorder Identification Test Final Score (AUDIT): 0 Brief Intervention: AUDIT score less than 7 or less-screening does not suggest unhealthy drinking-brief intervention not indicated Substance Abuse History in the last 12 months: Yes.  Consequences of Substance Abuse: Withdrawal Symptoms: Cramps Headaches Nausea Tremors Previous Psychotropic Medications: Yes Psychological Evaluations: Yes  Principal Problem: MDD (major depressive disorder), recurrent, severe, with psychosis [facility]) Discharge Diagnoses: Patient Active Problem List   Diagnosis Date Noted  . Cocaine use disorder, moderate, dependence (HCC) [F14.20] 11/05/2015  .  Cannabis use disorder, moderate, dependence (HCC) [F12.20]  11/05/2015  . Attention deficit hyperactivity disorder (ADHD) [F90.9] 11/05/2015  . Opioid use disorder, moderate, in early remission, on maintenance therapy [F11.90] 11/05/2015  . MDD (major depressive disorder), recurrent, severe, with psychosis (HCC) [F33.3] 11/05/2015   Past Medical History:  Past Medical History  Diagnosis Date  . BPH (benign prostatic hyperplasia)   . Anxiety   . Depression   . ADHD (attention deficit hyperactivity disorder)   . Abdominal pain, recurrent     chronic lower abdominal pain  . Narcotic abuse     opiates  . Chronic pain   . Hypertension     Past Surgical History  Procedure Laterality Date  . Appendectomy  08/2005   Family History:  Family History  Problem Relation Age of Onset  . Hypertension Father   . Prostate cancer Other   . Mental illness Maternal Aunt   . Drug abuse Maternal Aunt   Social History:  History  Alcohol Use No    Comment: occasion     History  Drug Use  . Yes  . Special: Oxycodone, Opium, Cocaine    Social History   Social History  . Marital Status: Single    Spouse Name: N/A  . Number of Children: N/A  . Years of Education: N/A   Social History Main Topics  . Smoking status: Former Smoker -- 0.50 packs/day    Types: Cigarettes    Quit date: 03/10/2014  . Smokeless tobacco: Never Used  . Alcohol Use: No     Comment: occasion  . Drug Use: Yes    Special: Oxycodone, Opium, Cocaine  . Sexual Activity: Yes   Other Topics Concern  . None   Social History Narrative    Hospital Course: 40 Y/O male who states he is having auditory hallucinations, suicidal attempt with paranoia. He has an extensive history of persistent addiction. States he has been depressed, suicidal for a long time. He is currently being prescribed Suboxone from [facility]. Medical problems were identified and treated as needed.  Home medications were restarted as  appropriate.  [name] was admitted to the hospital with his UDS reports positive for multiple substances that included;THC and Cocaine. UDS was negative for Opiates and Amphetamine.  He was also enrolled in the group counseling sessions and AA/NA meetings being offered and held on this unit. He learned coping skills.  Besides the detoxification and medication  treatments, [name] was medicated with Abilify 15mg  po qhs for psychosis and depression, Cogentin 0.5mg  po qhs to reduce EPS symptoms, and Trazodone 150 mg Q bedtime for sleep. His home medications consisted of Ritalin 20mg  po BID, which was negative in UDS, and Prozac 40mg  which was increased to 60mg  po daily for depression and anxiety.  He presented no other medical issues that required treatment and or monitoring. He tolerated his treatment regimen without any significant adverse effects and or reactions.   [name] has completed treatment and his mood is stable. This is evidenced by his reports of improved mood and absence of mood labilty. He is currently being discharged to continue substance abuse treatment, medication management and routine psychiatric care, at the [facility], here in [place], [place]. Upon discharge, he adamantly denies any SIHI, AVH, delusional thoughts, paranoia and or withdrawal symptoms. He left [facility] with all personal belongings in no apparent distress. He received 14 days worth supply prescription of his [facility] discharge medications. Transportation per bus pass. Improvement was monitored by observation and Stephen Bussing daily report of symptom  reduction.  Emotional and mental status was monitored by daily self-inventory reports completed by [name] Glaze and clinical staff.         [name] was evaluated by the treatment team for stability and plans for continued recovery upon discharge.  [name] motivation was an integral factor for scheduling further treatment.  Employment, transportation, bed  availability, health status, family support, and any pending legal issues were also considered during his hospital stay.  He was offered further treatment options upon discharge including but not limited to Residential, Intensive Outpatient, and Outpatient treatment.  [name] will follow up with the services as listed below under Follow Up Information.     Physical Findings: AIMS: Facial and Oral Movements Muscles of Facial Expression: None, normal Lips and Perioral Area: None, normal Jaw: None, normal Tongue: None, normal,Extremity Movements Upper (arms, wrists, hands, fingers): None, normal Lower (legs, knees, ankles, toes): None, normal, Trunk Movements Neck, shoulders, hips: None, normal, Overall Severity Severity of abnormal movements (highest score from questions above): None, normal Incapacitation due to abnormal movements: None, normal Patient's awareness of abnormal movements (rate only patient's report): No Awareness, Dental Status Current problems with teeth and/or dentures?: No Does patient usually wear dentures?: No  CIWA:  CIWA-Ar Total: 2 COWS:  COWS Total Score: 4  Musculoskeletal: Strength & Muscle Tone: within normal limits Gait & Station: normal Patient leans: N/A  Psychiatric Specialty Exam: See MD SRA ROS  Blood pressure 92/54, pulse 86, temperature 97.3 F (36.3 C), temperature source Oral, resp. rate 20, height 5\' 8"  (1.727 m), weight 64.864 kg (143 lb).Body mass index is 21.75 kg/(m^2).   Have you used any form of tobacco in the last 30 days? (Cigarettes, Smokeless Tobacco, Cigars, and/or Pipes): Yes  Has this patient used any form of tobacco in the last 30 days? (Cigarettes, Smokeless Tobacco, Cigars, and/or Pipes)  Yes, A prescription for an FDA-approved tobacco cessation medication was offered at discharge and the patient refused  Blood Alcohol level:  Lab Results  Component Value Date   [facility] 11/03/2015   ETH <11 06/21/2012    Metabolic  Disorder Labs:  Lab Results  Component Value Date   HGBA1C 5.3 11/06/2015   MPG 105 11/06/2015   No results found for: PROLACTIN Lab Results  Component Value Date   CHOL 177 11/06/2015   TRIG 320* 11/06/2015   HDL 34* 11/06/2015   CHOLHDL 5.2 11/06/2015   VLDL 64* 11/06/2015   LDLCALC 79 11/06/2015    See Psychiatric Specialty Exam and Suicide Risk Assessment completed by Attending Physician prior to discharge.  Discharge destination:  Home  Is patient on multiple antipsychotic therapies at discharge:  No   Has Patient had three or more failed trials of antipsychotic monotherapy by history:  No  Recommended Plan for Multiple Antipsychotic Therapies: NA  Discharge Instructions    Discharge instructions    Complete by:  As directed   Discharge Recommendations:  The patient is being discharged to his family. Patient is to take his discharge medications as ordered. See follow up below. We recommend that he participate in individual therapy to target depressive symptoms and improving coping skills. We recommend that he participate infamily therapy to target the conflict with his family , and improving communication skills and conflict resolution skills. Family is to initiate/implement a contingency based behavioral model to address patient's behavior. The patient should abstain from all illicit substances, alcohol, and peer pressure. If the patient's symptoms worsen or do not continue  to improve or if the patient becomes actively suicidal or homicidal then it is recommended that the patient return to the closest hospital emergency room or call 911 for further evaluation and treatment. National Suicide Prevention Lifeline 1800-SUICIDE or 786-583-1521. Also recommend referral to a substance abuse program.  Please follow up with your primary medical doctor for all other medical needs.   The patient has been educated on the possible side effects to medications and he/his guardian  is to contact a medical professional and inform outpatient provider of any new side effects of medication. He is to take regular diet and activity as tolerated.  Family was educated about removing/locking any firearms, medications or dangerous products from the home.            Medication List    STOP taking these medications        cyclobenzaprine 5 MG tablet  Commonly known as:  FLEXERIL      TAKE these medications      Indication   ARIPiprazole 15 MG tablet  Commonly known as:  ABILIFY  Take 1 tablet (15 mg total) by mouth at bedtime.      aspirin EC 81 MG tablet  Take 81 mg by mouth at bedtime.      benztropine 0.5 MG tablet  Commonly known as:  COGENTIN  Take 1 tablet (0.5 mg total) by mouth at bedtime.      FLUoxetine 20 MG capsule  Commonly known as:  PROZAC  Take 3 capsules (60 mg total) by mouth daily.   Indication:  Major Depressive Disorder     gabapentin 300 MG capsule  Commonly known as:  NEURONTIN  Take 300 mg by mouth 3 (three) times daily.      methylphenidate 10 MG tablet  Commonly known as:  RITALIN  Take 2 tablets (20 mg total) by mouth 2 (two) times daily with breakfast and lunch.      multivitamin tablet  Take 1 tablet by mouth daily.      oxybutynin 5 MG 24 hr tablet  Commonly known as:  DITROPAN-XL  Take 1 tablet (5 mg total) by mouth 2 (two) times daily.      SUBOXONE 8-2 MG Film  Generic drug:  Buprenorphine HCl-Naloxone HCl  Take 1 Film by mouth 3 (three) times daily.      traZODone 150 MG tablet  Commonly known as:  DESYREL  Take 1 tablet (150 mg total) by mouth at bedtime.   Indication:  Trouble Sleeping           Follow-up Information    Follow up with [facility]  On 11/13/2015.   Why:  Go on Tuesday at 12:00 for your hospital follow up appointment.   Contact information:   [address]  [address] [place] [[phone]      Follow-up recommendations:  Activity:  Increase activity as tolerated.  Diet:   Regualr house diet  Comments: Take all medications as prescribed. Keep all follow-up appointments as scheduled.  Do not consume alcohol or use illegal drugs while on prescription medications. Report any adverse effects from your medications to your primary care provider promptly.  In the event of recurrent symptoms or worsening symptoms, call 911, a crisis hotline, or go to the nearest emergency department for evaluation.   Signed: Truman Hayward, FNP 11/10/2015, 9:23 AM  I have examined the patient and agree with the discharge plan and findings. I have also done suicide assessment on  this patient.

## 2015-11-17 ENCOUNTER — Emergency Department (HOSPITAL_COMMUNITY)
Admission: EM | Admit: 2015-11-17 | Discharge: 2015-11-18 | Disposition: A | Discharge status: Home / Self Care | Payer: Medicaid Other | Attending: Emergency Medicine | Admitting: Emergency Medicine

## 2015-11-17 ENCOUNTER — Encounter (HOSPITAL_COMMUNITY): Payer: Self-pay | Admitting: Emergency Medicine

## 2015-11-17 ENCOUNTER — Ambulatory Visit (HOSPITAL_COMMUNITY)
Admission: AD | Admit: 2015-11-17 | Discharge: 2015-11-17 | Disposition: A | Discharge status: Home / Self Care | Payer: Medicaid Other | Source: Intra-hospital | Attending: Psychiatry | Admitting: Psychiatry

## 2015-11-17 DIAGNOSIS — F11288 Opioid dependence with other opioid-induced disorder: Secondary | ICD-10-CM | POA: Insufficient documentation

## 2015-11-17 DIAGNOSIS — F12288 Cannabis dependence with other cannabis-induced disorder: Secondary | ICD-10-CM | POA: Insufficient documentation

## 2015-11-17 DIAGNOSIS — R45851 Suicidal ideations: Secondary | ICD-10-CM | POA: Insufficient documentation

## 2015-11-17 DIAGNOSIS — I10 Essential (primary) hypertension: Secondary | ICD-10-CM | POA: Diagnosis not present

## 2015-11-17 DIAGNOSIS — N400 Enlarged prostate without lower urinary tract symptoms: Secondary | ICD-10-CM | POA: Diagnosis not present

## 2015-11-17 DIAGNOSIS — Z87891 Personal history of nicotine dependence: Secondary | ICD-10-CM | POA: Diagnosis not present

## 2015-11-17 DIAGNOSIS — F14288 Cocaine dependence with other cocaine-induced disorder: Secondary | ICD-10-CM | POA: Diagnosis not present

## 2015-11-17 DIAGNOSIS — Z7982 Long term (current) use of aspirin: Secondary | ICD-10-CM | POA: Diagnosis not present

## 2015-11-17 DIAGNOSIS — F332 Major depressive disorder, recurrent severe without psychotic features: Secondary | ICD-10-CM | POA: Insufficient documentation

## 2015-11-17 DIAGNOSIS — F329 Major depressive disorder, single episode, unspecified: Secondary | ICD-10-CM | POA: Diagnosis not present

## 2015-11-17 DIAGNOSIS — F909 Attention-deficit hyperactivity disorder, unspecified type: Secondary | ICD-10-CM | POA: Diagnosis not present

## 2015-11-17 DIAGNOSIS — Z8042 Family history of malignant neoplasm of prostate: Secondary | ICD-10-CM | POA: Diagnosis not present

## 2015-11-17 DIAGNOSIS — Z818 Family history of other mental and behavioral disorders: Secondary | ICD-10-CM | POA: Insufficient documentation

## 2015-11-17 DIAGNOSIS — F1420 Cocaine dependence, uncomplicated: Secondary | ICD-10-CM

## 2015-11-17 DIAGNOSIS — Z8249 Family history of ischemic heart disease and other diseases of the circulatory system: Secondary | ICD-10-CM | POA: Diagnosis not present

## 2015-11-17 DIAGNOSIS — Z813 Family history of other psychoactive substance abuse and dependence: Secondary | ICD-10-CM | POA: Diagnosis not present

## 2015-11-17 DIAGNOSIS — F419 Anxiety disorder, unspecified: Secondary | ICD-10-CM | POA: Insufficient documentation

## 2015-11-17 LAB — CBC
HEMATOCRIT: 42.4 % (ref 39.0–52.0)
HEMOGLOBIN: 13.9 g/dL (ref 13.0–17.0)
MCH: 30.9 pg (ref 26.0–34.0)
MCHC: 32.8 g/dL (ref 30.0–36.0)
MCV: 94.2 fL (ref 78.0–100.0)
PLATELETS: 239 10*3/uL (ref 150–400)
RBC: 4.5 MIL/uL (ref 4.22–5.81)
RDW: 13.1 % (ref 11.5–15.5)
WBC: 10.3 10*3/uL (ref 4.0–10.5)

## 2015-11-17 LAB — COMPREHENSIVE METABOLIC PANEL
ALBUMIN: 4.3 g/dL (ref 3.5–5.0)
ALT: 16 U/L — AB (ref 17–63)
AST: 17 U/L (ref 15–41)
Alkaline Phosphatase: 42 U/L (ref 38–126)
Anion gap: 5 (ref 5–15)
BILIRUBIN TOTAL: 0.4 mg/dL (ref 0.3–1.2)
BUN: 13 mg/dL (ref 6–20)
CHLORIDE: 107 mmol/L (ref 101–111)
CO2: 27 mmol/L (ref 22–32)
CREATININE: 0.82 mg/dL (ref 0.61–1.24)
Calcium: 8.7 mg/dL — ABNORMAL LOW (ref 8.9–10.3)
GFR calc Af Amer: >60 mL/min (ref >60)
GLUCOSE: 119 mg/dL — AB (ref 65–99)
Potassium: 3.6 mmol/L (ref 3.5–5.1)
Sodium: 139 mmol/L (ref 135–145)
TOTAL PROTEIN: 6.4 g/dL — AB (ref 6.5–8.1)

## 2015-11-17 LAB — ETHANOL

## 2015-11-17 MED ORDER — TRAZODONE HCL 50 MG PO TABS
150.0000 mg | ORAL_TABLET | Freq: Every day | ORAL | Status: DC
  Administered 2015-11-17: 150 mg via ORAL
  Filled 2015-11-17: qty 1

## 2015-11-17 MED ORDER — IBUPROFEN 200 MG PO TABS: 600.0000 mg | ORAL_TABLET | Freq: Three times a day (TID) | ORAL | Status: DC | PRN

## 2015-11-17 MED ORDER — BENZTROPINE MESYLATE 1 MG PO TABS
0.5000 mg | ORAL_TABLET | Freq: Every day | ORAL | Status: DC
  Administered 2015-11-17: 0.5 mg via ORAL
  Filled 2015-11-17: qty 1

## 2015-11-17 MED ORDER — ASPIRIN EC 81 MG PO TBEC
81.0000 mg | DELAYED_RELEASE_TABLET | Freq: Every day | ORAL | Status: DC
  Administered 2015-11-17: 81 mg via ORAL
  Filled 2015-11-17 (×3): qty 1

## 2015-11-17 MED ORDER — GABAPENTIN 300 MG PO CAPS
300.0000 mg | ORAL_CAPSULE | Freq: Three times a day (TID) | ORAL | Status: DC
  Administered 2015-11-17 – 2015-11-18 (×3): 300 mg via ORAL
  Filled 2015-11-17 (×3): qty 1

## 2015-11-17 MED ORDER — ACETAMINOPHEN 325 MG PO TABS: 650.0000 mg | ORAL_TABLET | ORAL | Status: DC | PRN

## 2015-11-17 MED ORDER — LORAZEPAM 1 MG PO TABS: 1.0000 mg | ORAL_TABLET | Freq: Three times a day (TID) | ORAL | Status: DC | PRN

## 2015-11-17 MED ORDER — FLUOXETINE HCL 20 MG PO CAPS
60.0000 mg | ORAL_CAPSULE | Freq: Every day | ORAL | Status: DC
  Administered 2015-11-17 – 2015-11-18 (×2): 60 mg via ORAL
  Filled 2015-11-17 (×2): qty 3

## 2015-11-17 MED ORDER — BUPRENORPHINE HCL 8 MG SL SUBL
8.0000 mg | SUBLINGUAL_TABLET | Freq: Three times a day (TID) | SUBLINGUAL | Status: DC
  Administered 2015-11-17 – 2015-11-18 (×3): 8 mg via SUBLINGUAL
  Filled 2015-11-17 (×3): qty 1

## 2015-11-17 MED ORDER — NICOTINE 21 MG/24HR TD PT24
21.0000 mg | MEDICATED_PATCH | Freq: Every day | TRANSDERMAL | Status: DC
  Administered 2015-11-17 – 2015-11-18 (×2): 21 mg via TRANSDERMAL
  Filled 2015-11-17 (×2): qty 1

## 2015-11-17 MED ORDER — ARIPIPRAZOLE 15 MG PO TABS
15.0000 mg | ORAL_TABLET | Freq: Every day | ORAL | Status: DC
  Administered 2015-11-17: 15 mg via ORAL
  Filled 2015-11-17 (×3): qty 1

## 2015-11-17 MED ORDER — ONDANSETRON HCL 4 MG PO TABS
4.0000 mg | ORAL_TABLET | Freq: Three times a day (TID) | ORAL | Status: DC | PRN
Start: 2015-11-17 — End: 2015-11-18

## 2015-11-17 MED ORDER — METHYLPHENIDATE HCL 5 MG PO TABS
20.0000 mg | ORAL_TABLET | Freq: Two times a day (BID) | ORAL | Status: DC
  Administered 2015-11-18: 20 mg via ORAL
  Filled 2015-11-17: qty 4

## 2015-11-17 NOTE — ED Notes (Signed)
Bed: WBH35 Expected date:  Expected time:  Means of arrival:  Comments: TR3 

## 2015-11-17 NOTE — ED Notes (Signed)
Patient noted sleeping in room. No complaints, stable, in no acute distress. Q15 minute rounds and monitoring via Security Cameras to continue.  

## 2015-11-17 NOTE — ED Notes (Signed)
Report from Edie Marvin RN. Patient sleeping, respirations regular and unlabored. Q15 minute rounds and security camera observation to continue.    

## 2015-11-17 NOTE — ED Notes (Signed)
Pt oriented to room and unit.  Patient contracts for safety.  He is pleasant and cooperative.  15 minute checks and video monitoring in place.

## 2015-11-17 NOTE — BH Assessment (Signed)
Assessment Note  [name] is an 40 y.o. male. [facility] because of suicidal thoughts with a plan to shoot self, auditory hallucinations with commands, hopelessness, and worthlessness.  Patient reports constantly hearing carnival music and voices that are belittling plus encouraging him to kill self.  Patient reports current suicidal thoughts with plan and access to a gun. "Blow my head off".  Patient reports feeling worthless, hopeless, lack of daily purpose, and no supports at this time.  "I feel like a loser compared to my family".  Patient reports daily panic attacks.  [facility] and has had five previous visits.  Patient is currently participating with [facility] for the past year and is on 24mg  daily.     This Clinical research associatewriter consulted with Vernona RiegerLaura, NP it is [facility] and AM psych evaluation.    Diagnosis: Major Depressive Disorder, recurrent, severe  Past Medical History:  Past Medical History  Diagnosis Date  . BPH (benign prostatic hyperplasia)   . Anxiety   . Depression   . ADHD (attention deficit hyperactivity disorder)   . Abdominal pain, recurrent     chronic lower abdominal pain  . Narcotic abuse     opiates  . Chronic pain   . Hypertension     Past Surgical History  Procedure Laterality Date  . Appendectomy  08/2005    Family History:  Family History  Problem Relation Age of Onset  . Hypertension Father   . Prostate cancer Other   . Mental illness Maternal Aunt   . Drug abuse Maternal Aunt     Social History:  reports that he quit smoking about 20 months ago. His smoking use included Cigarettes. He smoked 0.50 packs per day. He has never used smokeless tobacco. He reports that he uses illicit drugs (Oxycodone, Opium, and Cocaine). He reports that he does not drink alcohol.  Additional Social History:  Alcohol / Drug Use Pain Medications: see  chart Prescriptions: see chart Over the Counter: see chart History of alcohol / drug use?: Yes Longest period of sobriety (when/how long): couple months Negative Consequences of Use: Financial, Personal relationships, Work / Programmer, multimediachool Withdrawal Symptoms:  (none at this time) Substance #1 Name of Substance 1: Cocaine 1 - Last Use / Amount: couple weeks ago Substance #2 Name of Substance 2: Cannabis 2 - Last Use / Amount: couple weeks Substance #3 Name of Substance 3: Opiates 3 - Last Use / Amount: year ago   CIWA: CIWA-Ar Nausea and Vomiting: no nausea and no vomiting Tactile Disturbances: none Tremor: no tremor Auditory Disturbances: not present Paroxysmal Sweats: no sweat visible Visual Disturbances: not present Anxiety: no anxiety, at ease Headache, Fullness in Head: none present Agitation: normal activity Orientation and Clouding of Sensorium: oriented and can do serial additions CIWA-Ar Total: 0 COWS: Clinical Opiate Withdrawal Scale (COWS) Resting Pulse Rate: Pulse Rate 80 or below Sweating: No report of chills or flushing Restlessness: Able to sit still Pupil Size: Pupils pinned or normal size for room light Bone or Joint Aches: Not present Runny Nose or Tearing: Not present GI Upset: No GI symptoms Tremor: No tremor Yawning: No yawning Anxiety or Irritability: None Gooseflesh Skin: Skin is smooth COWS Total Score: 0  Allergies:  Allergies  Allergen Reactions  . Escitalopram Oxalate Hives and Swelling  . Contrast Media [Iodinated Diagnostic Agents] Nausea And Vomiting    Home Medications:  (Not in a hospital  admission)  OB/GYN Status:  No LMP for male patient.  General Assessment Data Location of Assessment: [facility] Assessment Services TTS Assessment: In system Is this a Tele or Face-to-Face Assessment?: Face-to-Face Is this an Initial Assessment or a Re-assessment for this encounter?: Initial Assessment Marital status: Single Is patient pregnant?:  No Pregnancy Status: No Living Arrangements: Parent Can pt return to current living arrangement?: Yes Admission Status: Voluntary Is patient capable of signing voluntary admission?: Yes Referral Source: Self/Family/Friend     Crisis Care Plan Living Arrangements: Parent [facility]  Education Status Is patient currently in school?: No Highest grade of school patient has completed: 12th  Risk to self with the past 6 months Suicidal Ideation: Yes-Currently Present Has patient been a risk to self within the past 6 months prior to admission? : Yes Suicidal Intent: Yes-Currently Present Has patient had any suicidal intent within the past 6 months prior to admission? : Yes Is patient at risk for suicide?: Yes Suicidal Plan?: Yes-Currently Present Has patient had any suicidal plan within the past 6 months prior to admission? : Yes Specify Current Suicidal Plan: shoot self Access to Means: Yes Specify Access to Suicidal Means: gun at parents home What has been your use of drugs/alcohol within the last 12 months?: Cocaine, THC, Opiates (Pt denies use in the past couple weeks) Previous Attempts/Gestures: Yes How many times?: 1 Other Self Harm Risks: no Triggers for Past Attempts: Family contact, Other personal contacts Intentional Self Injurious Behavior: None Family Suicide History: Yes (Aunt committed suicide) Recent stressful life event(s): Conflict (Comment), Loss (Comment), Financial Problems, Other (Comment) Persecutory voices/beliefs?: No Depression: Yes Depression Symptoms: Despondent, Isolating, Fatigue, Guilt, Loss of interest in usual pleasures, Feeling worthless/self pity, Feeling angry/irritable (hopelessness) Substance abuse history and/or treatment for substance abuse?: Yes  Risk to Others within the past 6 months Homicidal Ideation: No-Not Currently/Within Last 6 Months Does patient have  any lifetime risk of violence toward others beyond the six months prior to admission? : No Thoughts of Harm to Others: No-Not Currently Present/Within Last 6 Months Current Homicidal Intent: No-Not Currently/Within Last 6 Months Current Homicidal Plan: No-Not Currently/Within Last 6 Months Access to Homicidal Means: No History of harm to others?: No Assessment of Violence: None Noted Violent Behavior Description: none noted Does patient have access to weapons?: Yes (Comment) Criminal Charges Pending?: No Does patient have a court date: No Is patient on probation?: No  Psychosis Hallucinations: Auditory, With command Delusions: None noted  Mental Status Report Appearance/Hygiene: Unremarkable Eye Contact: Fair Motor Activity: Freedom of movement Speech: Logical/coherent Level of Consciousness: Alert Mood: Anxious, Depressed Affect: Anxious, Depressed Anxiety Level: Minimal Thought Processes: Relevant Judgement: Impaired Orientation: Person, Place, Time, Situation, Appropriate for developmental age Obsessive Compulsive Thoughts/Behaviors: None  Cognitive Functioning Concentration: Fair Memory: Recent Intact, Remote Intact IQ: Average Insight: Fair Impulse Control: Fair Appetite: Poor Sleep: Decreased Total Hours of Sleep: 3 Vegetative Symptoms: None  ADLScreening [facility] Assessment Services) Patient's cognitive ability adequate to safely complete daily activities?: Yes Patient able to express need for assistance with ADLs?: Yes Independently performs ADLs?: Yes (appropriate for developmental age)  Prior Inpatient Therapy Prior Inpatient Therapy: Yes Prior Therapy Dates: Multiple, last admission 2009 Prior Therapy Facilty/Provider(s): [facility] Reason for Treatment: "I can't remember, probably the same thing"  Prior Outpatient Therapy Prior Outpatient Therapy: Yes Prior Therapy Dates: Ongoing Prior Therapy Facilty[facility] Reason for Treatment:  Depression, Anxiety Does patient have an ACCT  team?: No Does patient have Intensive In-House Services?  : No Does patient have [facility] services? : No Does patient have P4CC services?: No  ADL Screening (condition at time of admission) Patient's cognitive ability adequate to safely complete daily activities?: Yes Patient able to express need for assistance with ADLs?: Yes Independently performs ADLs?: Yes (appropriate for developmental age)       Abuse/Neglect Assessment (Assessment to be complete while patient is alone) Physical Abuse: Denies Verbal Abuse: Denies Sexual Abuse: Denies Exploitation of patient/patient's resources: Denies Self-Neglect: Denies Values / Beliefs Cultural Requests During Hospitalization: None Spiritual Requests During Hospitalization: None Consults Spiritual Care Consult Needed: No Social Work Consult Needed: No      Additional Information 1:1 In Past 12 Months?: No CIRT Risk: No Elopement Risk: No Does patient have medical clearance?: No     Disposition:  Disposition Initial Assessment Completed for this Encounter: Yes Disposition of Patient: Other dispositions (Re-evaluation in the AM)  On Site Evaluation by:   Reviewed with Physician:    Maryelizabeth Rowan A 11/17/2015 1:57 PM

## 2015-11-17 NOTE — ED Notes (Signed)
[facility] and will return. Pt reports withdrawal symptoms from suboxone.

## 2015-11-17 NOTE — ED Provider Notes (Signed)
CSN: [id number]     Arrival date & time 11/17/15  1346 History   First MD Initiated Contact with Patient 11/17/15 1439     Chief Complaint  Patient presents with  . Medical Clearance     (Consider location/radiation/quality/duration/timing/severity/associated sxs/prior Treatment) HPI Comments: Patient here complaining of suicidal ideations. Recent admission to behavioral Hospital for same and also had substance abuse and has been on Suboxone. Went to behavior health today and was sent her for medical clearance. Patient denies any somatic complaints at this time. Denies responding to internal stimuli. No current ingestion of Tylenol or aspirin. No recent alcohol use.  The history is provided by the patient.    Past Medical History  Diagnosis Date  . BPH (benign prostatic hyperplasia)   . Anxiety   . Depression   . ADHD (attention deficit hyperactivity disorder)   . Abdominal pain, recurrent     chronic lower abdominal pain  . Narcotic abuse     opiates  . Chronic pain   . Hypertension    Past Surgical History  Procedure Laterality Date  . Appendectomy  08/2005   Family History  Problem Relation Age of Onset  . Hypertension Father   . Prostate cancer Other   . Mental illness Maternal Aunt   . Drug abuse Maternal Aunt    Social History  Substance Use Topics  . Smoking status: Former Smoker -- 0.50 packs/day    Types: Cigarettes    Quit date: 03/10/2014  . Smokeless tobacco: Never Used  . Alcohol Use: No     Comment: occasion    Review of Systems  All other systems reviewed and are negative.     Allergies  Escitalopram oxalate and Contrast media  Home Medications   Prior to Admission medications   Medication Sig Start Date End Date Taking? Authorizing Provider  ARIPiprazole (ABILIFY) 15 MG tablet Take 1 tablet (15 mg total) by mouth at bedtime. 11/10/15   Truman Haywardakia S Starkes, FNP  aspirin EC 81 MG tablet Take 81 mg by mouth at bedtime.     Historical Provider, MD   benztropine (COGENTIN) 0.5 MG tablet Take 1 tablet (0.5 mg total) by mouth at bedtime. 11/10/15   Truman Haywardakia S Starkes, FNP  FLUoxetine (PROZAC) 20 MG capsule Take 3 capsules (60 mg total) by mouth daily. 11/10/15   Truman Haywardakia S Starkes, FNP  gabapentin (NEURONTIN) 300 MG capsule Take 300 mg by mouth 3 (three) times daily.    Historical Provider, MD  methylphenidate (RITALIN) 10 MG tablet Take 2 tablets (20 mg total) by mouth 2 (two) times daily with breakfast and lunch. 11/10/15   Truman Haywardakia S Starkes, FNP  Multiple Vitamin (MULTIVITAMIN) tablet Take 1 tablet by mouth daily.    Historical Provider, MD  oxybutynin (DITROPAN-XL) 5 MG 24 hr tablet Take 1 tablet (5 mg total) by mouth 2 (two) times daily. 11/10/15   Truman Haywardakia S Starkes, FNP  SUBOXONE 8-2 MG FILM Take 1 Film by mouth 3 (three) times daily.  10/29/15   Historical Provider, MD  traZODone (DESYREL) 150 MG tablet Take 1 tablet (150 mg total) by mouth at bedtime. 11/10/15   Truman Haywardakia S Starkes, FNP   BP 108/74 mmHg  Pulse 80  Temp(Src) 98.1 F (36.7 C) (Oral)  Resp 16  SpO2 96% Physical Exam  Constitutional: He is oriented to person, place, and time. He appears well-developed and well-nourished.  Non-toxic appearance. No distress.  HENT:  Head: Normocephalic and atraumatic.  Eyes: Conjunctivae, EOM and lids  are normal. Pupils are equal, round, and reactive to light.  Neck: Normal range of motion. Neck supple. No tracheal deviation present. No thyroid mass present.  Cardiovascular: Normal rate, regular rhythm and normal heart sounds.  Exam reveals no gallop.   No murmur heard. Pulmonary/Chest: Effort normal and breath sounds normal. No stridor. No respiratory distress. He has no decreased breath sounds. He has no wheezes. He has no rhonchi. He has no rales.  Abdominal: Soft. Normal appearance and bowel sounds are normal. He exhibits no distension. There is no tenderness. There is no rebound and no CVA tenderness.  Musculoskeletal: Normal range of motion. He  exhibits no edema or tenderness.  Neurological: He is alert and oriented to person, place, and time. He has normal strength. No cranial nerve deficit or sensory deficit. GCS eye subscore is 4. GCS verbal subscore is 5. GCS motor subscore is 6.  Skin: Skin is warm and dry. No abrasion and no rash noted.  Psychiatric: He has a normal mood and affect. His speech is normal and behavior is normal. He expresses suicidal ideation. He expresses suicidal plans.  Nursing note and vitals reviewed.   ED Course  Procedures (including critical care time) Labs Review Labs Reviewed  COMPREHENSIVE METABOLIC PANEL  ETHANOL  CBC  URINE RAPID DRUG SCREEN, HOSP PERFORMED    Imaging Review No results found. I have personally reviewed and evaluated these images and lab results as part of my medical decision-making.   EKG Interpretation None      MDM   Final diagnoses:  None    Patient will be medically cleared and then disposition by behavior health    Lorre Nick, MD 11/17/15 1458

## 2015-11-18 DIAGNOSIS — F909 Attention-deficit hyperactivity disorder, unspecified type: Secondary | ICD-10-CM

## 2015-11-18 NOTE — Clinical Social Work Note (Signed)
CSW met with pt to discuss discharge needs.  Per psychiatry pt is being discharged home today.  CSW was asked to call his family to discuss any possibility of a gun at home and to make sure family is aware to keep guns locked up.  CSW obtained a written release from pt to speak to both his parents.  CSW called and spoke with pt's mother to provide discharge and discussed guns in the home. Both pt and his parents stated that the guns are locked up in a safe and pt cannot access.  Pt reported that he sees Dr. Trula Ore for his suboxene and Dr. Jonelle Sidle for his ritalin.  Pt reported that he has these medications at home.  Pt's mother reported that she and his father had an appointment at 1pm and asked to have pt sent home with a bus pass stating that he could wait for them on the porch until they got home as pt does not have a key to the home.  Pt and RN provided with this information and bus pass given to RN to provide at discharge.  Pt's mother also provided with the [facility] 800 number to access any further services including inpatient treatment if needed.   Dede Query, LCSW [facility] Worker - Weekend Coverage cell #: [phone]

## 2015-11-18 NOTE — ED Notes (Signed)
Patient noted sleeping in room. No complaints, stable, in no acute distress. Q15 minute rounds and monitoring via Security Cameras to continue.  

## 2015-11-18 NOTE — Consult Note (Signed)
[facility] Face-to-Face Psychiatry Consult   Reason for Consult:  Suicide ideation, Auditory hallucinations. Referring Physician:  EDP Patient Identification: [name] MRN:  [id number] Principal Diagnosis: Attention deficit hyperactivity disorder (ADHD) Diagnosis:   Patient Active Problem List   Diagnosis Date Noted  . Attention deficit hyperactivity disorder (ADHD) [F90.9] 11/05/2015    Priority: High  . Cocaine use disorder, moderate, dependence ([place]) [F14.20] 11/05/2015  . Cannabis use disorder, moderate, dependence ([place]) [F12.20] 11/05/2015  . Opioid use disorder, moderate, in early remission, on maintenance therapy [F11.90] 11/05/2015  . MDD (major depressive disorder), recurrent, severe, with psychosis ([place]) [F33.3] 11/05/2015    Total Time spent with patient: 45 minutes  Subjective:   [name] is a 40 y.o. male patient admitted with Suicide ideation, Auditory hallucinations.Marland Kitchen  HPI:  Caucasian male, 40 years old was evaluated for suicide ideation and auditory hallucinations with voices telling him to kill himself.  Patient was discharged exactly 7 days ago from our [facility] after stabilization.   Patient, today denies SI/HI/AVH.  Patient was informed he could not receive his Suboxone and Ritalin in the unit so he decided to leave.  Patient stated that he reported auditory hallucination and suicide in other to get admitted to receive suboxone and Ritalin.  Patient vehemently denies hearing voices or feeling suicidal.  He will seek appointment from the two doctors who prescribes Suboxone and Ritalin for him.  He stays with his parents who are supportive and SW staff spoke with the mother.  Patient is discharged.  Past Psychiatric History:  MDD, Opioid use disorder, Moderate in Remission and in maintenance  Therapy, Cocaine use disorder, moderate dependence, ADHD  Risk to Self:   Risk to Others:   Prior Inpatient Therapy:   Prior Outpatient Therapy:    Past Medical History:  Past  Medical History  Diagnosis Date  . BPH (benign prostatic hyperplasia)   . Anxiety   . Depression   . ADHD (attention deficit hyperactivity disorder)   . Abdominal pain, recurrent     chronic lower abdominal pain  . Narcotic abuse     opiates  . Chronic pain   . Hypertension     Past Surgical History  Procedure Laterality Date  . Appendectomy  08/2005   Family History:  Family History  Problem Relation Age of Onset  . Hypertension Father   . Prostate cancer Other   . Mental illness Maternal Aunt   . Drug abuse Maternal Aunt    Family Psychiatric  History:  Denies Social History:  History  Alcohol Use No    Comment: occasion     History  Drug Use  . Yes  . Special: Oxycodone, Opium, Cocaine    Social History   Social History  . Marital Status: Single    Spouse Name: N/A  . Number of Children: N/A  . Years of Education: N/A   Social History Main Topics  . Smoking status: Former Smoker -- 0.50 packs/day    Types: Cigarettes    Quit date: 03/10/2014  . Smokeless tobacco: Never Used  . Alcohol Use: No     Comment: occasion  . Drug Use: Yes    Special: Oxycodone, Opium, Cocaine  . Sexual Activity: Yes   Other Topics Concern  . None   Social History Narrative   Additional Social History:    Allergies:   Allergies  Allergen Reactions  . Escitalopram Oxalate Hives and Swelling  . Contrast Media [Iodinated Diagnostic Agents] Nausea And Vomiting  Labs:  Results for orders placed or performed during the hospital encounter of 11/17/15 (from the past 48 hour(s))  Comprehensive metabolic panel     Status: Abnormal   Collection Time: 11/17/15  2:32 PM  Result Value Ref Range   Sodium 139 135 - 145 mmol/L   Potassium 3.6 3.5 - 5.1 mmol/L   Chloride 107 101 - 111 mmol/L   CO2 27 22 - 32 mmol/L   Glucose, Bld 119 (H) 65 - 99 mg/dL   BUN 13 6 - 20 mg/dL   Creatinine, Ser 0.82 0.61 - 1.24 mg/dL   Calcium 8.7 (L) 8.9 - 10.3 mg/dL   Total Protein 6.4 (L)  6.5 - 8.1 g/dL   Albumin 4.3 3.5 - 5.0 g/dL   AST 17 15 - 41 U/L   ALT 16 (L) 17 - 63 U/L   Alkaline Phosphatase 42 38 - 126 U/L   Total Bilirubin 0.4 0.3 - 1.2 mg/dL   GFR calc non Af Amer >60 >60 mL/min   GFR calc Af Amer >60 >60 mL/min    Comment: (NOTE) The eGFR has been calculated using the CKD EPI equation. This calculation has not been validated in all clinical situations. eGFR's persistently <60 mL/min signify possible Chronic Kidney Disease.    Anion gap 5 5 - 15  Ethanol     Status: None   Collection Time: 11/17/15  2:32 PM  Result Value Ref Range   Alcohol, Ethyl (B) <5 <5 mg/dL    Comment:        LOWEST DETECTABLE LIMIT FOR SERUM ALCOHOL IS 5 mg/dL FOR MEDICAL PURPOSES ONLY   cbc     Status: None   Collection Time: 11/17/15  2:32 PM  Result Value Ref Range   WBC 10.3 4.0 - 10.5 K/uL   RBC 4.50 4.22 - 5.81 MIL/uL   Hemoglobin 13.9 13.0 - 17.0 g/dL   HCT 42.4 39.0 - 52.0 %   MCV 94.2 78.0 - 100.0 fL   MCH 30.9 26.0 - 34.0 pg   MCHC 32.8 30.0 - 36.0 g/dL   RDW 13.1 11.5 - 15.5 %   Platelets 239 150 - 400 K/uL    Current Facility-Administered Medications  Medication Dose Route Frequency Provider Last Rate Last Dose  . acetaminophen (TYLENOL) tablet 650 mg  650 mg Oral Q4H PRN Lacretia Leigh, MD      . ARIPiprazole (ABILIFY) tablet 15 mg  15 mg Oral QHS Lacretia Leigh, MD   15 mg at 11/17/15 2113  . aspirin EC tablet 81 mg  81 mg Oral QHS Lacretia Leigh, MD   81 mg at 11/17/15 2112  . benztropine (COGENTIN) tablet 0.5 mg  0.5 mg Oral QHS Lacretia Leigh, MD   0.5 mg at 11/17/15 2112  . buprenorphine (SUBUTEX) sublingual tablet 8 mg  8 mg Sublingual TID Lacretia Leigh, MD   8 mg at 11/18/15 0919  . FLUoxetine (PROZAC) capsule 60 mg  60 mg Oral Daily Lacretia Leigh, MD   60 mg at 11/18/15 0917  . gabapentin (NEURONTIN) capsule 300 mg  300 mg Oral TID Lacretia Leigh, MD   300 mg at 11/18/15 0917  . ibuprofen (ADVIL,MOTRIN) tablet 600 mg  600 mg Oral Q8H PRN Lacretia Leigh,  MD      . LORazepam (ATIVAN) tablet 1 mg  1 mg Oral Q8H PRN Lacretia Leigh, MD      . methylphenidate (RITALIN) tablet 20 mg  20 mg Oral BID WC Lacretia Leigh, MD  20 mg at 11/18/15 0900  . nicotine (NICODERM CQ - dosed in mg/24 hours) patch 21 mg  21 mg Transdermal Daily Lacretia Leigh, MD   21 mg at 11/18/15 0920  . ondansetron (ZOFRAN) tablet 4 mg  4 mg Oral Q8H PRN Lacretia Leigh, MD      . traZODone (DESYREL) tablet 150 mg  150 mg Oral QHS Lacretia Leigh, MD   150 mg at 11/17/15 2113   Current Outpatient Prescriptions  Medication Sig Dispense Refill  . ARIPiprazole (ABILIFY) 15 MG tablet Take 1 tablet (15 mg total) by mouth at bedtime. 14 tablet 0  . aspirin EC 81 MG tablet Take 81 mg by mouth at bedtime.     . benztropine (COGENTIN) 0.5 MG tablet Take 1 tablet (0.5 mg total) by mouth at bedtime. 14 tablet 0  . FLUoxetine (PROZAC) 20 MG capsule Take 3 capsules (60 mg total) by mouth daily. 14 capsule 0  . methylphenidate (RITALIN) 10 MG tablet Take 2 tablets (20 mg total) by mouth 2 (two) times daily with breakfast and lunch. 28 tablet 0  . Multiple Vitamin (MULTIVITAMIN) tablet Take 1 tablet by mouth daily.    Marland Kitchen oxybutynin (DITROPAN-XL) 5 MG 24 hr tablet Take 1 tablet (5 mg total) by mouth 2 (two) times daily. 28 tablet 0  . SUBOXONE 8-2 MG FILM Take 1 Film by mouth 3 (three) times daily.   0  . traZODone (DESYREL) 150 MG tablet Take 1 tablet (150 mg total) by mouth at bedtime. 14 tablet 0    Musculoskeletal: Strength & Muscle Tone: within normal limits Gait & Station: normal Patient leans: N/A  Psychiatric Specialty Exam: Review of Systems  Constitutional: Negative.   HENT: Negative.   Eyes: Negative.   Respiratory: Negative.   Cardiovascular: Negative.   Gastrointestinal: Negative.   Genitourinary: Negative.   Musculoskeletal: Negative.   Skin: Negative.   Neurological: Negative.   Endo/Heme/Allergies: Negative.     Blood pressure 102/58, pulse 70, temperature 98 F (36.7  C), temperature source Oral, resp. rate 16, SpO2 97 %.There is no weight on file to calculate BMI.  General Appearance: Casual and Fairly Groomed  Engineer, water::  Good  Speech:  Clear and Coherent and Normal Rate  Volume:  Normal  Mood:  Euthymic  Affect:  Congruent  Thought Process:  Coherent and Goal Directed  Orientation:  Full (Time, Place, and Person)  Thought Content:  WDL  Suicidal Thoughts:  No  Homicidal Thoughts:  No  Memory:  Immediate;   Good Recent;   Good Remote;   Good  Judgement:  Good  Insight:  Good  Psychomotor Activity:  Normal  Concentration:  Good  Recall:  Good  Fund of Knowledge:Good  Language: Good  Akathisia:  No  Handed:  Right  AIMS (if indicated):     Assets:  Desire for Improvement  ADL's:  Intact  Cognition: WNL  Sleep:       Disposition:  Discharge home, follow up with your Primary care and [place] providers as scheduled.  Delfin Gant, NP   PMHNP-BC 11/18/2015 1:10 PM  Patient seen face to face for psychiatric evaluation with staff RN and physician extender. Formulated treatmetn plan and reviewed the information documented and agree with the treatment plan.  Tadd Holtmeyer,JANARDHAHA R. 11/18/2015 3:04 PM

## 2015-11-18 NOTE — [facility] Suicide Risk Assessment (Cosign Needed)
Suicide Risk Assessment  Discharge Assessment   [facility]   Principal Problem: Attention deficit hyperactivity disorder (ADHD) Discharge Diagnoses:  Patient Active Problem List   Diagnosis Date Noted  . Attention deficit hyperactivity disorder (ADHD) [F90.9] 11/05/2015    Priority: High  . Cocaine use disorder, moderate, dependence ([facility]) [F14.20] 11/05/2015  . Cannabis use disorder, moderate, dependence ([facility]) [F12.20] 11/05/2015  . Opioid use disorder, moderate, in early remission, on maintenance therapy [F11.90] 11/05/2015  . MDD (major depressive disorder), recurrent, severe, with psychosis ([facility]) [F33.3] 11/05/2015    Total Time spent with patient: 20 minutes  Musculoskeletal: Strength & Muscle Tone: within normal limits Gait & Station: normal Patient leans: N/A  Psychiatric Specialty Exam:   Blood pressure 102/58, pulse 70, temperature 98 F (36.7 C), temperature source Oral, resp. rate 16, SpO2 97 %.There is no weight on file to calculate BMI.  General Appearance: Casual and Fairly Groomed  Patent attorneyye Contact:: Good  Speech: Clear and Coherent and Normal Rate  Volume: Normal  Mood: Euthymic  Affect: Congruent  Thought Process: Coherent and Goal Directed  Orientation: Full (Time, Place, and Person)  Thought Content: WDL  Suicidal Thoughts: No  Homicidal Thoughts: No  Memory: Immediate; Good Recent; Good Remote; Good  Judgement: Good  Insight: Good  Psychomotor Activity: Normal  Concentration: Good  Recall: Good  Fund of Knowledge:Good  Language: Good  Akathisia: No  Handed: Right  AIMS (if indicated):    Assets: Desire for Improvement  ADL's: Intact  Cognition: WNL         Mental Status Per Nursing Assessment::   On Admission:     Demographic Factors:  Male, Caucasian and Unemployed  Loss Factors: NA  Historical Factors: NA  Risk Reduction Factors:   Living with another  person, especially a relative  Continued Clinical Symptoms:  Severe Anxiety and/or Agitation Alcohol/Substance Abuse/Dependencies  Cognitive Features That Contribute To Risk:  Polarized thinking    Suicide Risk:  Minimal: No identifiable suicidal ideation.  Patients presenting with no risk factors but with morbid ruminations; may be classified as minimal risk based on the severity of the depressive symptoms  Follow-up Information    Follow up with you have stated you have medications at home.   Contact information:   follow up as needed with Dr. Will BonnetPlumer for suboxene treatment since he is already prescribing this medication and Dr. Mikeal HawthorneGarba for your ritalin since you have stated he is already prescribing this medication      Plan Of Care/Follow-up recommendations:  Activity:  As tolerated Diet:  Regular  Stephen NavyJosephine C Strother Everitt, NP   PMHNP-BC 11/18/2015, 1:27 PM

## 2015-11-28 ENCOUNTER — Emergency Department (HOSPITAL_COMMUNITY)
Admission: EM | Admit: 2015-11-28 | Discharge: 2015-11-29 | Disposition: A | Discharge status: Home / Self Care | Payer: Medicaid Other | Attending: Emergency Medicine | Admitting: Emergency Medicine

## 2015-11-28 ENCOUNTER — Encounter (HOSPITAL_COMMUNITY): Payer: Self-pay | Admitting: Emergency Medicine

## 2015-11-28 DIAGNOSIS — I10 Essential (primary) hypertension: Secondary | ICD-10-CM | POA: Diagnosis not present

## 2015-11-28 DIAGNOSIS — F329 Major depressive disorder, single episode, unspecified: Secondary | ICD-10-CM | POA: Diagnosis not present

## 2015-11-28 DIAGNOSIS — R45851 Suicidal ideations: Secondary | ICD-10-CM | POA: Diagnosis present

## 2015-11-28 DIAGNOSIS — Z79899 Other long term (current) drug therapy: Secondary | ICD-10-CM | POA: Insufficient documentation

## 2015-11-28 DIAGNOSIS — F1420 Cocaine dependence, uncomplicated: Secondary | ICD-10-CM | POA: Diagnosis present

## 2015-11-28 DIAGNOSIS — F1494 Cocaine use, unspecified with cocaine-induced mood disorder: Secondary | ICD-10-CM | POA: Diagnosis not present

## 2015-11-28 DIAGNOSIS — F1121 Opioid dependence, in remission: Secondary | ICD-10-CM | POA: Diagnosis present

## 2015-11-28 DIAGNOSIS — Z87891 Personal history of nicotine dependence: Secondary | ICD-10-CM | POA: Diagnosis not present

## 2015-11-28 DIAGNOSIS — F1424 Cocaine dependence with cocaine-induced mood disorder: Secondary | ICD-10-CM | POA: Insufficient documentation

## 2015-11-28 DIAGNOSIS — Z7982 Long term (current) use of aspirin: Secondary | ICD-10-CM | POA: Insufficient documentation

## 2015-11-28 LAB — COMPREHENSIVE METABOLIC PANEL
ALBUMIN: 4.9 g/dL (ref 3.5–5.0)
ALK PHOS: 36 U/L — AB (ref 38–126)
ALT: 12 U/L — AB (ref 17–63)
AST: 15 U/L (ref 15–41)
Anion gap: 6 (ref 5–15)
BUN: 9 mg/dL (ref 6–20)
CALCIUM: 8.9 mg/dL (ref 8.9–10.3)
CHLORIDE: 105 mmol/L (ref 101–111)
CO2: 28 mmol/L (ref 22–32)
CREATININE: 0.8 mg/dL (ref 0.61–1.24)
GFR calc non Af Amer: >60 mL/min (ref >60)
GLUCOSE: 109 mg/dL — AB (ref 65–99)
Potassium: 3.2 mmol/L — ABNORMAL LOW (ref 3.5–5.1)
SODIUM: 139 mmol/L (ref 135–145)
Total Bilirubin: 1.2 mg/dL (ref 0.3–1.2)
Total Protein: 7 g/dL (ref 6.5–8.1)

## 2015-11-28 LAB — RAPID URINE DRUG SCREEN, HOSP PERFORMED
AMPHETAMINES: NOT DETECTED
BARBITURATES: NOT DETECTED
Benzodiazepines: POSITIVE — AB
Cocaine: POSITIVE — AB
Opiates: NOT DETECTED
TETRAHYDROCANNABINOL: POSITIVE — AB

## 2015-11-28 LAB — CBC
HEMATOCRIT: 42.5 % (ref 39.0–52.0)
HEMOGLOBIN: 14.2 g/dL (ref 13.0–17.0)
MCH: 30.9 pg (ref 26.0–34.0)
MCHC: 33.4 g/dL (ref 30.0–36.0)
MCV: 92.4 fL (ref 78.0–100.0)
Platelets: 285 10*3/uL (ref 150–400)
RBC: 4.6 MIL/uL (ref 4.22–5.81)
RDW: 13.5 % (ref 11.5–15.5)
WBC: 10.9 10*3/uL — ABNORMAL HIGH (ref 4.0–10.5)

## 2015-11-28 LAB — SALICYLATE LEVEL: Salicylate Lvl: <4 mg/dL (ref 2.8–30.0)

## 2015-11-28 LAB — ACETAMINOPHEN LEVEL: Acetaminophen (Tylenol), Serum: <10 ug/mL — ABNORMAL LOW (ref 10–30)

## 2015-11-28 LAB — ETHANOL: Alcohol, Ethyl (B): <5 mg/dL (ref <5)

## 2015-11-28 MED ORDER — HYDROXYZINE HCL 25 MG PO TABS: 25.0000 mg | ORAL_TABLET | Freq: Four times a day (QID) | ORAL | Status: DC | PRN

## 2015-11-28 MED ORDER — METHOCARBAMOL 500 MG PO TABS
500.0000 mg | ORAL_TABLET | Freq: Three times a day (TID) | ORAL | Status: DC | PRN
  Administered 2015-11-29: 500 mg via ORAL
  Filled 2015-11-28: qty 1

## 2015-11-28 MED ORDER — ADULT MULTIVITAMIN W/MINERALS CH
1.0000 | ORAL_TABLET | Freq: Every day | ORAL | Status: DC
  Administered 2015-11-28: 1 via ORAL
  Filled 2015-11-28: qty 1

## 2015-11-28 MED ORDER — CHLORDIAZEPOXIDE HCL 25 MG PO CAPS
50.0000 mg | ORAL_CAPSULE | Freq: Once | ORAL | Status: AC
  Administered 2015-11-28: 50 mg via ORAL
  Filled 2015-11-28: qty 2

## 2015-11-28 MED ORDER — VITAMIN B-1 100 MG PO TABS
100.0000 mg | ORAL_TABLET | Freq: Every day | ORAL | Status: DC
  Administered 2015-11-28: 100 mg via ORAL
  Filled 2015-11-28: qty 1

## 2015-11-28 MED ORDER — CLONIDINE HCL 0.1 MG PO TABS
0.1000 mg | ORAL_TABLET | Freq: Four times a day (QID) | ORAL | Status: DC
  Administered 2015-11-29: 0.1 mg via ORAL
  Filled 2015-11-28: qty 1

## 2015-11-28 MED ORDER — CLONIDINE HCL 0.1 MG PO TABS: 0.1000 mg | ORAL_TABLET | ORAL | Status: DC

## 2015-11-28 MED ORDER — CLONIDINE HCL 0.1 MG PO TABS: 0.1000 mg | ORAL_TABLET | Freq: Every day | ORAL | Status: DC

## 2015-11-28 MED ORDER — LORAZEPAM 1 MG PO TABS: 0.0000 mg | ORAL_TABLET | Freq: Four times a day (QID) | ORAL | Status: DC

## 2015-11-28 MED ORDER — ARIPIPRAZOLE 15 MG PO TABS
15.0000 mg | ORAL_TABLET | Freq: Every day | ORAL | Status: DC
  Administered 2015-11-28: 15 mg via ORAL
  Filled 2015-11-28 (×2): qty 1

## 2015-11-28 MED ORDER — FLUOXETINE HCL 20 MG PO CAPS
60.0000 mg | ORAL_CAPSULE | Freq: Every day | ORAL | Status: DC
  Administered 2015-11-28 – 2015-11-29 (×2): 60 mg via ORAL
  Filled 2015-11-28 (×2): qty 3

## 2015-11-28 MED ORDER — NAPROXEN 500 MG PO TABS
500.0000 mg | ORAL_TABLET | Freq: Two times a day (BID) | ORAL | Status: DC | PRN
  Administered 2015-11-29: 500 mg via ORAL
  Filled 2015-11-28: qty 1

## 2015-11-28 MED ORDER — BENZTROPINE MESYLATE 1 MG PO TABS
0.5000 mg | ORAL_TABLET | Freq: Every day | ORAL | Status: DC
  Administered 2015-11-28: 0.5 mg via ORAL
  Filled 2015-11-28: qty 1

## 2015-11-28 MED ORDER — THIAMINE HCL 100 MG/ML IJ SOLN: 100.0000 mg | Freq: Every day | INTRAMUSCULAR | Status: DC

## 2015-11-28 MED ORDER — DICYCLOMINE HCL 20 MG PO TABS: 20.0000 mg | ORAL_TABLET | Freq: Four times a day (QID) | ORAL | Status: DC | PRN

## 2015-11-28 MED ORDER — POTASSIUM CHLORIDE CRYS ER 10 MEQ PO TBCR
10.0000 meq | EXTENDED_RELEASE_TABLET | Freq: Every day | ORAL | Status: DC
  Administered 2015-11-28 – 2015-11-29 (×2): 10 meq via ORAL
  Filled 2015-11-28 (×2): qty 1

## 2015-11-28 MED ORDER — LORAZEPAM 1 MG PO TABS
0.0000 mg | ORAL_TABLET | Freq: Two times a day (BID) | ORAL | Status: DC
Start: 2015-11-30 — End: 2015-11-28

## 2015-11-28 MED ORDER — CHLORDIAZEPOXIDE HCL 25 MG PO CAPS
25.0000 mg | ORAL_CAPSULE | Freq: Four times a day (QID) | ORAL | Status: DC | PRN
  Administered 2015-11-28: 25 mg via ORAL
  Filled 2015-11-28: qty 1

## 2015-11-28 MED ORDER — TRAZODONE HCL 50 MG PO TABS
150.0000 mg | ORAL_TABLET | Freq: Every day | ORAL | Status: DC
  Administered 2015-11-28: 150 mg via ORAL
  Filled 2015-11-28: qty 1

## 2015-11-28 MED ORDER — LOPERAMIDE HCL 2 MG PO CAPS: 2.0000 mg | ORAL_CAPSULE | ORAL | Status: DC | PRN

## 2015-11-28 NOTE — ED Provider Notes (Signed)
CSN: [id number]     Arrival date & time 11/28/15  1343 History   First MD Initiated Contact with Patient 11/28/15 1401     Chief Complaint  Patient presents with  . Suicidal     (Consider location/radiation/quality/duration/timing/severity/associated sxs/prior Treatment) Patient is a 40 y.o. male presenting with mental health disorder. The history is provided by the patient.  Mental Health Problem Presenting symptoms: suicidal thoughts   Presenting symptoms: no self mutilation   Patient accompanied by:  Law enforcement Degree of incapacity (severity):  Moderate Onset quality:  Sudden Duration:  2 days Timing:  Constant Progression:  Worsening Chronicity:  Recurrent Context: alcohol use and drug abuse   Treatment compliance:  Untreated Time since last psychoactive medication taken:  2 days Relieved by:  Nothing Worsened by:  Nothing tried Ineffective treatments:  None tried Associated symptoms: no abdominal pain, no chest pain and no headaches    40 yo M With a chief complaint of suicidal ideation. The patient was under arrest for outstanding warrants when the handcuffs were placed he then told the police that he was suicidal. They brought him here per protocol. Patient states that he has been suicidal for the past couple days. Was recently discharged from behavioral health about the same time ago. States he has not taken his medications since then has been on a cocaine binge. Is worried that he is withdrawing from Suboxone because he is not going back to the clinic.  Past Medical History  Diagnosis Date  . BPH (benign prostatic hyperplasia)   . Anxiety   . Depression   . ADHD (attention deficit hyperactivity disorder)   . Abdominal pain, recurrent     chronic lower abdominal pain  . Narcotic abuse     opiates  . Chronic pain   . Hypertension    Past Surgical History  Procedure Laterality Date  . Appendectomy  08/2005   Family History  Problem Relation Age of Onset  .  Hypertension Father   . Prostate cancer Other   . Mental illness Maternal Aunt   . Drug abuse Maternal Aunt    Social History  Substance Use Topics  . Smoking status: Former Smoker -- 0.50 packs/day    Types: Cigarettes    Quit date: 03/10/2014  . Smokeless tobacco: Never Used  . Alcohol Use: No     Comment: occasion    Review of Systems  Constitutional: Negative for fever and chills.  HENT: Negative for congestion and facial swelling.   Eyes: Negative for discharge and visual disturbance.  Respiratory: Negative for shortness of breath.   Cardiovascular: Negative for chest pain and palpitations.  Gastrointestinal: Negative for vomiting, abdominal pain and diarrhea.  Musculoskeletal: Negative for myalgias and arthralgias.  Skin: Negative for color change and rash.  Neurological: Negative for tremors, syncope and headaches.  Psychiatric/Behavioral: Positive for suicidal ideas. Negative for confusion, self-injury and dysphoric mood.      Allergies  Escitalopram oxalate and Contrast media  Home Medications   Prior to Admission medications   Medication Sig Start Date End Date Taking? Authorizing Provider  ARIPiprazole (ABILIFY) 15 MG tablet Take 1 tablet (15 mg total) by mouth at bedtime. 11/10/15   Truman Haywardakia S Starkes, FNP  aspirin EC 81 MG tablet Take 81 mg by mouth at bedtime.     Historical Provider, MD  benztropine (COGENTIN) 0.5 MG tablet Take 1 tablet (0.5 mg total) by mouth at bedtime. 11/10/15   Truman Haywardakia S Starkes, FNP  FLUoxetine (PROZAC) 20  MG capsule Take 3 capsules (60 mg total) by mouth daily. 11/10/15   Truman Hayward, FNP  methylphenidate (RITALIN) 10 MG tablet Take 2 tablets (20 mg total) by mouth 2 (two) times daily with breakfast and lunch. 11/10/15   Truman Hayward, FNP  Multiple Vitamin (MULTIVITAMIN) tablet Take 1 tablet by mouth daily.    Historical Provider, MD  oxybutynin (DITROPAN-XL) 5 MG 24 hr tablet Take 1 tablet (5 mg total) by mouth 2 (two) times daily.  11/10/15   Truman Hayward, FNP  SUBOXONE 8-2 MG FILM Take 1 Film by mouth 3 (three) times daily.  10/29/15   Historical Provider, MD  traZODone (DESYREL) 150 MG tablet Take 1 tablet (150 mg total) by mouth at bedtime. 11/10/15   Truman Hayward, FNP   BP 117/76 mmHg  Pulse 100  Temp(Src) 98.2 F (36.8 C) (Oral)  Resp 16  SpO2 100% Physical Exam  Constitutional: He is oriented to person, place, and time. He appears well-developed and well-nourished.  HENT:  Head: Normocephalic and atraumatic.  Eyes: EOM are normal. Pupils are equal, round, and reactive to light.  Neck: Normal range of motion. Neck supple. No JVD present.  Cardiovascular: Normal rate and regular rhythm.  Exam reveals no gallop and no friction rub.   No murmur heard. Pulmonary/Chest: No respiratory distress. He has no wheezes.  Abdominal: He exhibits no distension. There is no tenderness. There is no rebound and no guarding.  Musculoskeletal: Normal range of motion.  Neurological: He is alert and oriented to person, place, and time.  Skin: No rash noted. No pallor.  Psychiatric: He has a normal mood and affect. His behavior is normal. He expresses suicidal ideation. He expresses no homicidal ideation. He expresses suicidal plans. He expresses no homicidal plans.  Nursing note and vitals reviewed.   ED Course  Procedures (including critical care time) Labs Review Labs Reviewed  COMPREHENSIVE METABOLIC PANEL  ETHANOL  SALICYLATE LEVEL  ACETAMINOPHEN LEVEL  CBC  URINE RAPID DRUG SCREEN, HOSP PERFORMED    Imaging Review No results found. I have personally reviewed and evaluated these images and lab results as part of my medical decision-making.   EKG Interpretation None      MDM   Final diagnoses:  None    40 yoM with a chief complaint of suicidal ideation. There is some question to the validity of this complaint is this is keeping him from going to prison. We'll have TTS evaluate.  The patients results  and plan were reviewed and discussed.   Any x-rays performed were independently reviewed by myself.   Differential diagnosis were considered with the presenting HPI.  Medications  LORazepam (ATIVAN) tablet 0-4 mg (not administered)    Followed by  LORazepam (ATIVAN) tablet 0-4 mg (not administered)  thiamine (VITAMIN B-1) tablet 100 mg (not administered)    Or  thiamine (B-1) injection 100 mg (not administered)  ARIPiprazole (ABILIFY) tablet 15 mg (not administered)  benztropine (COGENTIN) tablet 0.5 mg (not administered)  FLUoxetine (PROZAC) capsule 60 mg (not administered)  multivitamin with minerals tablet 1 tablet (not administered)  traZODone (DESYREL) tablet 150 mg (not administered)  chlordiazePOXIDE (LIBRIUM) capsule 50 mg (not administered)    Filed Vitals:   11/28/15 1358  BP: 117/76  Pulse: 100  Temp: 98.2 F (36.8 C)  TempSrc: Oral  Resp: 16  SpO2: 100%    Final diagnoses:  Suicidal ideation  Cocaine use disorder, moderate, dependence ([facility])  Melene Plan, DO 11/28/15 [phone]

## 2015-11-28 NOTE — BH Assessment (Addendum)
Assessment Note  [name] is an 40 y.o. male. Patient sts that he is diagnosed with Bipolar Disorder, Borderline Schizophrenia, ADHD, and Anxiety. Patient currently facing several legal charges (trying to fill prescriptions illegally, forgery, and possession of THC). Patient has a warrant out for his arrest. [employer] was in the process of arresting patient today. During the arrest patient made statements to [employer] that he was suicidal and overdosed today. He reports consuming 's of his prescribed Ritalin in a attempt to end his life. Patient stating that he has felt suicidal for several years. Sts, "I have done nothing useful with my life but use drugs". He has tried to overdose multiple times in the past by overdosing. He has access to his own person firearm. He reports self mutilating behaviors of pulling his finger nails off. Patient doesn't feel that he is able to contract for safety and is anxiously asking for Suboxone. Patient sts, "Mam can I please get something...Marland KitchenMarland KitchenMaybe my Suboxone just so I can stop feeling so bad". Patient has not had Suboxone in the past 2 days. He is currently prescribed 's of Suboxone. Patient taking Suboxone for the past 2 years. The Suboxone is managed by [facility]/ Dr. Stanton Kidney. Patient also shares that he has been on cocaine binge for the past week. His last use of cocaine was yesterday. Patient denies alcohol use. Patient currently reporting several withdrawal symptoms: anxiety, cold/hold flashes, sweats, NVD, and tremors. He also reports a history of seizures. Last and only seizure was in 2015. Patient denies HI. He is currently calm and cooperative. Patient does report hearing voices. Sts, "The voices tell me to kill myself.Marland KitchenMarland KitchenMarland KitchenYou are useless.Marland KitchenMarland KitchenMarland KitchenGet it over with". He denies visual hallucinations. Patient sts that his father is his primary support system.  Diagnosis: Bipolar Disorder, Borderline Schizophrenia, ADHD, and Anxiety.  Past Medical History:   Past Medical History  Diagnosis Date  . BPH (benign prostatic hyperplasia)   . Anxiety   . Depression   . ADHD (attention deficit hyperactivity disorder)   . Abdominal pain, recurrent     chronic lower abdominal pain  . Narcotic abuse     opiates  . Chronic pain   . Hypertension     Past Surgical History  Procedure Laterality Date  . Appendectomy  08/2005    Family History:  Family History  Problem Relation Age of Onset  . Hypertension Father   . Prostate cancer Other   . Mental illness Maternal Aunt   . Drug abuse Maternal Aunt     Social History:  reports that he quit smoking about 20 months ago. His smoking use included Cigarettes. He smoked 0.50 packs per day. He has never used smokeless tobacco. He reports that he uses illicit drugs (Oxycodone, Opium, and Cocaine). He reports that he does not drink alcohol.  Additional Social History:  Alcohol / Drug Use Pain Medications: see chart Prescriptions: see chart Over the Counter: see chart History of alcohol / drug use?: Yes Longest period of sobriety (when/how long): 6 months Negative Consequences of Use: Financial, Legal, Personal relationships Withdrawal Symptoms: Diarrhea, Sweats, Tingling, Weakness, Nausea / Vomiting, Irritability, Tremors, Fever / Chills, Cramps, Agitation, Seizures Onset of Seizures: 2015 Date of most recent seizure: 2015 Substance #1 Name of Substance 1: Cocaine 1 - Age of First Use: 16 1 - Amount (size/oz): "as much as I can get" 1 - Frequency: daily for the past 3 weeks (past 2 days binging on cocaine" 1 - Duration: ongoing for the  past 3 weeks 1 - Last Use / Amount: last night  Substance #2 Name of Substance 2: Suboxone 2 - Age of First Use: 40 yrs old  2 - Amount (size/oz): 24mg 's per day 2 - Frequency: daily  2 - Duration: x2 years 2 - Last Use / Amount: 2 days ago  CIWA: CIWA-Ar BP: 117/76 mmHg Pulse Rate: 100 COWS:    Allergies:  Allergies  Allergen Reactions  .  Escitalopram Oxalate Hives and Swelling  . Contrast Media [Iodinated Diagnostic Agents] Nausea And Vomiting    Home Medications:  (Not in a hospital admission)  OB/GYN Status:  No LMP for male patient.  General Assessment Data [facility] T[facility] Assessment: In system Is this a Tele or Face-to-Face Assessment?: Face-to-Face Is this an Initial Assessment or a Re-assessment for this encounter?: Initial Assessment Marital status: Single Maiden name:  (none reported) Is patient pregnant?: No Pregnancy Status: No Living Arrangements: Parent Can pt return to current living arrangement?: Yes Admission Status: Voluntary Is patient capable of signing voluntary admission?: Yes Referral Source: Self/Family/Friend Insurance type:  (Medicaid)     Crisis Care Plan Living Arrangements: Parent Legal Guardian: Other: ("My mother is my payee but I don't have a guardian") [facility]Tedd [facility])  Education Status Is patient currently in school?: No Current Grade:  (n/a) Highest grade of school patient has completed: 12th Name of school:  (n/a)  Risk to self with the past 6 months Suicidal Ideation: Yes-Currently Present Has patient been a risk to self within the past 6 months prior to admission? : Yes Suicidal Intent: Yes-Currently Present Has patient had any suicidal intent within the past 6 months prior to admission? : Yes Is patient at risk for suicide?: Yes Suicidal Plan?: Yes-Currently Present Has patient had any suicidal plan within the past 6 months prior to admission? : Yes Specify Current Suicidal Plan:  ("I took an excessive amt of meds 200mg 's of Ritalin") Access to Means: Yes Specify Access to Suicidal Means:  (prescription of Ritalin) What has been your use of drugs/alcohol within the last 12 months?:  (polysubstance abuse;cocaine, thc,  opiates) Previous Attempts/Gestures: Yes How many times?:  ("Several times"; overdoses) Other Self Harm Risks:  ("I pull off my finger nails to make them bleed") Triggers for Past Attempts: Family contact, Other personal contacts Intentional Self Injurious Behavior: None Family Suicide History: Yes (Aunt committed suicide) Recent stressful life event(s): Conflict (Comment), Loss (Comment), Financial Problems Persecutory voices/beliefs?: No Depression: Yes Depression Symptoms: Feeling angry/irritable, Feeling worthless/self pity, Loss of interest in usual pleasures, Guilt, Isolating, Fatigue, Tearfulness, Insomnia, Despondent Substance abuse history and/or treatment for substance abuse?: No Suicide prevention information given to non-admitted patients: Not applicable  Risk to Others within the past 6 months Homicidal Ideation: No-Not Currently/Within Last 6 Months ("I have had thoughts to hurt associates in the recent past") Does patient have any lifetime risk of violence toward others beyond the six months prior to admission? : No Thoughts of Harm to Others: No-Not Currently Present/Within Last 6 Months ("People would try to take advantage of me") Current Homicidal Intent: No-Not Currently/Within Last 6 Months Current Homicidal Plan: No-Not Currently/Within Last 6 Months Access to Homicidal Means: No Identified Victim:  (Associates; pt refused to provide specific names) History of harm to others?: No Assessment of Violence: None Noted Violent Behavior Description:  (patient calm and cooperative) Does patient have access to weapons?: Yes (  Comment) (40 calber hand gun) Criminal Charges Pending?: Yes Describe Pending Criminal Charges:  (trying to fill prescriptions illegally; poss of THC, forgery) Does patient have a court date: Yes Court Date:  ("Not yet") Is patient on probation?: No  Psychosis Hallucinations: Auditory, With command (Auditory- "Kill myself"; "Your a  bum..useless") Delusions: None noted  Mental Status Report Appearance/Hygiene: Unremarkable Eye Contact: Fair Motor Activity: Unsteady Speech: Logical/coherent Level of Consciousness: Alert Mood: Depressed, Anxious Affect: Anxious, Depressed Anxiety Level: Minimal Thought Processes: Relevant Judgement: Impaired Orientation: Person, Place, Time, Situation, Appropriate for developmental age Obsessive Compulsive Thoughts/Behaviors: None  Cognitive Functioning Concentration: Fair Memory: Recent Intact, Remote Intact IQ: Average Insight: Fair Impulse Control: Fair Appetite: Poor Weight Loss:  ("I'm sure I have loss some weight.Marland KitchenMarland KitchenI just don't know how mu) Weight Gain:  (patient denies ) Sleep: Decreased Total Hours of Sleep:  (3 hrs of sleep per night ) Vegetative Symptoms: None  ADLScreening [facility] Assessment Services) Patient's cognitive ability adequate to safely complete daily activities?: Yes Patient able to express need for assistance with ADLs?: Yes Independently performs ADLs?: Yes (appropriate for developmental age)  Prior Inpatient Therapy Prior Inpatient Therapy: Yes Prior Therapy Dates: Multiple, last admission 2009 Prior Therapy Facilty/Provider(s): [facility] Reason for Treatment: "I can't remember, probably the same thing"  Prior Outpatient Therapy Prior Outpatient Therapy: Yes Prior Therapy Dates: Ongoing [facility] Reason for Treatment: Depression, Anxiety Does patient have an ACCT team?: No Does patient have Intensive In-House Services?  : No Does patient have [facility] services? : No Does patient have P4CC services?: No  ADL Screening (condition at time of admission) Patient's cognitive ability adequate to safely complete daily activities?: Yes Is the patient deaf or have difficulty hearing?: No Does the patient have difficulty seeing, even when wearing glasses/contacts?: No Does the patient have difficulty  concentrating, remembering, or making decisions?: No Patient able to express need for assistance with ADLs?: Yes Does the patient have difficulty dressing or bathing?: No Independently performs ADLs?: Yes (appropriate for developmental age) Does the patient have difficulty walking or climbing stairs?: No Weakness of Legs: None Weakness of Arms/Hands: None  Home Assistive Devices/Equipment Home Assistive Devices/Equipment: None    Abuse/Neglect Assessment (Assessment to be complete while patient is alone) Physical Abuse: Denies Verbal Abuse: Denies Sexual Abuse: Denies Exploitation of patient/patient's resources: Denies Self-Neglect: Denies Values / Beliefs Cultural Requests During Hospitalization: None Spiritual Requests During Hospitalization: None   Advance Directives (For Healthcare) Does patient have an advance directive?: No Would patient like information on creating an advanced directive?: No - patient declined information Nutrition Screen- [facility] Adult/WL/AP Patient's home diet: Regular  Additional Information 1:1 In Past 12 Months?: No CIRT Risk: No Elopement Risk: No Does patient have medical clearance?: No     Disposition:  Disposition Initial Assessment Completed for this Encounter: Yes  On Site Evaluation by:   Reviewed with Physician:    Octaviano Batty 11/28/2015 2:56 PM

## 2015-11-28 NOTE — ED Notes (Signed)
Bed: WBH34 Expected date:  Expected time:  Means of arrival:  Comments: TR 5 

## 2015-11-28 NOTE — ED Notes (Signed)
Patient noted in room. No complaints, stable, in no acute distress. Q15 minute rounds and monitoring via security cameras continue for safety. 

## 2015-11-28 NOTE — Progress Notes (Signed)
Entered in d/c instructions  [name], [name] Schedule an appointment as soon as possible [facility] a [facility] This is your assigned Medicaid [place] access doctor If you prefer to see another Medicaid doctor other than the one on your Medicaid card PLEASE CALL DSS [phone] or [phone] [address]  [place] [address] [phone]

## 2015-11-28 NOTE — ED Notes (Signed)
Pt states that he is a suboxone pt.  States that he is withdrawing.  Was supposed to get it tomorrow.  Pt BIB [employer] voluntary.  Pt states he is suicidal with plan to overdose on pills or hang himself.  Denies HI.  Pt opiate and cocaine addiction.

## 2015-11-28 NOTE — BH Assessment (Signed)
Writer informed by [employer] that atient has a warrant out for his arrest. [facility] [facility] [employer] [facility]. Writer will consult with administration and/or psychiatry regarding this request.

## 2015-11-28 NOTE — ED Notes (Signed)
Patient states he "took 7110 of his prescribed Ritalin" today.

## 2015-11-28 NOTE — ED Notes (Signed)
[employer] states that they were picking pt up on a warrant.  Pt was placed into handcuffs and then told police that he was suicidal.

## 2015-11-29 DIAGNOSIS — F1494 Cocaine use, unspecified with cocaine-induced mood disorder: Secondary | ICD-10-CM

## 2015-11-29 DIAGNOSIS — R45851 Suicidal ideations: Secondary | ICD-10-CM | POA: Diagnosis not present

## 2015-11-29 MED FILL — SUBOXONE 8 MG-2 MG SL FILM: 8-2 | 14 days supply | Qty: 42 | Fill #0

## 2015-11-29 NOTE — Progress Notes (Signed)
Do not continue his Ritalin due to substance abuse and threat to overdose on it or Xanax due to substance abuse and addictive properties.

## 2015-11-29 NOTE — ED Notes (Signed)
Pt is calm and cooperative.  States he has goose flesh but none apparent on skin.  He is walking around unit and appears in no distress.

## 2015-11-29 NOTE — ED Notes (Signed)
Pt discharged in police custody.  Pt was in no distress at discharge.  All belongings were sent with patient.

## 2015-11-29 NOTE — BHH Suicide Risk Assessment (Signed)
Suicide Risk Assessment  Discharge Assessment   [facility] Discharge Suicide Risk Assessment   Principal Problem: Cocaine-induced mood disorder [facility]) Discharge Diagnoses:  Patient Active Problem List   Diagnosis Date Noted  . Cocaine-induced mood disorder (HCC) [F14.94] 11/29/2015    Priority: High  . Cocaine use disorder, moderate, dependence (HCC) [F14.20] 11/05/2015    Priority: High  . Opioid use disorder, moderate, in early remission, on maintenance therapy [F11.90] 11/05/2015    Priority: High  . Cannabis use disorder, moderate, dependence (HCC) [F12.20] 11/05/2015  . Attention deficit hyperactivity disorder (ADHD) [F90.9] 11/05/2015  . MDD (major depressive disorder), recurrent, severe, with psychosis (HCC) [F33.3] 11/05/2015    Total Time spent with patient: 45 minutes   Musculoskeletal: Strength & Muscle Tone: within normal limits Gait & Station: normal Patient leans: N/A  Psychiatric Specialty Exam: Physical Exam  Constitutional: He is oriented to person, place, and time. He appears well-developed and well-nourished.  HENT:  Head: Normocephalic.  Neck: Normal range of motion.  Respiratory: Effort normal.  Musculoskeletal: Normal range of motion.  Neurological: He is alert and oriented to person, place, and time.  Skin: Skin is warm and dry.  Psychiatric: His speech is normal and behavior is normal. Judgment and thought content normal. Cognition and memory are normal. He exhibits a depressed mood.    Review of Systems  Constitutional: Negative.   HENT: Negative.   Eyes: Negative.   Respiratory: Negative.   Cardiovascular: Negative.   Gastrointestinal: Negative.   Genitourinary: Negative.   Musculoskeletal: Negative.   Skin: Negative.   Neurological: Negative.   Endo/Heme/Allergies: Negative.   Psychiatric/Behavioral: Positive for depression and substance abuse.    Blood pressure 93/58, pulse 79, temperature 98.3 F (36.8 C), temperature source Oral, resp. rate  18, SpO2 100 %.There is no weight on file to calculate BMI.  General Appearance: Casual  Eye Contact:  Good  Speech:  Normal Rate  Volume:  Normal  Mood:  Depressed, mild  Affect:  Congruent  Thought Process:  Coherent  Orientation:  Full (Time, Place, and Person)  Thought Content:  WDL  Suicidal Thoughts:  Yes.  without intent/plan  Homicidal Thoughts:  No  Memory:  Immediate;   Good Recent;   Good Remote;   Good  Judgement:  Fair  Insight:  Fair  Psychomotor Activity:  Normal  Concentration:  Concentration: Good and Attention Span: Good  Recall:  Good  Fund of Knowledge:  Good  Language:  Good  Akathisia:  No  Handed:  Right  AIMS (if indicated):     Assets:  Leisure Time Physical Health Resilience Social Support  ADL's:  Intact  Cognition:  WNL  Sleep:      Mental Status Per Nursing Assessment::   On Admission:   depression with suicidal ideations when being arrested  Demographic Factors:  Male and Caucasian  Loss Factors: Legal issues  Historical Factors: NA  Risk Reduction Factors:   Sense of responsibility to family, Living with another person, especially a relative and Positive social support  Continued Clinical Symptoms:  Depression, mild  Cognitive Features That Contribute To Risk:  None    Suicide Risk:  Minimal: No identifiable suicidal ideation.  Patients presenting with no risk factors but with morbid ruminations; may be classified as minimal risk based on the severity of the depressive symptoms  Follow-up Information    Schedule an appointment as soon as possible for a visit with Lonia Blood, MD.   Specialty:  Internal Medicine   [facility]  [facility] This is your assigned Medicaid [place] access doctor If you prefer to see another Medicaid doctor other than the one on your Medicaid card PLEASE CALL DSS [phone] or [phone]    Contact information:   [address]  [place] [address] [phone]       Plan Of  Care/Follow-up recommendations:  Activity:  as tolerated Diet:  heart healthy diet  Jayesh Marbach, NP 11/29/2015, 10:04 AM

## 2015-11-29 NOTE — Consult Note (Signed)
[facility] Consult   Reason for Consult:  Depression with suicidal ideations after being arrested Referring Physician:  EDP Patient Identification: [name] MRN:  [id number] Principal Diagnosis: Cocaine-induced mood disorder [facility]) Diagnosis:   Patient Active Problem List   Diagnosis Date Noted  . Cocaine-induced mood disorder ([place]) [F14.94] 11/29/2015    Priority: High  . Cocaine use disorder, moderate, dependence ([place]) [F14.20] 11/05/2015    Priority: High  . Opioid use disorder, moderate, in early remission, on maintenance therapy [F11.90] 11/05/2015    Priority: High  . Cannabis use disorder, moderate, dependence ([place]) [F12.20] 11/05/2015  . Attention deficit hyperactivity disorder (ADHD) [F90.9] 11/05/2015  . MDD (major depressive disorder), recurrent, severe, with psychosis ([place]) [F33.3] 11/05/2015    Total Time spent with patient: 45 minutes  Subjective:   [name] is a 40 y.o. male patient does not warrant admission.  HPI:  40 yo male who presented to the ED with suicidal ideations while in the process of being arrested and stating he overdosed on Ritalin.  His toxicology report was negative for amphetamines but positive for cocaine, marijuana, benzodiazepines.  He was restarted on his psychiatric medications yesterday.  Today, he is better but still endorsing depression with some passive suicidal ideations.  Ata reports he would overdose on medications which he does not have access to.  On admission, he wanted his Suboxone but on assessment, he said he did not need it.  No withdrawal symptoms noted.  Passive homicidal ideations for the people who "put me here" which he thought was his dad but he is voluntary.  Denies hallucinations.  The police brought him to the ED and is to discharge to their custody.  Past Psychiatric History: polysubstance abuse, depression  Risk to Self: Suicidal Ideation: Yes-Currently Present Suicidal Intent: Yes-Currently  Present Is patient at risk for suicide?: Yes Suicidal Plan?: Yes-Currently Present Specify Current Suicidal Plan:  ("I took an excessive amt of meds 232m's of Ritalin") Access to Means: Yes Specify Access to Suicidal Means:  (prescription of Ritalin) What has been your use of drugs/alcohol within the last 12 months?:  (polysubstance abuse;cocaine, thc, opiates) How many times?:  ("Several times"; overdoses) Other Self Harm Risks:  ("I pull off my finger nails to make them bleed") Triggers for Past Attempts: Family contact, Other personal contacts Intentional Self Injurious Behavior: None Risk to Others: Homicidal Ideation: No-Not Currently/Within Last 6 Months ("I have had thoughts to hurt associates in the recent past") Thoughts of Harm to Others: No-Not Currently Present/Within Last 6 Months ("People would try to take advantage of me") Current Homicidal Intent: No-Not Currently/Within Last 6 Months Current Homicidal Plan: No-Not Currently/Within Last 6 Months Access to Homicidal Means: No Identified Victim:  (Associates; pt refused to provide specific names) History of harm to others?: No Assessment of Violence: None Noted Violent Behavior Description:  (patient calm and cooperative) Does patient have access to weapons?: Yes (Comment) (492calber hand gun) Criminal Charges Pending?: Yes Describe Pending Criminal Charges:  (trying to fill prescriptions illegally; poss of THC, forgery) Does patient have a court date: Yes Court Date:  ("Not yet") Prior Inpatient Therapy: Prior Inpatient Therapy: Yes Prior Therapy Dates: Multiple, last admission 2009 [facility]: [facility]I can't remember, probably the same thing" Prior Outpatient Therapy: Prior Outpatient Therapy: Yes Prior Therapy Dates: Ongoing [facility] Reason for Treatment: Depression, Anxiety Does patient have an ACCT team?: No Does patient  have Intensive In-House Services?  :  No Does patient have [facility] services? : No Does patient have P4CC services?: No  Past Medical History:  Past Medical History  Diagnosis Date  . BPH (benign prostatic hyperplasia)   . Anxiety   . Depression   . ADHD (attention deficit hyperactivity disorder)   . Abdominal pain, recurrent     chronic lower abdominal pain  . Narcotic abuse     opiates  . Chronic pain   . Hypertension     Past Surgical History  Procedure Laterality Date  . Appendectomy  08/2005   Family History:  Family History  Problem Relation Age of Onset  . Hypertension Father   . Prostate cancer Other   . Mental illness Maternal Aunt   . Drug abuse Maternal Aunt    Family Psychiatric  History: none Social History:  History  Alcohol Use No    Comment: occasion     History  Drug Use  . Yes  . Special: Oxycodone, Opium, Cocaine    Social History   Social History  . Marital Status: Single    Spouse Name: N/A  . Number of Children: N/A  . Years of Education: N/A   Social History Main Topics  . Smoking status: Former Smoker -- 0.50 packs/day    Types: Cigarettes    Quit date: 03/10/2014  . Smokeless tobacco: Never Used  . Alcohol Use: No     Comment: occasion  . Drug Use: Yes    Special: Oxycodone, Opium, Cocaine  . Sexual Activity: Yes   Other Topics Concern  . None   Social History Narrative   Additional Social History:    Allergies:   Allergies  Allergen Reactions  . Escitalopram Oxalate Hives and Swelling  . Contrast Media [Iodinated Diagnostic Agents] Nausea And Vomiting    Labs:  Results for orders placed or performed during the hospital encounter of 11/28/15 (from the past 48 hour(s))  Comprehensive metabolic panel     Status: Abnormal   Collection Time: 11/28/15  2:12 PM  Result Value Ref Range   Sodium 139 135 - 145 mmol/L   Potassium 3.2 (L) 3.5 - 5.1 mmol/L   Chloride 105 101 - 111 mmol/L   CO2 28 22 - 32 mmol/L   Glucose,  Bld 109 (H) 65 - 99 mg/dL   BUN 9 6 - 20 mg/dL   Creatinine, Ser 0.80 0.61 - 1.24 mg/dL   Calcium 8.9 8.9 - 10.3 mg/dL   Total Protein 7.0 6.5 - 8.1 g/dL   Albumin 4.9 3.5 - 5.0 g/dL   AST 15 15 - 41 U/L   ALT 12 (L) 17 - 63 U/L   Alkaline Phosphatase 36 (L) 38 - 126 U/L   Total Bilirubin 1.2 0.3 - 1.2 mg/dL   GFR calc non Af Amer >60 >60 mL/min   GFR calc Af Amer >60 >60 mL/min    Comment: (NOTE) The eGFR has been calculated using the CKD EPI equation. This calculation has not been validated in all clinical situations. eGFR's persistently <60 mL/min signify possible Chronic Kidney Disease.    Anion gap 6 5 - 15  cbc     Status: Abnormal   Collection Time: 11/28/15  2:12 PM  Result Value Ref Range   WBC 10.9 (H) 4.0 - 10.5 K/uL   RBC 4.60 4.22 - 5.81 MIL/uL   Hemoglobin 14.2 13.0 - 17.0 g/dL   HCT 42.5 39.0 - 52.0 %   MCV 92.4 78.0 - 100.0 fL   MCH  30.9 26.0 - 34.0 pg   MCHC 33.4 30.0 - 36.0 g/dL   RDW 13.5 11.5 - 15.5 %   Platelets 285 150 - 400 K/uL  Ethanol     Status: None   Collection Time: 11/28/15  2:14 PM  Result Value Ref Range   Alcohol, Ethyl (B) <5 <5 mg/dL    Comment:        LOWEST DETECTABLE LIMIT FOR SERUM ALCOHOL IS 5 mg/dL FOR MEDICAL PURPOSES ONLY   Salicylate level     Status: None   Collection Time: 11/28/15  2:14 PM  Result Value Ref Range   Salicylate Lvl <2.6 2.8 - 30.0 mg/dL  Acetaminophen level     Status: Abnormal   Collection Time: 11/28/15  2:14 PM  Result Value Ref Range   Acetaminophen (Tylenol), Serum <10 (L) 10 - 30 ug/mL    Comment:        THERAPEUTIC CONCENTRATIONS VARY SIGNIFICANTLY. A RANGE OF 10-30 ug/mL MAY BE AN EFFECTIVE CONCENTRATION FOR MANY PATIENTS. HOWEVER, SOME ARE BEST TREATED AT CONCENTRATIONS OUTSIDE THIS RANGE. ACETAMINOPHEN CONCENTRATIONS >150 ug/mL AT 4 HOURS AFTER INGESTION AND >50 ug/mL AT 12 HOURS AFTER INGESTION ARE OFTEN ASSOCIATED WITH TOXIC REACTIONS.   Rapid urine drug screen (hospital  performed)     Status: Abnormal   Collection Time: 11/28/15  6:13 PM  Result Value Ref Range   Opiates NONE DETECTED NONE DETECTED   Cocaine POSITIVE (A) NONE DETECTED   Benzodiazepines POSITIVE (A) NONE DETECTED   Amphetamines NONE DETECTED NONE DETECTED   Tetrahydrocannabinol POSITIVE (A) NONE DETECTED   Barbiturates NONE DETECTED NONE DETECTED    Comment:        DRUG SCREEN FOR MEDICAL PURPOSES ONLY.  IF CONFIRMATION IS NEEDED FOR ANY PURPOSE, NOTIFY LAB WITHIN 5 DAYS.        LOWEST DETECTABLE LIMITS FOR URINE DRUG SCREEN Drug Class       Cutoff (ng/mL) Amphetamine      1000 Barbiturate      200 Benzodiazepine   203 Tricyclics       559 Opiates          300 Cocaine          300 THC              50     Current Facility-Administered Medications  Medication Dose Route Frequency Provider Last Rate Last Dose  . ARIPiprazole (ABILIFY) tablet 15 mg  15 mg Oral QHS Deno Etienne, DO   15 mg at 11/28/15 2058  . benztropine (COGENTIN) tablet 0.5 mg  0.5 mg Oral QHS Deno Etienne, DO   0.5 mg at 11/28/15 2057  . cloNIDine (CATAPRES) tablet 0.1 mg  0.1 mg Oral QID Laverle Hobby, PA-C   0.1 mg at 11/29/15 0935   Followed by  . [START ON 12/01/2015] cloNIDine (CATAPRES) tablet 0.1 mg  0.1 mg Oral BH-qamhs Spencer E Simon, PA-C       Followed by  . [START ON 12/04/2015] cloNIDine (CATAPRES) tablet 0.1 mg  0.1 mg Oral QAC breakfast Laverle Hobby, PA-C      . dicyclomine (BENTYL) tablet 20 mg  20 mg Oral Q6H PRN Laverle Hobby, PA-C      . FLUoxetine (PROZAC) capsule 60 mg  60 mg Oral Daily Deno Etienne, DO   60 mg at 11/29/15 0935  . hydrOXYzine (ATARAX/VISTARIL) tablet 25 mg  25 mg Oral Q6H PRN Laverle Hobby, PA-C      .  loperamide (IMODIUM) capsule 2-4 mg  2-4 mg Oral PRN Laverle Hobby, PA-C      . methocarbamol (ROBAXIN) tablet 500 mg  500 mg Oral Q8H PRN Laverle Hobby, PA-C   500 mg at 11/29/15 0935  . naproxen (NAPROSYN) tablet 500 mg  500 mg Oral BID PRN Laverle Hobby, PA-C   500 mg  at 11/29/15 0935  . potassium chloride (K-DUR,KLOR-CON) CR tablet 10 mEq  10 mEq Oral Daily Patrecia Pour, NP   10 mEq at 11/29/15 0935  . traZODone (DESYREL) tablet 150 mg  150 mg Oral QHS Deno Etienne, DO   150 mg at 11/28/15 2057   Current Outpatient Prescriptions  Medication Sig Dispense Refill  . ARIPiprazole (ABILIFY) 15 MG tablet Take 1 tablet (15 mg total) by mouth at bedtime. 14 tablet 0  . aspirin EC 81 MG tablet Take 81 mg by mouth at bedtime.     . benztropine (COGENTIN) 0.5 MG tablet Take 1 tablet (0.5 mg total) by mouth at bedtime. 14 tablet 0  . clonazePAM (KLONOPIN) 0.5 MG tablet Take 1 tablet by mouth 2 (two) times daily.  1  . methylphenidate (RITALIN) 10 MG tablet Take 2 tablets (20 mg total) by mouth 2 (two) times daily with breakfast and lunch. 28 tablet 0  . oxybutynin (DITROPAN-XL) 5 MG 24 hr tablet Take 1 tablet (5 mg total) by mouth 2 (two) times daily. 28 tablet 0  . SUBOXONE 8-2 MG FILM Take 1 Film by mouth 3 (three) times daily.   0  . traZODone (DESYREL) 150 MG tablet Take 1 tablet (150 mg total) by mouth at bedtime. 14 tablet 0  . FLUoxetine (PROZAC) 20 MG capsule Take 3 capsules (60 mg total) by mouth daily. 14 capsule 0  . Multiple Vitamin (MULTIVITAMIN) tablet Take 1 tablet by mouth daily.      Musculoskeletal: Strength & Muscle Tone: within normal limits Gait & Station: normal Patient leans: N/A  Psychiatric Specialty Exam: Physical Exam  Constitutional: He is oriented to person, place, and time. He appears well-developed and well-nourished.  HENT:  Head: Normocephalic.  Neck: Normal range of motion.  Respiratory: Effort normal.  Musculoskeletal: Normal range of motion.  Neurological: He is alert and oriented to person, place, and time.  Skin: Skin is warm and dry.  Psychiatric: His speech is normal and behavior is normal. Judgment and thought content normal. Cognition and memory are normal. He exhibits a depressed mood.    Review of Systems   Constitutional: Negative.   HENT: Negative.   Eyes: Negative.   Respiratory: Negative.   Cardiovascular: Negative.   Gastrointestinal: Negative.   Genitourinary: Negative.   Musculoskeletal: Negative.   Skin: Negative.   Neurological: Negative.   Endo/Heme/Allergies: Negative.   Psychiatric/Behavioral: Positive for depression and substance abuse.    Blood pressure 93/58, pulse 79, temperature 98.3 F (36.8 C), temperature source Oral, resp. rate 18, SpO2 100 %.There is no weight on file to calculate BMI.  General Appearance: Casual  Eye Contact:  Good  Speech:  Normal Rate  Volume:  Normal  Mood:  Depressed, mild  Affect:  Congruent  Thought Process:  Coherent  Orientation:  Full (Time, Place, and Person)  Thought Content:  WDL  Suicidal Thoughts:  Yes.  without intent/plan  Homicidal Thoughts:  No  Memory:  Immediate;   Good Recent;   Good Remote;   Good  Judgement:  Fair  Insight:  Fair  Psychomotor Activity:  Normal  Concentration:  Concentration: Good and Attention Span: Good  Recall:  Good  Fund of Knowledge:  Good  Language:  Good  Akathisia:  No  Handed:  Right  AIMS (if indicated):     Assets:  Leisure Time Physical Health Resilience Social Support  ADL's:  Intact  Cognition:  WNL  Sleep:        Treatment Plan Summary: Daily contact with patient to assess and evaluate symptoms and progress in treatment, Medication management and Plan cocaine induced mood disorder:  -Crisis stabilization -Medication management: Clonidine Opiate Withdrawal Protocol in place, started Abilify 15 mg at bedtime for mood stabilization, Cogentin 0.5 mg at bedtime for EPS, and Trazodone 150 mg at bedtime for sleep -Individual and substance abuse counseling  Disposition: No evidence of imminent risk to self or others at present.    Waylan Boga, NP 11/29/2015 9:48 AM Patient seen face-to-face for psychiatric evaluation, chart reviewed and case discussed with the physician  extender and developed treatment plan. Reviewed the information documented and agree with the treatment plan. Corena Pilgrim, MD

## 2015-12-05 ENCOUNTER — Encounter (HOSPITAL_COMMUNITY): Payer: Self-pay | Admitting: Emergency Medicine

## 2015-12-05 ENCOUNTER — Emergency Department (HOSPITAL_COMMUNITY)
Admission: EM | Admit: 2015-12-05 | Discharge: 2015-12-05 | Disposition: A | Discharge status: Home / Self Care | Payer: Medicaid Other | Attending: Dermatology | Admitting: Dermatology

## 2015-12-05 DIAGNOSIS — Z79899 Other long term (current) drug therapy: Secondary | ICD-10-CM | POA: Insufficient documentation

## 2015-12-05 DIAGNOSIS — F418 Other specified anxiety disorders: Secondary | ICD-10-CM | POA: Insufficient documentation

## 2015-12-05 DIAGNOSIS — Z87891 Personal history of nicotine dependence: Secondary | ICD-10-CM | POA: Diagnosis not present

## 2015-12-05 DIAGNOSIS — Z7982 Long term (current) use of aspirin: Secondary | ICD-10-CM | POA: Insufficient documentation

## 2015-12-05 DIAGNOSIS — Z5321 Procedure and treatment not carried out due to patient leaving prior to being seen by health care provider: Secondary | ICD-10-CM | POA: Diagnosis not present

## 2015-12-05 DIAGNOSIS — R1030 Lower abdominal pain, unspecified: Secondary | ICD-10-CM | POA: Diagnosis not present

## 2015-12-05 DIAGNOSIS — I10 Essential (primary) hypertension: Secondary | ICD-10-CM | POA: Diagnosis not present

## 2015-12-05 LAB — CBC
HCT: 45.6 % (ref 39.0–52.0)
Hemoglobin: 14.9 g/dL (ref 13.0–17.0)
MCH: 31.1 pg (ref 26.0–34.0)
MCHC: 32.7 g/dL (ref 30.0–36.0)
MCV: 95.2 fL (ref 78.0–100.0)
PLATELETS: 250 10*3/uL (ref 150–400)
RBC: 4.79 MIL/uL (ref 4.22–5.81)
RDW: 14 % (ref 11.5–15.5)
WBC: 11.1 10*3/uL — AB (ref 4.0–10.5)

## 2015-12-05 LAB — COMPREHENSIVE METABOLIC PANEL
ALBUMIN: 4.1 g/dL (ref 3.5–5.0)
ALK PHOS: 47 U/L (ref 38–126)
ALT: 19 U/L (ref 17–63)
AST: 21 U/L (ref 15–41)
Anion gap: 4 — ABNORMAL LOW (ref 5–15)
BUN: 12 mg/dL (ref 6–20)
CALCIUM: 9.6 mg/dL (ref 8.9–10.3)
CO2: 31 mmol/L (ref 22–32)
CREATININE: 0.97 mg/dL (ref 0.61–1.24)
Chloride: 105 mmol/L (ref 101–111)
GFR calc Af Amer: >60 mL/min (ref >60)
GFR calc non Af Amer: >60 mL/min (ref >60)
Glucose, Bld: 102 mg/dL — ABNORMAL HIGH (ref 65–99)
Potassium: 4.6 mmol/L (ref 3.5–5.1)
SODIUM: 140 mmol/L (ref 135–145)
TOTAL PROTEIN: 6.6 g/dL (ref 6.5–8.1)
Total Bilirubin: 0.6 mg/dL (ref 0.3–1.2)

## 2015-12-05 LAB — LIPASE, BLOOD: LIPASE: 18 U/L (ref 11–51)

## 2015-12-05 NOTE — ED Notes (Signed)
No answer for vital sign recheck. 

## 2015-12-05 NOTE — ED Notes (Signed)
Patient called in the main ED waiting area for recheck of vitals with no response

## 2015-12-05 NOTE — ED Notes (Signed)
Pt is aware urine is needed, pt unable to urinate at this time.  

## 2015-12-05 NOTE — ED Notes (Signed)
Pt arrives via EMS from home where pt has had 3 days of lower abdominal pain. Denies recent fever, vomiting. Pt c/o nausea, chills, VSS.

## 2015-12-17 MED FILL — SUBOXONE 8 MG-2 MG SL FILM: 8-2 | 14 days supply | Qty: 42 | Fill #0

## 2015-12-23 ENCOUNTER — Encounter (HOSPITAL_COMMUNITY): Payer: Self-pay

## 2015-12-23 ENCOUNTER — Emergency Department (HOSPITAL_COMMUNITY)
Admission: EM | Admit: 2015-12-23 | Discharge: 2015-12-24 | Disposition: A | Discharge status: Home / Self Care | Payer: Medicaid Other | Attending: Emergency Medicine | Admitting: Emergency Medicine

## 2015-12-23 DIAGNOSIS — F329 Major depressive disorder, single episode, unspecified: Secondary | ICD-10-CM | POA: Insufficient documentation

## 2015-12-23 DIAGNOSIS — F17210 Nicotine dependence, cigarettes, uncomplicated: Secondary | ICD-10-CM | POA: Diagnosis not present

## 2015-12-23 DIAGNOSIS — I10 Essential (primary) hypertension: Secondary | ICD-10-CM | POA: Diagnosis not present

## 2015-12-23 DIAGNOSIS — R45851 Suicidal ideations: Secondary | ICD-10-CM | POA: Insufficient documentation

## 2015-12-23 DIAGNOSIS — Z79899 Other long term (current) drug therapy: Secondary | ICD-10-CM | POA: Insufficient documentation

## 2015-12-23 DIAGNOSIS — Z7982 Long term (current) use of aspirin: Secondary | ICD-10-CM | POA: Diagnosis not present

## 2015-12-23 DIAGNOSIS — F1910 Other psychoactive substance abuse, uncomplicated: Secondary | ICD-10-CM

## 2015-12-23 LAB — CBC
HCT: 45 % (ref 39.0–52.0)
Hemoglobin: 14.6 g/dL (ref 13.0–17.0)
MCH: 30.7 pg (ref 26.0–34.0)
MCHC: 32.4 g/dL (ref 30.0–36.0)
MCV: 94.5 fL (ref 78.0–100.0)
PLATELETS: 244 10*3/uL (ref 150–400)
RBC: 4.76 MIL/uL (ref 4.22–5.81)
RDW: 13.3 % (ref 11.5–15.5)
WBC: 6.8 10*3/uL (ref 4.0–10.5)

## 2015-12-23 LAB — COMPREHENSIVE METABOLIC PANEL
ALK PHOS: 44 U/L (ref 38–126)
ALT: 16 U/L — AB (ref 17–63)
AST: 21 U/L (ref 15–41)
Albumin: 4 g/dL (ref 3.5–5.0)
Anion gap: 5 (ref 5–15)
BILIRUBIN TOTAL: 0.8 mg/dL (ref 0.3–1.2)
BUN: 9 mg/dL (ref 6–20)
CALCIUM: 8.8 mg/dL — AB (ref 8.9–10.3)
CHLORIDE: 106 mmol/L (ref 101–111)
CO2: 29 mmol/L (ref 22–32)
CREATININE: 1.05 mg/dL (ref 0.61–1.24)
Glucose, Bld: 113 mg/dL — ABNORMAL HIGH (ref 65–99)
Potassium: 3.8 mmol/L (ref 3.5–5.1)
Sodium: 140 mmol/L (ref 135–145)
TOTAL PROTEIN: 6.4 g/dL — AB (ref 6.5–8.1)

## 2015-12-23 LAB — RAPID URINE DRUG SCREEN, HOSP PERFORMED
AMPHETAMINES: NOT DETECTED
BENZODIAZEPINES: NOT DETECTED
Barbiturates: NOT DETECTED
COCAINE: POSITIVE — AB
OPIATES: NOT DETECTED
Tetrahydrocannabinol: POSITIVE — AB

## 2015-12-23 LAB — ACETAMINOPHEN LEVEL: Acetaminophen (Tylenol), Serum: <10 ug/mL — ABNORMAL LOW (ref 10–30)

## 2015-12-23 LAB — ETHANOL

## 2015-12-23 LAB — SALICYLATE LEVEL

## 2015-12-23 MED ORDER — FLUOXETINE HCL 20 MG PO CAPS
30.0000 mg | ORAL_CAPSULE | Freq: Once | ORAL | Status: AC
  Administered 2015-12-23: 30 mg via ORAL
  Filled 2015-12-23: qty 1

## 2015-12-23 MED ORDER — CLONIDINE HCL 0.1 MG PO TABS
0.1000 mg | ORAL_TABLET | Freq: Three times a day (TID) | ORAL | Status: DC | PRN
  Administered 2015-12-23 – 2015-12-24 (×2): 0.1 mg via ORAL
  Filled 2015-12-23 (×2): qty 1

## 2015-12-23 NOTE — BH Assessment (Signed)
Tele Assessment Note   [name] is a 40 y.o. male who presented voluntarily to [facility] with complaint of suicidal ideation (without current plan or intent), depressive symptoms, and substance withdrawal ("I feel like I'm getting the flu").  Pt reported as follows:  Pt stated that he has been feeling suicidal for about two days.  He stated that he is on numerous psychotropic medications (Fluoxetine, Trazodone, Cogentin, Abilify, and others) and that he feels they are not effective.  Pt also reported discomfort due to withdrawal from Suboxone.  Pt stated he is prescribed Suboxone through [facility], and that he last used it on Friday.  He stated that he is beginning to experience flu-like symptoms.  Per report, Pt is prescribed 24 mgs of Suboxone, which he has taken for two years.  Pt also recent use of cocaine and marijuana earlier this week (UDS was not available at time of writing; Pt's BAC was less than 5).  Pt reported that he has made numerous suicide attempts in the past.  His last attempt was an overdose on Klonopin which occurred "about a month ago."  For this, he was treated inpatient at [facility].  Pt presented to [facility] for a TTS assessment on May 31 -- at that time, he complained of suicidal ideation.  When asked what Pt hoped by coming to [facility], he stated that he wants his medications checked and that he would like to be treated inpatient for suicidal ideation.  Pt currently lives with three roommates.  He stated that he is on disability for Bipolar Disorder.  Pt has a court appearance on 01/02/16 for two charges of forgery and attempting to obtain prescriptions by fraudulent means.    During assessment, Pt was dressed in scrubs and appeared well-groomed.  He was cooperative and appeared anxious.  Pt asked if he could get help with his withdrawal symptoms.  Pt's mood was depressed and anxious, and affect was congruent.  Pt endorsed the following depressive symptoms:  Suicidal  ideation (currently without plan or intent, but note reported history of suicide attempts), insomnia, despondency, irritability, feelings of hopelessness.  Pt also reported experiencing flu-like withdrawal symptoms (not using Suboxone).  Pt endorsed ongoing substance use -- cocaine (last use was 6/23) and marijuana (last use was 6/22).  Pt endorsed ongoing self-injury -- he stated that he picks skin around his fingers.  Pt's thought processes were within normal limits.  Thought content indicated depressive preoccupation.  There was no evidence of delusion.  Pt's memory and concentration were intact.  Pt's impulse control, judgment, and insight were deemed fair to poor as evidenced by his continued substance use and suicide attempts.  Speech was normal in rate, rhythm, and volume.  Consulted with Molli Knock, NP, who determined that Pt met inpatient criteria due to suicidal ideation.  Ms. Shaune Pollack emphasized that Pt is not to receive Suboxone from hospital staff.  This was communicated to nursing staff who expressed understanding.    Diagnosis: Bipolar Depressed Severe without psychosis; Polysubstance use disorder  Past Medical History:  Past Medical History  Diagnosis Date  . BPH (benign prostatic hyperplasia)   . Anxiety   . Depression   . ADHD (attention deficit hyperactivity disorder)   . Abdominal pain, recurrent     chronic lower abdominal pain  . Narcotic abuse     opiates  . Chronic pain   . Hypertension     Past Surgical History  Procedure Laterality Date  . Appendectomy  08/2005  Family History:  Family History  Problem Relation Age of Onset  . Hypertension Father   . Prostate cancer Other   . Mental illness Maternal Aunt   . Drug abuse Maternal Aunt     Social History:  reports that he quit smoking about 21 months ago. His smoking use included Cigarettes. He smoked 0.50 packs per day. He has never used smokeless tobacco. He reports that he uses illicit drugs (Oxycodone, Opium,  and Cocaine). He reports that he does not drink alcohol.  Additional Social History:     CIWA: CIWA-Ar BP: 119/72 mmHg Pulse Rate: 100 COWS:    PATIENT STRENGTHS: (choose at least two) Average or above average intelligence Capable of independent living Communication skills  Allergies:  Allergies  Allergen Reactions  . Escitalopram Oxalate Hives and Swelling  . Contrast Media [Iodinated Diagnostic Agents] Nausea And Vomiting    Home Medications:  (Not in a hospital admission)  OB/GYN Status:  No LMP for male patient.  General Assessment Data Location of Assessment: [facility] ED TTS Assessment: In system Is this a Tele or Face-to-Face Assessment?: Tele Assessment Is this an Initial Assessment or a Re-assessment for this encounter?: Initial Assessment Marital status: Single Is patient pregnant?: No Pregnancy Status: No Living Arrangements: Other (Comment), Non-relatives/Friends ("Three roommates") Can pt return to current living arrangement?: Yes Admission Status: Voluntary Is patient capable of signing voluntary admission?: Yes Referral Source: Self/Family/Friend Insurance type: [employer]     Crisis Care Plan Living Arrangements: Other (Comment), Non-relatives/Friends ("Three roommates") Legal Guardian: Other: (None) Name of Psychiatrist: Dr. Rosine Door at [facility] (Suboxone through [facility]) Name of Therapist: [facility]  Education Status Is patient currently in school?: No  Risk to self with the past 6 months Suicidal Ideation: Yes-Currently Present Has patient been a risk to self within the past 6 months prior to admission? : Yes Suicidal Intent: No Has patient had any suicidal intent within the past 6 months prior to admission? : Yes Is patient at risk for suicide?: Yes Suicidal Plan?: No Has patient had any suicidal plan within the past 6 months prior to admission? 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 : No Thoughts of Harm to Others: No Current Homicidal Intent: No Current Homicidal Plan: No Access to Homicidal Means: No History of harm to others?: No Assessment of Violence: None Noted Does patient have access to weapons?: Yes (Comment) (.40 caliber handgun (per report)) Criminal Charges Pending?: Yes Describe Pending Criminal Charges: Forgery/Fraud attempt, attempt to obtain scrips (through fraud/forgery) Does patient have a court date: Yes Court Date: 01/02/16 Is patient on probation?: No  Psychosis Hallucinations: None noted (None noted, but Pt has history of auditory  hallucinations) Delusions: None noted  Mental Status Report Appearance/Hygiene: Unremarkable Eye Contact: Good Motor Activity: Restlessness Speech: Logical/coherent Level of Consciousness: Alert Mood: Anxious, Preoccupied, Depressed Affect: Anxious Anxiety Level: Minimal Thought Processes: Coherent, Relevant Judgement:  Impaired Orientation: Person, Place, Time, Situation, Appropriate for developmental age Obsessive Compulsive Thoughts/Behaviors: None  Cognitive Functioning Concentration: Fair Memory: Recent Intact, Remote Intact IQ: Average Insight: Fair Impulse Control: Poor Appetite: Good Sleep: Decreased Vegetative Symptoms: None  ADLScreening [facility] Assessment Services) Patient's cognitive ability adequate to safely complete daily activities?: Yes Patient able to express need for assistance with ADLs?: Yes Independently performs ADLs?: Yes (appropriate for developmental age)  Prior Inpatient Therapy Prior Inpatient Therapy: Yes Prior Therapy Dates: 2017, 2009, other admissions Prior Therapy Facilty/Provider(s): [facility], [facility] Reason for Treatment: Suicidal, OD  Prior Outpatient Therapy Prior Outpatient Therapy: Yes Prior Therapy Dates: Ongoing Prior [facility] Reason for Treatment: Depression, Anxiety Does patient have an ACCT team?: No Does patient have Intensive In-House Services?  : No Does patient have [facility] services? : No Does patient have P4CC services?: No  ADL Screening (condition at time of admission) Patient's cognitive ability adequate to safely complete daily activities?: Yes Patient able to express need for assistance with ADLs?: Yes Independently performs ADLs?: Yes (appropriate for developmental age)             Regulatory affairs officer (For Healthcare) Does patient have an advance directive?: No Would patient like information on creating an advanced directive?: No - patient declined information    Additional Information 1:1 In Past 12 Months?: No CIRT Risk: No Elopement Risk: No Does patient have medical clearance?: Yes     Disposition:  Disposition Initial Assessment Completed for this Encounter: Yes Disposition of Patient: Inpatient treatment program Type of inpatient treatment program: Adult (Per  Thedora Hinders, NP, Pt meets inpt criteria -- see notes)  [name] 12/23/2015 6:40 PM

## 2015-12-23 NOTE — ED Notes (Signed)
Pt up to shower

## 2015-12-23 NOTE — ED Notes (Signed)
Patient here with depression and suicidal ideation x 2 days. Hx of same and not taking meds the past couple of days. Alert and oriented, used cocaine-crack 3 days ago, no ETOH

## 2015-12-23 NOTE — ED Notes (Signed)
Security at bedside to wand pt. 

## 2015-12-23 NOTE — ED Notes (Signed)
Pt made aware of that we will not be providing Suboxone to pt during his stay.  Pt is polite and understanding, but is requesting an explanation, stating [facility]  Told him I would have a provider come and speak with him when possible.

## 2015-12-23 NOTE — ED Notes (Signed)
Pt on TTS assessment

## 2015-12-23 NOTE — ED Notes (Signed)
Pt made aware of Pod C rules and dinner order taken.  Dinner tray ordered, and pt supplied with items by EMT for shower

## 2015-12-23 NOTE — ED Notes (Signed)
Patient changed into scrubs, wanded by security and sitter requested

## 2015-12-23 NOTE — ED Notes (Signed)
Dinner tray delivered.

## 2015-12-23 NOTE — ED Notes (Signed)
Pt asking about home meds.  Spoke to Dr. Hyacinth MeekerMiller.  Will review and submit.

## 2015-12-23 NOTE — ED Provider Notes (Signed)
CSN: [id number]     Arrival date & time 12/23/15  1344 History   First MD Initiated Contact with Patient 12/23/15 1600     Chief Complaint  Patient presents with  . Depression  . Suicidal     (Consider location/radiation/quality/duration/timing/severity/associated sxs/prior Treatment) HPI  The patient is a 40 year old male, history of substance abuse, currently taking Suboxone though he has not had this in 48 hours, also has a history of cocaine use last used 2 days ago, he has been off of his psychiatric medications for a few days as well. He reports he is having increased depression, anxiety, suicidal thoughts, he does have a history of taking overdose of his medications in the past as a suicide attempt. He is currently living with roommates, he does not have any legal misdemeanors or charges he feels physically poor with diffuse body aches because he has not taken his opiates but denies any other physical symptoms. He denies any alcohol use.Marland Kitchen.     Past Medical History  Diagnosis Date  . BPH (benign prostatic hyperplasia)   . Anxiety   . Depression   . ADHD (attention deficit hyperactivity disorder)   . Abdominal pain, recurrent     chronic lower abdominal pain  . Narcotic abuse     opiates  . Chronic pain   . Hypertension    Past Surgical History  Procedure Laterality Date  . Appendectomy  08/2005   Family History  Problem Relation Age of Onset  . Hypertension Father   . Prostate cancer Other   . Mental illness Maternal Aunt   . Drug abuse Maternal Aunt    Social History  Substance Use Topics  . Smoking status: Former Smoker -- 0.50 packs/day    Types: Cigarettes    Quit date: 03/10/2014  . Smokeless tobacco: Never Used  . Alcohol Use: No     Comment: occasion    Review of Systems  All other systems reviewed and are negative.     Allergies  Escitalopram oxalate and Contrast media  Home Medications   Prior to Admission medications   Medication Sig Start  Date End Date Taking? Authorizing Provider  ARIPiprazole (ABILIFY) 15 MG tablet Take 1 tablet (15 mg total) by mouth at bedtime. Patient taking differently: Take 20 mg by mouth at bedtime.  11/10/15  Yes Truman Haywardakia S Starkes, FNP  aspirin EC 81 MG tablet Take 81 mg by mouth at bedtime.    Yes Historical Provider, MD  benztropine (COGENTIN) 0.5 MG tablet Take 1 tablet (0.5 mg total) by mouth at bedtime. 11/10/15  Yes Truman Haywardakia S Starkes, FNP  clonazePAM (KLONOPIN) 0.5 MG tablet Take 0.5 mg by mouth 2 (two) times daily.  11/21/15  Yes Historical Provider, MD  FLUoxetine (PROZAC) 20 MG capsule Take 3 capsules (60 mg total) by mouth daily. 11/10/15  Yes Truman Haywardakia S Starkes, FNP  methylphenidate (RITALIN) 10 MG tablet Take 2 tablets (20 mg total) by mouth 2 (two) times daily with breakfast and lunch. 11/10/15  Yes Truman Haywardakia S Starkes, FNP  Multiple Vitamin (MULTIVITAMIN) tablet Take 1 tablet by mouth daily.   Yes Historical Provider, MD  SUBOXONE 8-2 MG FILM Take 1 Film by mouth 3 (three) times daily.  10/29/15  Yes Historical Provider, MD  traZODone (DESYREL) 150 MG tablet Take 1 tablet (150 mg total) by mouth at bedtime. Patient taking differently: Take 150 mg by mouth at bedtime as needed for sleep.  11/10/15  Yes Truman Haywardakia S Starkes, FNP  triamcinolone (NASACORT  ALLERGY 24HR) 55 MCG/ACT AERO nasal inhaler Place 2 sprays into the nose daily as needed (for allergies).   Yes Historical Provider, MD   BP 104/58 mmHg  Pulse 91  Temp(Src) 97.8 F (36.6 C) (Oral)  Resp 18  SpO2 100% Physical Exam  Constitutional: He appears well-developed and well-nourished. No distress.  HENT:  Head: Normocephalic and atraumatic.  Mouth/Throat: Oropharynx is clear and moist. No oropharyngeal exudate.  Eyes: Conjunctivae and EOM are normal. Pupils are equal, round, and reactive to light. Right eye exhibits no discharge. Left eye exhibits no discharge. No scleral icterus.  Neck: Normal range of motion. Neck supple. No JVD present. No  thyromegaly present.  Cardiovascular: Normal rate, regular rhythm, normal heart sounds and intact distal pulses.  Exam reveals no gallop and no friction rub.   No murmur heard. Pulmonary/Chest: Effort normal and breath sounds normal. No respiratory distress. He has no wheezes. He has no rales.  Abdominal: Soft. Bowel sounds are normal. He exhibits no distension and no mass. There is no tenderness.  Musculoskeletal: Normal range of motion. He exhibits no edema or tenderness.  Lymphadenopathy:    He has no cervical adenopathy.  Neurological: He is alert. Coordination normal.  Skin: Skin is warm and dry. No rash noted. No erythema.  Psychiatric: He has a normal mood and affect. His behavior is normal.  Flat and depressed affect, no hallucinations, suicidal  Nursing note and vitals reviewed.   ED Course  Procedures (including critical care time) Labs Review Labs Reviewed  COMPREHENSIVE METABOLIC PANEL - Abnormal; Notable for the following:    Glucose, Bld 113 (*)    Calcium 8.8 (*)    Total Protein 6.4 (*)    ALT 16 (*)    All other components within normal limits  ACETAMINOPHEN LEVEL - Abnormal; Notable for the following:    Acetaminophen (Tylenol), Serum <10 (*)    All other components within normal limits  URINE RAPID DRUG SCREEN, HOSP PERFORMED - Abnormal; Notable for the following:    Cocaine POSITIVE (*)    Tetrahydrocannabinol POSITIVE (*)    All other components within normal limits  ETHANOL  SALICYLATE LEVEL  CBC    Imaging Review No results found. I have personally reviewed and evaluated these images and lab results as part of my medical decision-making.    MDM   Final diagnoses:  None    Psychiatric consult, medically the patient appears stable, labs resulted and show that the patient has a normal metabolic panel, no salicylate or acetaminophen and normal blood counts. Urine drug screen pending, TTS consult pending   Pt transferred to Pod C pending Psych  eval  Eber HongBrian Shiva Karis, MD 12/24/15 1513

## 2015-12-24 MED ORDER — HYDROXYZINE HCL 25 MG PO TABS
25.0000 mg | ORAL_TABLET | Freq: Four times a day (QID) | ORAL | Status: DC | PRN
  Administered 2015-12-24: 25 mg via ORAL
  Filled 2015-12-24: qty 1

## 2015-12-24 MED ORDER — DICYCLOMINE HCL 20 MG PO TABS: 20.0000 mg | ORAL_TABLET | Freq: Four times a day (QID) | ORAL | Status: DC | PRN

## 2015-12-24 MED ORDER — LOPERAMIDE HCL 2 MG PO CAPS: 2.0000 mg | ORAL_CAPSULE | ORAL | Status: DC | PRN

## 2015-12-24 MED ORDER — METHOCARBAMOL 500 MG PO TABS: 500.0000 mg | ORAL_TABLET | Freq: Three times a day (TID) | ORAL | Status: DC | PRN

## 2015-12-24 MED ORDER — NAPROXEN 250 MG PO TABS: 500.0000 mg | ORAL_TABLET | Freq: Two times a day (BID) | ORAL | Status: DC | PRN

## 2015-12-24 NOTE — ED Notes (Signed)
Patient able to ambulate independently  

## 2015-12-24 NOTE — [facility] Counselor (Addendum)
[facility] Assessment Progress Note  Pt re-assessed today at 1546. Patient was oriented x 4 and presented with pleasant, calm, euthymic mood. Pt shared that he needed to come into the hospital for detox and knew that the only way he would be able to get detoxed was if he said he was suicidal. Pt indicated that he was "pretty much detoxed" and denied SI/HI/AVH. When asked what he would do if/when d/c'd, pt replied that he would "follow up with my psychiatrist and go home and stay with my family". Pt stated that he felt safe to be d/c and indicated that he was aware he could return to the ED if he started to feel unsafe due to SI, HI, or AVH.   Per consult with Dr. Lucianne MussKumar, pt can be d/c.   Johny ShockSamantha M. Ladona Ridgelaylor, MS, NCC, LPCA Counselor

## 2015-12-24 NOTE — BH Assessment (Addendum)
Writer was informed that the patient will need to be seen by a extender due to the patient requesting to go home, AMA.   Patient now denying SI/HI/Psychosis, per RN  Patient will re assessed by by the NP -Vernona RiegerLaura.

## 2015-12-24 NOTE — ED Notes (Signed)
This RN notified by TTS staff that pt okay for discharge.  MD made aware.

## 2015-12-24 NOTE — ED Notes (Addendum)
Pt up to restroom.  Gait steady and even.  Pt provided with Sprite and Snack.  Pt requesting to take a shower

## 2015-12-24 NOTE — ED Notes (Signed)
TTs updated on patient admission of lying, states TTS will still occur.

## 2015-12-24 NOTE — ED Provider Notes (Signed)
4:08 PM Patient cleared for d/c by psych. Denies SI. States he said that to get in for detox. Has a psychiatrist as outpatient. Encouraged close f/u. Discussed return precautions.  Pricilla LovelessScott Abir Eroh, MD 12/24/15 [phone]

## 2015-12-24 NOTE — ED Notes (Signed)
MD at bedside updating pt

## 2015-12-24 NOTE — ED Provider Notes (Signed)
Sleeping comfortably. Results for orders placed or performed during the hospital encounter of 12/23/15  Comprehensive metabolic panel  Result Value Ref Range   Sodium 140 135 - 145 mmol/L   Potassium 3.8 3.5 - 5.1 mmol/L   Chloride 106 101 - 111 mmol/L   CO2 29 22 - 32 mmol/L   Glucose, Bld 113 (H) 65 - 99 mg/dL   BUN 9 6 - 20 mg/dL   Creatinine, Ser 8.111.05 0.61 - 1.24 mg/dL   Calcium 8.8 (L) 8.9 - 10.3 mg/dL   Total Protein 6.4 (L) 6.5 - 8.1 g/dL   Albumin 4.0 3.5 - 5.0 g/dL   AST 21 15 - 41 U/L   ALT 16 (L) 17 - 63 U/L   Alkaline Phosphatase 44 38 - 126 U/L   Total Bilirubin 0.8 0.3 - 1.2 mg/dL   GFR calc non Af Amer >60 >60 mL/min   GFR calc Af Amer >60 >60 mL/min   Anion gap 5 5 - 15  Ethanol  Result Value Ref Range   Alcohol, Ethyl (B) <5 <5 mg/dL  Salicylate level  Result Value Ref Range   Salicylate Lvl <4.0 2.8 - 30.0 mg/dL  Acetaminophen level  Result Value Ref Range   Acetaminophen (Tylenol), Serum <10 (L) 10 - 30 ug/mL  cbc  Result Value Ref Range   WBC 6.8 4.0 - 10.5 K/uL   RBC 4.76 4.22 - 5.81 MIL/uL   Hemoglobin 14.6 13.0 - 17.0 g/dL   HCT 91.445.0 78.239.0 - 95.652.0 %   MCV 94.5 78.0 - 100.0 fL   MCH 30.7 26.0 - 34.0 pg   MCHC 32.4 30.0 - 36.0 g/dL   RDW 21.313.3 08.611.5 - 57.815.5 %   Platelets 244 150 - 400 K/uL  Rapid urine drug screen (hospital performed)  Result Value Ref Range   Opiates NONE DETECTED NONE DETECTED   Cocaine POSITIVE (A) NONE DETECTED   Benzodiazepines NONE DETECTED NONE DETECTED   Amphetamines NONE DETECTED NONE DETECTED   Tetrahydrocannabinol POSITIVE (A) NONE DETECTED   Barbiturates NONE DETECTED NONE DETECTED   No results found.   Doug SouSam Dhanya Bogle, MD 12/24/15 [phone]

## 2015-12-24 NOTE — ED Notes (Signed)
Snack and drink was placed on patient beside table. Lunch was ordered.

## 2015-12-24 NOTE — ED Notes (Signed)
[facility] to [facility], RN, pt resting comfortable on bed, rails up, sitter at the bedside.

## 2015-12-24 NOTE — ED Notes (Signed)
TTS machine at bedside. 

## 2015-12-24 NOTE — ED Notes (Signed)
Patient admits to lying about being suicidal to be treated for his withdrawal.

## 2015-12-31 MED FILL — SUBOXONE 8 MG-2 MG SL FILM: 8-2 | 14 days supply | Qty: 42 | Fill #0

## 2016-02-21 ENCOUNTER — Encounter (HOSPITAL_COMMUNITY): Payer: Self-pay

## 2016-02-21 DIAGNOSIS — Z87891 Personal history of nicotine dependence: Secondary | ICD-10-CM | POA: Diagnosis not present

## 2016-02-21 DIAGNOSIS — I10 Essential (primary) hypertension: Secondary | ICD-10-CM | POA: Insufficient documentation

## 2016-02-21 DIAGNOSIS — F909 Attention-deficit hyperactivity disorder, unspecified type: Secondary | ICD-10-CM | POA: Insufficient documentation

## 2016-02-21 DIAGNOSIS — Z5321 Procedure and treatment not carried out due to patient leaving prior to being seen by health care provider: Secondary | ICD-10-CM | POA: Insufficient documentation

## 2016-02-21 DIAGNOSIS — F410 Panic disorder [episodic paroxysmal anxiety] without agoraphobia: Secondary | ICD-10-CM | POA: Diagnosis not present

## 2016-02-21 DIAGNOSIS — Z7982 Long term (current) use of aspirin: Secondary | ICD-10-CM | POA: Diagnosis not present

## 2016-02-21 LAB — CBC
HEMATOCRIT: 43.7 % (ref 39.0–52.0)
HEMOGLOBIN: 14.5 g/dL (ref 13.0–17.0)
MCH: 31.5 pg (ref 26.0–34.0)
MCHC: 33.2 g/dL (ref 30.0–36.0)
MCV: 94.8 fL (ref 78.0–100.0)
PLATELETS: 211 10*3/uL (ref 150–400)
RBC: 4.61 MIL/uL (ref 4.22–5.81)
RDW: 12.9 % (ref 11.5–15.5)
WBC: 14.2 10*3/uL — ABNORMAL HIGH (ref 4.0–10.5)

## 2016-02-21 NOTE — ED Triage Notes (Signed)
Pt states that he started having a panic attack around 5pm, unable to control at home. Pt states that his chest is starting to hurt. Denies any stressors today, states that he is "transitioning" and they may have lead to his panic attack.

## 2016-02-22 ENCOUNTER — Emergency Department (HOSPITAL_COMMUNITY)
Admission: EM | Admit: 2016-02-22 | Discharge: 2016-02-22 | Disposition: A | Discharge status: Home / Self Care | Payer: Medicaid Other | Attending: Emergency Medicine | Admitting: Emergency Medicine

## 2016-02-22 LAB — COMPREHENSIVE METABOLIC PANEL
ALK PHOS: 45 U/L (ref 38–126)
ALT: 18 U/L (ref 17–63)
AST: 23 U/L (ref 15–41)
Albumin: 4.4 g/dL (ref 3.5–5.0)
Anion gap: 10 (ref 5–15)
BUN: 7 mg/dL (ref 6–20)
CALCIUM: 9.3 mg/dL (ref 8.9–10.3)
CHLORIDE: 97 mmol/L — AB (ref 101–111)
CO2: 29 mmol/L (ref 22–32)
CREATININE: 1.12 mg/dL (ref 0.61–1.24)
Glucose, Bld: 109 mg/dL — ABNORMAL HIGH (ref 65–99)
Potassium: 3.5 mmol/L (ref 3.5–5.1)
Sodium: 136 mmol/L (ref 135–145)
Total Bilirubin: 0.6 mg/dL (ref 0.3–1.2)
Total Protein: 6.9 g/dL (ref 6.5–8.1)

## 2016-02-22 LAB — RAPID URINE DRUG SCREEN, HOSP PERFORMED
AMPHETAMINES: NOT DETECTED
Barbiturates: NOT DETECTED
Benzodiazepines: NOT DETECTED
Cocaine: POSITIVE — AB
OPIATES: NOT DETECTED
TETRAHYDROCANNABINOL: NOT DETECTED

## 2016-02-22 LAB — ETHANOL

## 2016-02-22 LAB — TROPONIN I: TROPONIN I: 0.08 ng/mL — AB (ref <0.03)

## 2016-03-02 ENCOUNTER — Emergency Department (HOSPITAL_COMMUNITY)
Admission: EM | Admit: 2016-03-02 | Discharge: 2016-03-02 | Disposition: A | Discharge status: Home / Self Care | Payer: Medicaid Other | Attending: Emergency Medicine | Admitting: Emergency Medicine

## 2016-03-02 ENCOUNTER — Encounter (HOSPITAL_COMMUNITY): Payer: Self-pay | Admitting: Emergency Medicine

## 2016-03-02 DIAGNOSIS — F1410 Cocaine abuse, uncomplicated: Secondary | ICD-10-CM | POA: Diagnosis not present

## 2016-03-02 DIAGNOSIS — Z7982 Long term (current) use of aspirin: Secondary | ICD-10-CM | POA: Insufficient documentation

## 2016-03-02 DIAGNOSIS — F1123 Opioid dependence with withdrawal: Secondary | ICD-10-CM | POA: Insufficient documentation

## 2016-03-02 DIAGNOSIS — F10230 Alcohol dependence with withdrawal, uncomplicated: Secondary | ICD-10-CM

## 2016-03-02 DIAGNOSIS — F10239 Alcohol dependence with withdrawal, unspecified: Secondary | ICD-10-CM | POA: Insufficient documentation

## 2016-03-02 DIAGNOSIS — F1910 Other psychoactive substance abuse, uncomplicated: Secondary | ICD-10-CM

## 2016-03-02 DIAGNOSIS — I10 Essential (primary) hypertension: Secondary | ICD-10-CM | POA: Insufficient documentation

## 2016-03-02 DIAGNOSIS — F909 Attention-deficit hyperactivity disorder, unspecified type: Secondary | ICD-10-CM | POA: Insufficient documentation

## 2016-03-02 DIAGNOSIS — Z79899 Other long term (current) drug therapy: Secondary | ICD-10-CM | POA: Insufficient documentation

## 2016-03-02 DIAGNOSIS — F1193 Opioid use, unspecified with withdrawal: Secondary | ICD-10-CM

## 2016-03-02 DIAGNOSIS — F1210 Cannabis abuse, uncomplicated: Secondary | ICD-10-CM | POA: Diagnosis not present

## 2016-03-02 DIAGNOSIS — F10930 Alcohol use, unspecified with withdrawal, uncomplicated: Secondary | ICD-10-CM

## 2016-03-02 DIAGNOSIS — F17210 Nicotine dependence, cigarettes, uncomplicated: Secondary | ICD-10-CM | POA: Diagnosis not present

## 2016-03-02 LAB — COMPREHENSIVE METABOLIC PANEL
ALBUMIN: 4.2 g/dL (ref 3.5–5.0)
ALT: 17 U/L (ref 17–63)
AST: 17 U/L (ref 15–41)
Alkaline Phosphatase: 36 U/L — ABNORMAL LOW (ref 38–126)
Anion gap: 4 — ABNORMAL LOW (ref 5–15)
BUN: 9 mg/dL (ref 6–20)
CO2: 27 mmol/L (ref 22–32)
Calcium: 8.8 mg/dL — ABNORMAL LOW (ref 8.9–10.3)
Chloride: 109 mmol/L (ref 101–111)
Creatinine, Ser: 0.87 mg/dL (ref 0.61–1.24)
GFR calc Af Amer: >60 mL/min (ref >60)
GFR calc non Af Amer: >60 mL/min (ref >60)
Glucose, Bld: 84 mg/dL (ref 65–99)
POTASSIUM: 3.4 mmol/L — AB (ref 3.5–5.1)
Sodium: 140 mmol/L (ref 135–145)
Total Bilirubin: 0.5 mg/dL (ref 0.3–1.2)
Total Protein: 6.2 g/dL — ABNORMAL LOW (ref 6.5–8.1)

## 2016-03-02 LAB — RAPID URINE DRUG SCREEN, HOSP PERFORMED
Amphetamines: NOT DETECTED
BARBITURATES: NOT DETECTED
BENZODIAZEPINES: NOT DETECTED
Cocaine: POSITIVE — AB
Opiates: POSITIVE — AB
Tetrahydrocannabinol: POSITIVE — AB

## 2016-03-02 LAB — CBC
HCT: 40.1 % (ref 39.0–52.0)
Hemoglobin: 13.3 g/dL (ref 13.0–17.0)
MCH: 31.7 pg (ref 26.0–34.0)
MCHC: 33.2 g/dL (ref 30.0–36.0)
MCV: 95.5 fL (ref 78.0–100.0)
PLATELETS: 227 10*3/uL (ref 150–400)
RBC: 4.2 MIL/uL — ABNORMAL LOW (ref 4.22–5.81)
RDW: 13.5 % (ref 11.5–15.5)
WBC: 10.5 10*3/uL (ref 4.0–10.5)

## 2016-03-02 LAB — ETHANOL: Alcohol, Ethyl (B): <5 mg/dL (ref <5)

## 2016-03-02 NOTE — ED Triage Notes (Signed)
Pt requesting detox from etoh, heroin, and "all pain medicines."  Last drank etoh and used heroin yesterday.  Took Vicodin 3 hours ago.  States he was in rehab last year for same.  Denies suicidal/homicidal ideation.

## 2016-03-02 NOTE — ED Provider Notes (Signed)
[facility]-EMERGENCY DEPT Provider Note   CSN: [id number] Arrival date & time: 03/02/16  1912     History   Chief Complaint Chief Complaint  Patient presents with  . Alcohol Problem  . Drug Problem    HPI [name] is a 40 y.o. male.  Pt presents with a desire to detox from opiod pills and alcohol. Patient reports last use was yesterday. Feel "terrible" with diarrhea, abdominal pain, flu-like symptoms. HDS w/ no tachy cardia, normotensive, afebrile. Denies CP, SOB. No h/o intravenous drug use.      Past Medical History:  Diagnosis Date  . Abdominal pain, recurrent    chronic lower abdominal pain  . ADHD (attention deficit hyperactivity disorder)   . Anxiety   . BPH (benign prostatic hyperplasia)   . Chronic pain   . Depression   . Hypertension   . Narcotic abuse    opiates    Patient Active Problem List   Diagnosis Date Noted  . Cocaine-induced mood disorder (HCC) 11/29/2015  . Cocaine use disorder, moderate, dependence (HCC) 11/05/2015  . Cannabis use disorder, moderate, dependence (HCC) 11/05/2015  . Attention deficit hyperactivity disorder (ADHD) 11/05/2015  . Opioid use disorder, moderate, in early remission, on maintenance therapy 11/05/2015  . MDD (major depressive disorder), recurrent, severe, with psychosis (HCC) 11/05/2015    Past Surgical History:  Procedure Laterality Date  . APPENDECTOMY  08/2005       Home Medications    Prior to Admission medications   Medication Sig Start Date End Date Taking? Authorizing Provider  ARIPiprazole (ABILIFY) 15 MG tablet Take 1 tablet (15 mg total) by mouth at bedtime. Patient taking differently: Take 20 mg by mouth at bedtime.  11/10/15   Truman Haywardakia S Starkes, FNP  aspirin EC 81 MG tablet Take 81 mg by mouth at bedtime.     Historical Provider, MD  benztropine (COGENTIN) 0.5 MG tablet Take 1 tablet (0.5 mg total) by mouth at bedtime. 11/10/15   Truman Haywardakia S Starkes, FNP  clonazePAM (KLONOPIN) 0.5 MG tablet Take 0.5 mg by  mouth 2 (two) times daily.  11/21/15   Historical Provider, MD  FLUoxetine (PROZAC) 20 MG capsule Take 3 capsules (60 mg total) by mouth daily. 11/10/15   Truman Haywardakia S Starkes, FNP  methylphenidate (RITALIN) 10 MG tablet Take 2 tablets (20 mg total) by mouth 2 (two) times daily with breakfast and lunch. 11/10/15   Truman Haywardakia S Starkes, FNP  Multiple Vitamin (MULTIVITAMIN) tablet Take 1 tablet by mouth daily.    Historical Provider, MD  SUBOXONE 8-2 MG FILM Take 1 Film by mouth 3 (three) times daily.  10/29/15   Historical Provider, MD  traZODone (DESYREL) 150 MG tablet Take 1 tablet (150 mg total) by mouth at bedtime. Patient taking differently: Take 150 mg by mouth at bedtime as needed for sleep.  11/10/15   Truman Haywardakia S Starkes, FNP  triamcinolone (NASACORT ALLERGY 24HR) 55 MCG/ACT AERO nasal inhaler Place 2 sprays into the nose daily as needed (for allergies).    Historical Provider, MD    Family History Family History  Problem Relation Age of Onset  . Hypertension Father   . Prostate cancer Other   . Mental illness Maternal Aunt   . Drug abuse Maternal Aunt     Social History Social History  Substance Use Topics  . Smoking status: Current Every Day Smoker    Packs/day: 0.00    Types: Cigarettes  . Smokeless tobacco: Never Used  . Alcohol use Yes  Allergies   Escitalopram oxalate and Contrast media [iodinated diagnostic agents]   Review of Systems Review of Systems  Constitutional: Positive for diaphoresis and fatigue. Negative for chills and fever.  Eyes: Negative for visual disturbance.  Respiratory: Negative for cough and shortness of breath.   Cardiovascular: Positive for palpitations. Negative for chest pain and leg swelling.  Gastrointestinal: Positive for abdominal pain and diarrhea. Negative for constipation, nausea and vomiting.  Genitourinary: Negative for dysuria and hematuria.  Musculoskeletal: Positive for myalgias. Negative for arthralgias.  Skin: Negative for rash and  wound.  Neurological: Positive for weakness (generalized). Negative for dizziness and light-headedness.  Psychiatric/Behavioral: Negative for agitation, confusion and hallucinations.     Physical Exam Updated Vital Signs BP 120/68 (BP Location: Left Arm)   Pulse 93   Temp 98.2 F (36.8 C) (Oral)   Resp 18   Ht 5\' 8"  (1.727 m)   Wt 70.3 kg   SpO2 96%   BMI 23.57 kg/m   Physical Exam  Constitutional: He is oriented to person, place, and time. He appears well-developed and well-nourished. No distress.  HENT:  Head: Normocephalic and atraumatic.  Mouth/Throat: Oropharynx is clear and moist. No oropharyngeal exudate.  Eyes: Conjunctivae are normal. Right pupil is round and reactive. Left pupil is round and reactive.  6mm to 4mm  Neck: Neck supple.  Cardiovascular: Normal rate and regular rhythm.   No murmur heard. Pulmonary/Chest: Effort normal and breath sounds normal. No respiratory distress. He has no wheezes. He exhibits no tenderness.  Abdominal: Soft. Bowel sounds are normal. He exhibits no distension. There is tenderness.  Musculoskeletal: He exhibits no edema.  Neurological: He is alert and oriented to person, place, and time.  Skin: Skin is warm and dry. He is not diaphoretic.  Psychiatric: He has a normal mood and affect. His speech is normal and behavior is normal. Thought content normal. His mood appears not anxious. Cognition and memory are normal. He does not exhibit a depressed mood. He is attentive.  Nursing note and vitals reviewed.   ED Treatments / Results  Labs (all labs ordered are listed, but only abnormal results are displayed) Labs Reviewed  COMPREHENSIVE METABOLIC PANEL - Abnormal; Notable for the following:       Result Value   Potassium 3.4 (*)    Calcium 8.8 (*)    Total Protein 6.2 (*)    Alkaline Phosphatase 36 (*)    Anion gap 4 (*)    All other components within normal limits  URINE RAPID DRUG SCREEN, HOSP PERFORMED - Abnormal; Notable for  the following:    Opiates POSITIVE (*)    Cocaine POSITIVE (*)    Tetrahydrocannabinol POSITIVE (*)    All other components within normal limits  CBC - Abnormal; Notable for the following:    RBC 4.20 (*)    All other components within normal limits  ETHANOL    EKG  EKG Interpretation None       Radiology No results found.  Procedures Procedures (including critical care time)  Medications Ordered in ED Medications - No data to display   Initial Impression / Assessment and Plan / ED Course  I have reviewed the triage vital signs and the nursing notes.  Pertinent labs & imaging results that were available during my care of the patient were reviewed by me and considered in my medical decision making (see chart for details).  Clinical Course   Alcohol and opiate abuse. Clinically stable from a medical standpoint. Will  discharge with resources for addition rehab and social services.  Final Clinical Impressions(s) / ED Diagnoses   Final diagnoses:  Polysubstance abuse  Opiate withdrawal (HCC)  Alcohol withdrawal, uncomplicated [facility])   New Prescriptions New Prescriptions   No medications on file     Carolynn Comment, MD 03/02/16 2315    Jacalyn Lefevre, MD 03/02/16 [phone]

## 2017-05-18 ENCOUNTER — Emergency Department (HOSPITAL_COMMUNITY): Admission: EM | Admit: 2017-05-18 | Discharge: 2017-05-18 | Discharge status: Against Medical Advice | Payer: Self-pay

## 2017-05-18 NOTE — ED Notes (Signed)
Called for assigned triage room with no answer.  

## 2017-06-04 ENCOUNTER — Encounter (HOSPITAL_COMMUNITY): Payer: Self-pay | Admitting: *Deleted

## 2017-06-04 ENCOUNTER — Other Ambulatory Visit: Payer: Self-pay

## 2017-06-04 DIAGNOSIS — Y998 Other external cause status: Secondary | ICD-10-CM | POA: Diagnosis not present

## 2017-06-04 DIAGNOSIS — W14XXXA Fall from tree, initial encounter: Secondary | ICD-10-CM | POA: Diagnosis not present

## 2017-06-04 DIAGNOSIS — F1410 Cocaine abuse, uncomplicated: Secondary | ICD-10-CM | POA: Insufficient documentation

## 2017-06-04 DIAGNOSIS — S8001XA Contusion of right knee, initial encounter: Secondary | ICD-10-CM | POA: Insufficient documentation

## 2017-06-04 DIAGNOSIS — Z79899 Other long term (current) drug therapy: Secondary | ICD-10-CM | POA: Insufficient documentation

## 2017-06-04 DIAGNOSIS — F1110 Opioid abuse, uncomplicated: Secondary | ICD-10-CM | POA: Diagnosis not present

## 2017-06-04 DIAGNOSIS — Y92838 Other recreation area as the place of occurrence of the external cause: Secondary | ICD-10-CM | POA: Insufficient documentation

## 2017-06-04 DIAGNOSIS — T07XXXA Unspecified multiple injuries, initial encounter: Secondary | ICD-10-CM | POA: Insufficient documentation

## 2017-06-04 DIAGNOSIS — M25461 Effusion, right knee: Secondary | ICD-10-CM | POA: Insufficient documentation

## 2017-06-04 DIAGNOSIS — F17210 Nicotine dependence, cigarettes, uncomplicated: Secondary | ICD-10-CM | POA: Insufficient documentation

## 2017-06-04 DIAGNOSIS — M25561 Pain in right knee: Secondary | ICD-10-CM | POA: Diagnosis not present

## 2017-06-04 DIAGNOSIS — Z7982 Long term (current) use of aspirin: Secondary | ICD-10-CM | POA: Diagnosis not present

## 2017-06-04 DIAGNOSIS — Y9389 Activity, other specified: Secondary | ICD-10-CM | POA: Diagnosis not present

## 2017-06-04 DIAGNOSIS — I10 Essential (primary) hypertension: Secondary | ICD-10-CM | POA: Diagnosis not present

## 2017-06-04 MED ORDER — OXYCODONE-ACETAMINOPHEN 5-325 MG PO TABS
1.0000 | ORAL_TABLET | ORAL | Status: DC | PRN
  Administered 2017-06-04: 1 via ORAL
  Filled 2017-06-04: qty 1

## 2017-06-04 NOTE — ED Triage Notes (Signed)
Did not hit his head, no LOC

## 2017-06-04 NOTE — ED Notes (Signed)
Patient educated about not driving or performing other critical tasks (such as operating heavy machinery, caring for infant/toddler/child) due to sedative nature of narcotic medications received while in the ED.  Pt/caregiver verbalized understanding.   

## 2017-06-04 NOTE — ED Triage Notes (Signed)
Pt states around 1930 he fell out of a deer stand into a briar patch, approx 10 feet.  He c/o pain in the right knee, multiple abrasions from the briars. Took aleve for the pain.

## 2017-06-05 ENCOUNTER — Emergency Department (HOSPITAL_COMMUNITY): Payer: Medicaid Other

## 2017-06-05 ENCOUNTER — Emergency Department (HOSPITAL_COMMUNITY)
Admission: EM | Admit: 2017-06-05 | Discharge: 2017-06-05 | Disposition: A | Discharge status: Home / Self Care | Payer: Medicaid Other | Attending: Emergency Medicine | Admitting: Emergency Medicine

## 2017-06-05 DIAGNOSIS — T07XXXA Unspecified multiple injuries, initial encounter: Secondary | ICD-10-CM

## 2017-06-05 DIAGNOSIS — M25561 Pain in right knee: Secondary | ICD-10-CM

## 2017-06-05 DIAGNOSIS — M25469 Effusion, unspecified knee: Secondary | ICD-10-CM

## 2017-06-05 DIAGNOSIS — S8001XA Contusion of right knee, initial encounter: Secondary | ICD-10-CM

## 2017-06-05 DIAGNOSIS — W19XXXA Unspecified fall, initial encounter: Secondary | ICD-10-CM

## 2017-06-05 MED ORDER — MORPHINE SULFATE (PF) 4 MG/ML IV SOLN
4.0000 mg | Freq: Once | INTRAVENOUS | Status: AC
  Administered 2017-06-05: 4 mg via INTRAVENOUS
  Filled 2017-06-05: qty 1

## 2017-06-05 NOTE — Discharge Instructions (Addendum)
Wear knee sleeve for at least 2 weeks for stabilization of knee. Use crutches as needed for comfort. Ice and elevate knee throughout the day, using ice pack for no more than 20 minutes every hour. Alternate between tylenol and motrin as needed for pain relief. Call orthopedic follow up today or tomorrow to schedule followup appointment for recheck of ongoing knee pain in 1-2 weeks that can be canceled with a 24-48 hour notice if complete resolution of pain. Return to the ER for changes or worsening symptoms. °

## 2017-06-05 NOTE — ED Notes (Signed)
Pt given sandwich meal, coke upon request.

## 2017-06-05 NOTE — ED Provider Notes (Signed)
[facility]ENC[facility] Provider Note   CSN: [id number] Arrival date & time: 06/04/17  2346     History   Chief Complaint Chief Complaint  Patient presents with  . Knee Pain    HPI [name] is a 41 y.o. male with a PMHx of HTN, narcotic abuse, chronic pain, ADHD, anxiety, BPH, polysubstance abuse, and other conditions listed below, who presents to the ED with complaints of right knee pain x1 day.  Patient states that on Wednesday night, 1 day prior to arrival in the ER, he was in a tree stand when he accidentally fell approximately 8 feet down into a briar patch, landing on his right knee.  He denies head injury or LOC during the incident.  He describes his pain as 8/10 constant throbbing nonradiating right knee pain that worsens with bending his knee and weightbearing through the leg, and has been unrelieved with Aleve and Advil.  He reports right knee swelling and bruising as well.  He also sustained multiple abrasions all over his legs and arms from the briar patch.  His last tetanus was last year.  He has previously been under orthopedic care for his other leg, [facility].  He has never injured his right knee before.  He denies erythema or warmth of the knee, head injury, LOC, numbness, tingling, focal weakness, neck/back pain, other injuries sustained during the incident, or any recent fevers, chills, CP, SOB, abd pain, N/V/D/C, hematuria, dysuria, or any other complaints at this time.    The history is provided by the patient and medical records. No language interpreter was used.  Knee Pain   This is a new problem. The current episode started yesterday. The problem occurs constantly. The problem has not changed since onset.The pain is present in the right knee. Quality: throbbing. The pain is at a severity of 8/10. The pain is moderate. Pertinent negatives include no numbness and no tingling. The symptoms are aggravated by  activity and standing (bending knee, standing, etc). He has tried OTC pain medications for the symptoms. The treatment provided no relief. There has been a history of trauma.    Past Medical History:  Diagnosis Date  . Abdominal pain, recurrent    chronic lower abdominal pain  . ADHD (attention deficit hyperactivity disorder)   . Anxiety   . BPH (benign prostatic hyperplasia)   . Chronic pain   . Depression   . Hypertension   . Narcotic abuse (HCC)    opiates    Patient Active Problem List   Diagnosis Date Noted  . Cocaine-induced mood disorder (HCC) 11/29/2015  . Cocaine use disorder, moderate, dependence (HCC) 11/05/2015  . Cannabis use disorder, moderate, dependence (HCC) 11/05/2015  . Attention deficit hyperactivity disorder (ADHD) 11/05/2015  . Opioid use disorder, moderate, in early remission, on maintenance therapy (HCC) 11/05/2015  . MDD (major depressive disorder), recurrent, severe, with psychosis (HCC) 11/05/2015    Past Surgical History:  Procedure Laterality Date  . APPENDECTOMY  08/2005       Home Medications    Prior to Admission medications   Medication Sig Start Date End Date Taking? Authorizing Provider  ARIPiprazole (ABILIFY) 15 MG tablet Take 1 tablet (15 mg total) by mouth at bedtime. Patient taking differently: Take 20 mg by mouth at bedtime.  11/10/15   Truman HaywardStarkes, Takia S, FNP  aspirin EC 81 MG tablet Take 81 mg by mouth at bedtime.     [provider]  benztropine (COGENTIN) 0.5 MG tablet Take 1 tablet (0.5 mg total) by mouth at bedtime. 11/10/15   Truman HaywardStarkes, Takia S, FNP  clonazePAM (KLONOPIN) 0.5 MG tablet Take 0.5 mg by mouth 2 (two) times daily.  11/21/15   [provider]  FLUoxetine (PROZAC) 20 MG capsule Take 3 capsules (60 mg total) by mouth daily. 11/10/15   Truman HaywardStarkes, Takia S, FNP  methylphenidate (RITALIN) 10 MG tablet Take 2 tablets (20 mg total) by mouth 2 (two) times daily with breakfast and lunch. 11/10/15   Truman HaywardStarkes, Takia S,  FNP  Multiple Vitamin (MULTIVITAMIN) tablet Take 1 tablet by mouth daily.    [provider]  SUBOXONE 8-2 MG FILM Take 1 Film by mouth 3 (three) times daily.  10/29/15   [provider]  traZODone (DESYREL) 150 MG tablet Take 1 tablet (150 mg total) by mouth at bedtime. Patient taking differently: Take 150 mg by mouth at bedtime as needed for sleep.  11/10/15   Truman HaywardStarkes, Takia S, FNP  triamcinolone (NASACORT ALLERGY 24HR) 55 MCG/ACT AERO nasal inhaler Place 2 sprays into the nose daily as needed (for allergies).    [provider]    Family History Family History  Problem Relation Age of Onset  . Hypertension Father   . Prostate cancer Other   . Mental illness Maternal Aunt   . Drug abuse Maternal Aunt     Social History Social History   Tobacco Use  . Smoking status: Current Every Day Smoker    Packs/day: 0.00    Types: Cigarettes  . Smokeless tobacco: Never Used  Substance Use Topics  . Alcohol use: Yes  . Drug use: Yes    Types: Oxycodone, Opium, Cocaine    Comment: Heroin, "pain pills"     Allergies   Escitalopram oxalate and Contrast media [iodinated diagnostic agents]   Review of Systems Review of Systems  Constitutional: Negative for chills and fever.  HENT: Negative for facial swelling (no head inj).   Respiratory: Negative for shortness of breath.   Cardiovascular: Negative for chest pain.  Gastrointestinal: Negative for abdominal pain, constipation, diarrhea, nausea and vomiting.  Genitourinary: Negative for dysuria and hematuria.  Musculoskeletal: Positive for arthralgias and joint swelling. Negative for back pain and neck pain.  Skin: Positive for color change (bruising R knee, no erythema/warmth) and wound (abrasions from briar patch).  Allergic/Immunologic: Negative for immunocompromised state.  Neurological: Negative for tingling, syncope, weakness and numbness.  Psychiatric/Behavioral: Negative for confusion.   All other  systems reviewed and are negative for acute change except as noted in the HPI.    Physical Exam Updated Vital Signs BP 116/70   Pulse (!) 107   Temp 98.1 F (36.7 C)   Resp 16   SpO2 96%    Physical Exam  Constitutional: He is oriented to person, place, and time. Vital signs are normal. He appears well-developed and well-nourished.  Non-toxic appearance. No distress.  Afebrile, nontoxic, NAD although appears uncomfortable  HENT:  Head: Normocephalic and atraumatic.  Mouth/Throat: Mucous membranes are normal.  Eyes: Conjunctivae and EOM are normal. Right eye exhibits no discharge. Left eye exhibits no discharge.  Neck: Normal range of motion. Neck supple.  Cardiovascular: Intact distal pulses. Tachycardia present.  Slightly tachycardic in low 100s, likely related to pain  Pulmonary/Chest: Effort normal. No respiratory distress.  Abdominal: Normal appearance. He exhibits no distension.  Musculoskeletal: Normal range of motion.       Right knee: He exhibits swelling, effusion and ecchymosis. He  exhibits normal range of motion, no deformity, no laceration, no erythema, normal alignment, no LCL laxity, normal patellar mobility and no MCL laxity. Tenderness found. Medial joint line and lateral joint line tenderness noted.  R knee with fairly full ROM intact although pain elicited during AROM and PROM particularly with knee flexion; diffuse joint line TTP, +swelling/effusion particularly to suprapatellar area, no deformity, +bruising, no erythema or warmth, no abnormal alignment or patellar mobility, no varus/valgus laxity, neg anterior drawer test, no crepitus. Unable to bear more than ~50% of weight through right leg due to pain. Distal strength and sensation grossly intact, distal pulses intact, compartments soft   Neurological: He is alert and oriented to person, place, and time. He has normal strength. No sensory deficit.  Skin: Skin is warm and dry. Abrasion noted. No rash noted.    Scattered superficial abrasions to b/l arms and legs  Psychiatric: He has a normal mood and affect.  Nursing note and vitals reviewed.    ED Treatments / Results  Labs (all labs ordered are listed, but only abnormal results are displayed) Labs Reviewed - No data to display  EKG  EKG Interpretation None       Radiology Ct Knee Right Wo Contrast  Result Date: 06/05/2017 CLINICAL DATA:  41 year old male with fall and right knee pain. EXAM: CT OF THE right KNEE WITHOUT CONTRAST TECHNIQUE: Multidetector CT imaging of the right knee was performed according to the standard protocol. Multiplanar CT image reconstructions were also generated. COMPARISON:  Right knee radiograph dated 06/05/2017 FINDINGS: Bones/Joint/Cartilage There is no acute fracture or dislocation. No arthritic changes. There is a moderate size suprapatellar effusion which demonstrates fluid attenuation. Ligaments Suboptimally assessed by CT. Muscles and Tendons No acute findings. Soft tissues There is edema and fluid in the deep fascia of the calf. No large hematoma or fluid collection. IMPRESSION: 1. No acute fracture or dislocation. 2. Moderate suprapatellar effusion of indeterminate etiology. MRI may provide better evaluation if there is clinical concern for meniscal or ligamentous injury. Electronically Signed   By: Elgie Collard M.D.   On: 06/05/2017 06:48   Dg Knee Complete 4 Views Right  Result Date: 06/05/2017 CLINICAL DATA:  41 year old male with fall and right knee pain. EXAM: RIGHT KNEE - COMPLETE 4+ VIEW COMPARISON:  Right knee radiograph dated 02/07/2006 FINDINGS: There no acute fracture or dislocation. The bones are well mineralized. No arthritic changes. Small suprapatellar effusion noted. The soft tissues appear unremarkable. IMPRESSION: No acute fracture or dislocation. Electronically Signed   By: Elgie Collard M.D.   On: 06/05/2017 00:59    Procedures Procedures (including critical care  time)  Medications Ordered in ED Medications  oxyCODONE-acetaminophen (PERCOCET/ROXICET) 5-325 MG per tablet 1 tablet (1 tablet Oral Given 06/04/17 2358)  morphine 4 MG/ML injection 4 mg (4 mg Intravenous Given 06/05/17 0456)     Initial Impression / Assessment and Plan / ED Course  I have reviewed the triage vital signs and the nursing notes.  Pertinent labs & imaging results that were available during my care of the patient were reviewed by me and considered in my medical decision making (see chart for details).     41 y.o. male here with c/o R knee pain/swelling/bruising after fall 1 day prior to arrival. States he was in a tree stand and fell into a briar patch directly onto his R knee. Difficulty with weight bearing since then. On exam, mild swelling/effusion noted particularly to suprapatellar area, slight bruising, diffuse joint line TTP,  NVI with soft compartments, distal strength intact, pain during PROM and AROM but fairly full ROM intact. Unable to bear more than ~50% of weight through right leg. Multiple scattered superficial abrasions over arms and legs bilaterally, Tdap UTD as of last year. Xray of knee negative aside from small suprapatellar effusion. Given mechanism of injury, and difficulty with weight bearing, will obtain CT knee to r/o occult tibial plateau fx. Will give pain meds and reassess shortly.   6:57 AM Pain improved with morphine, tachycardia resolved. CT knee negative for acute fx, moderate sized suprapatellar effusion noted. Given mechanism, likely knee contusion vs possible meniscal injury; will give knee sleeve for stabilization/comfort, crutches given for comfort, discussed RICE, tylenol/motrin for pain, and f/up with ortho in 1-2wks for recheck of symptoms if ongoing symptoms persist. Of note, [facility] reviewed and suboxone being prescribed by Dr. Edwin Cap regularly until July 2018 (last filling dates: 01/03/17, 01/01/17, 12/22/16, 12/16/16, 12/09/16, 12/02/16, 11/28/16, etc),  and vicodin #12 tabs on 11/11/16, as well as a few adderall and methylphenidate rx's in the last few months; given his hx of narcotic abuse, and his prior suboxone rx's, will not be prescribing any narcotics to go home with. I explained the diagnosis and have given explicit precautions to return to the ER including for any other new or worsening symptoms. The patient understands and accepts the medical plan as it's been dictated and I have answered their questions. Discharge instructions concerning home care and prescriptions have been given. The patient is STABLE and is discharged to home in good condition.    Final Clinical Impressions(s) / ED Diagnoses   Final diagnoses:  Acute pain of right knee  Contusion of right knee, initial encounter  Abrasions of multiple sites  Fall, initial encounter  Suprapatellar effusion of knee    ED Discharge Orders    [address] [place], [place] 06/05/17 1610    Dione Booze, MD 06/05/17 [phone]

## 2017-06-05 NOTE — ED Notes (Signed)
Patient updated on delays 

## 2017-06-05 NOTE — ED Notes (Signed)
Patient ambulating in lobby, gait steady but still limping

## 2017-07-22 ENCOUNTER — Other Ambulatory Visit: Payer: Self-pay

## 2017-07-22 ENCOUNTER — Ambulatory Visit (HOSPITAL_COMMUNITY)
Admission: EM | Admit: 2017-07-22 | Discharge: 2017-07-22 | Disposition: A | Discharge status: Home / Self Care | Payer: Medicaid Other | Attending: Family Medicine | Admitting: Family Medicine

## 2017-07-22 ENCOUNTER — Encounter (HOSPITAL_COMMUNITY): Payer: Self-pay | Admitting: Emergency Medicine

## 2017-07-22 DIAGNOSIS — J209 Acute bronchitis, unspecified: Secondary | ICD-10-CM

## 2017-07-22 MED ORDER — ONDANSETRON HCL 4 MG PO TABS: 4.0000 mg | ORAL_TABLET | Freq: Three times a day (TID) | ORAL | 0 refills | Status: DC | PRN

## 2017-07-22 MED ORDER — DOXYCYCLINE HYCLATE 100 MG PO CAPS: 100.0000 mg | ORAL_CAPSULE | Freq: Two times a day (BID) | ORAL | 0 refills | Status: DC

## 2017-07-22 MED ORDER — HYDROCODONE-HOMATROPINE 5-1.5 MG/5ML PO SYRP: 5.0000 mL | ORAL_SOLUTION | Freq: Four times a day (QID) | ORAL | 0 refills | Status: DC | PRN

## 2017-07-22 MED ORDER — PREDNISONE 10 MG PO TABS: 40.0000 mg | ORAL_TABLET | Freq: Every day | ORAL | 0 refills | Status: DC

## 2017-07-22 NOTE — ED Triage Notes (Signed)
Symptoms started a week ago.  Patient has a cough, phlegm is dark green, chest is sore - deep inspiration makes pain worse.  Reports cough is not responding to any medicine.  Denies fever

## 2017-07-22 NOTE — ED Provider Notes (Signed)
[facility]    CSN: [id number] Arrival date & time: 07/22/17  1834     History   Chief Complaint Chief Complaint  Patient presents with  . Cough    HPI [name] is a 42 y.o. male.   42 year old male, with history of anxiety, depression, hypertension, presenting today complaining of cold symptoms.  Patient states that he has had cough, nasal congestion, sinus pressure, chest pain from coughing over the past week.  He denies any fever or chills.  Denies any shortness of breath.  Denies any known recent sick contacts.  Has tried several over-the-counter cough medications at home without much relief   The history is provided by the patient.  Cough  Cough characteristics:  Productive Sputum characteristics:  Green Severity:  Moderate Onset quality:  Gradual Duration:  1 week Timing:  Constant Progression:  Unchanged Chronicity:  New Smoker: yes   Context: not animal exposure, not exposure to allergens and not fumes   Relieved by:  Nothing Worsened by:  Nothing Ineffective treatments:  Cough suppressants and decongestant Associated symptoms: chest pain, chills and sinus congestion   Associated symptoms: no diaphoresis, no ear fullness, no ear pain, no eye discharge, no fever, no headaches, no myalgias, no rash, no rhinorrhea, no shortness of breath, no sore throat, no weight loss and no wheezing   Risk factors: no chemical exposure, no recent infection and no recent travel     Past Medical History:  Diagnosis Date  . Abdominal pain, recurrent    chronic lower abdominal pain  . ADHD (attention deficit hyperactivity disorder)   . Anxiety   . BPH (benign prostatic hyperplasia)   . Chronic pain   . Depression   . Hypertension   . Narcotic abuse (HCC)    opiates    Patient Active Problem List   Diagnosis Date Noted  . Cocaine-induced mood disorder (HCC) 11/29/2015  . Cocaine use disorder, moderate, dependence (HCC) 11/05/2015  . Cannabis use  disorder, moderate, dependence (HCC) 11/05/2015  . Attention deficit hyperactivity disorder (ADHD) 11/05/2015  . Opioid use disorder, moderate, in early remission, on maintenance therapy (HCC) 11/05/2015  . MDD (major depressive disorder), recurrent, severe, with psychosis (HCC) 11/05/2015    Past Surgical History:  Procedure Laterality Date  . APPENDECTOMY  08/2005       Home Medications    Prior to Admission medications   Medication Sig Start Date End Date Taking? Authorizing Provider  Divalproex Sodium (DEPAKOTE PO) Take by mouth.   Yes [provider]  LamoTRIgine (LAMICTAL PO) Take by mouth.   Yes [provider]  doxycycline (VIBRAMYCIN) 100 MG capsule Take 1 capsule (100 mg total) by mouth 2 (two) times daily. 07/22/17   Shubham Thackston C, PA-C  HYDROcodone-homatropine (HYCODAN) 5-1.5 MG/5ML syrup Take 5 mLs by mouth every 6 (six) hours as needed for cough. 07/22/17   Aster Screws C, PA-C  ondansetron (ZOFRAN) 4 MG tablet Take 1 tablet (4 mg total) by mouth every 8 (eight) hours as needed for nausea or vomiting. 07/22/17   Glendal Cassaday C, PA-C  predniSONE (DELTASONE) 10 MG tablet Take 4 tablets (40 mg total) by mouth daily for 5 days. 07/22/17 07/27/17  Alecia Lemming, PA-C    Family History Family History  Problem Relation Age of Onset  . Hypertension Father   . Prostate cancer Other   . Mental illness Maternal Aunt   . Drug abuse Maternal Aunt     Social History Social History  Tobacco Use  . Smoking status: Current Every Day Smoker    Packs/day: 0.00    Types: Cigarettes  . Smokeless tobacco: Never Used  Substance Use Topics  . Alcohol use: Yes  . Drug use: Yes    Types: Oxycodone, Opium, Cocaine    Comment: Heroin, "pain pills"     Allergies   Escitalopram oxalate and Contrast media [iodinated diagnostic agents]   Review of Systems Review of Systems  Constitutional: Positive for chills. Negative for diaphoresis, fever and weight loss.    HENT: Positive for congestion. Negative for ear pain, rhinorrhea and sore throat.   Eyes: Negative for pain, discharge and visual disturbance.  Respiratory: Positive for cough. Negative for shortness of breath and wheezing.   Cardiovascular: Positive for chest pain. Negative for palpitations.  Gastrointestinal: Negative for abdominal pain and vomiting.  Genitourinary: Negative for dysuria and hematuria.  Musculoskeletal: Negative for arthralgias, back pain and myalgias.  Skin: Negative for color change and rash.  Neurological: Negative for seizures, syncope and headaches.  All other systems reviewed and are negative.    Physical Exam Triage Vital Signs ED Triage Vitals  Enc Vitals Group     BP 07/22/17 1922 110/76     Pulse Rate 07/22/17 1922 (!) 104     Resp 07/22/17 1922 (!) 24     Temp 07/22/17 1922 98.6 F (37 C)     Temp Source 07/22/17 1922 Oral     SpO2 07/22/17 1922 96 %     Weight --      Height --      Head Circumference --      Peak Flow --      Pain Score 07/22/17 1920 7     Pain Loc --      Pain Edu? --      Excl. in GC? --    No data found.  Updated Vital Signs BP 110/76 (BP Location: Right Arm)   Pulse (!) 104   Temp 98.6 F (37 C) (Oral)   Resp (!) 24 Comment: coughing  SpO2 96%   Visual Acuity Right Eye Distance:   Left Eye Distance:   Bilateral Distance:    Right Eye Near:   Left Eye Near:    Bilateral Near:     Physical Exam  Constitutional: He appears well-developed and well-nourished.  HENT:  Head: Normocephalic and atraumatic.  Right Ear: Hearing, tympanic membrane, external ear and ear canal normal.  Left Ear: Hearing, tympanic membrane, external ear and ear canal normal.  Nose: Nose normal.  Mouth/Throat: Uvula is midline and oropharynx is clear and moist. No oropharyngeal exudate, posterior oropharyngeal edema, posterior oropharyngeal erythema or tonsillar abscesses.  Eyes: Conjunctivae are normal.  Neck: Neck supple.   Cardiovascular: Normal rate and regular rhythm.  No murmur heard. Pulmonary/Chest: Effort normal and breath sounds normal. No stridor. No respiratory distress. He has no decreased breath sounds. He has no wheezes. He has no rhonchi. He has no rales. He exhibits tenderness.  Reproducible tenderness to palpation of the chest wall  Abdominal: Soft. There is no tenderness.  Musculoskeletal: He exhibits no edema.  Neurological: He is alert.  Skin: Skin is warm and dry.  Psychiatric: He has a normal mood and affect.  Nursing note and vitals reviewed.    UC Treatments / Results  Labs (all labs ordered are listed, but only abnormal results are displayed) Labs Reviewed - No data to display  EKG  EKG Interpretation None  Radiology No results found.  Procedures Procedures (including critical care time)  Medications Ordered in UC Medications - No data to display   Initial Impression / Assessment and Plan / UC Course  I have reviewed the triage vital signs and the nursing notes.  Pertinent labs & imaging results that were available during my care of the patient were reviewed by me and considered in my medical decision making (see chart for details).     Likely bronchitis.  Will treat with antibiotic, prednisone.  Patient also component of mild nausea.  Given Zofran.  Also given Hycodan cough syrup due to chest wall pain.  Final Clinical Impressions(s) / UC Diagnoses   Final diagnoses:  Acute bronchitis, unspecified organism    ED Discharge Orders        Ordered    HYDROcodone-homatropine (HYCODAN) 5-1.5 MG/5ML syrup  Every 6 hours PRN     07/22/17 1930    doxycycline (VIBRAMYCIN) 100 MG capsule  2 times daily     07/22/17 1930    predniSONE (DELTASONE) 10 MG tablet  Daily     07/22/17 1930    ondansetron (ZOFRAN) 4 MG tablet  Every 8 hours PRN     07/22/17 1930       Controlled Substance Prescriptions [place] Controlled Substance Registry consulted? Yes, I have  consulted the [place] Controlled Substances Registry for this patient, and feel the risk/benefit ratio today is favorable for proceeding with this prescription for a controlled substance.   Alecia Lemming, [place] 07/22/17 [phone]

## 2017-07-26 ENCOUNTER — Encounter (HOSPITAL_COMMUNITY): Payer: Self-pay | Admitting: Emergency Medicine

## 2017-07-26 ENCOUNTER — Ambulatory Visit (INDEPENDENT_AMBULATORY_CARE_PROVIDER_SITE_OTHER): Payer: Medicaid Other

## 2017-07-26 ENCOUNTER — Ambulatory Visit (HOSPITAL_COMMUNITY)
Admission: EM | Admit: 2017-07-26 | Discharge: 2017-07-26 | Disposition: A | Discharge status: Home / Self Care | Payer: Medicaid Other | Attending: Family Medicine | Admitting: Family Medicine

## 2017-07-26 DIAGNOSIS — J40 Bronchitis, not specified as acute or chronic: Secondary | ICD-10-CM

## 2017-07-26 DIAGNOSIS — R05 Cough: Secondary | ICD-10-CM

## 2017-07-26 MED ORDER — BENZONATATE 100 MG PO CAPS: 100.0000 mg | ORAL_CAPSULE | Freq: Three times a day (TID) | ORAL | 0 refills | Status: DC

## 2017-07-26 MED ORDER — ALBUTEROL SULFATE HFA 108 (90 BASE) MCG/ACT IN AERS: 1.0000 | INHALATION_SPRAY | Freq: Four times a day (QID) | RESPIRATORY_TRACT | 0 refills | Status: DC | PRN

## 2017-07-26 NOTE — ED Provider Notes (Signed)
[facility]    CSN: [id number] Arrival date & time: 07/26/17  1607     History   Chief Complaint Chief Complaint  Patient presents with  . URI    HPI [name] is a 42 y.o. male.   [name] presents with complaints of worsening cough. states has had symptoms for approximately 2 weeks. Was seen here 1/23 and started on doxycycline, prednisone and cough syrup. Symptoms have not improved. States he feels he is worse because he has not had heat available in the home he lives. No known fevers. Short of breath due to cough, as well as sore throat. Runny nose. Without ear pain. Non known ill contacts. Vomited yesterday. Mild nausea today. Cough is productive. Without history of asthma. States no longer smokes. Still taking doxycyline, last dose of prednisone today.   ROS per HPI.       Past Medical History:  Diagnosis Date  . Abdominal pain, recurrent    chronic lower abdominal pain  . ADHD (attention deficit hyperactivity disorder)   . Anxiety   . BPH (benign prostatic hyperplasia)   . Chronic pain   . Depression   . Hypertension   . Narcotic abuse (HCC)    opiates    Patient Active Problem List   Diagnosis Date Noted  . Cocaine-induced mood disorder (HCC) 11/29/2015  . Cocaine use disorder, moderate, dependence (HCC) 11/05/2015  . Cannabis use disorder, moderate, dependence (HCC) 11/05/2015  . Attention deficit hyperactivity disorder (ADHD) 11/05/2015  . Opioid use disorder, moderate, in early remission, on maintenance therapy (HCC) 11/05/2015  . MDD (major depressive disorder), recurrent, severe, with psychosis (HCC) 11/05/2015    Past Surgical History:  Procedure Laterality Date  . APPENDECTOMY  08/2005       Home Medications    Prior to Admission medications   Medication Sig Start Date End Date Taking? Authorizing Provider  Divalproex Sodium (DEPAKOTE PO) Take by mouth.   Yes [provider]  doxycycline (VIBRAMYCIN) 100 MG  capsule Take 1 capsule (100 mg total) by mouth 2 (two) times daily. 07/22/17  Yes Blue, Olivia C, PA-C  LamoTRIgine (LAMICTAL PO) Take by mouth.   Yes [provider]  ondansetron (ZOFRAN) 4 MG tablet Take 1 tablet (4 mg total) by mouth every 8 (eight) hours as needed for nausea or vomiting. 07/22/17  Yes Blue, Olivia C, PA-C  benzonatate (TESSALON) 100 MG capsule Take 1 capsule (100 mg total) by mouth every 8 (eight) hours. 07/26/17   Georgetta Haber, NP    Family History Family History  Problem Relation Age of Onset  . Hypertension Father   . Prostate cancer Other   . Mental illness Maternal Aunt   . Drug abuse Maternal Aunt     Social History Social History   Tobacco Use  . Smoking status: Current Every Day Smoker    Packs/day: 0.00    Types: Cigarettes  . Smokeless tobacco: Never Used  Substance Use Topics  . Alcohol use: Yes  . Drug use: Yes    Types: Oxycodone, Opium, Cocaine    Comment: Heroin, "pain pills"     Allergies   Escitalopram oxalate and Contrast media [iodinated diagnostic agents]   Review of Systems Review of Systems   Physical Exam Triage Vital Signs ED Triage Vitals  Enc Vitals Group     BP 07/26/17 1655 (!) 106/52     Pulse Rate 07/26/17 1655 88     Resp 07/26/17 1655 16  Temp 07/26/17 1655 98.5 F (36.9 C)     Temp Source 07/26/17 1655 Oral     SpO2 07/26/17 1655 99 %     Weight 07/26/17 1654 180 lb (81.6 kg)     Height --      Head Circumference --      Peak Flow --      Pain Score 07/26/17 1654 8     Pain Loc --      Pain Edu? --      Excl. in GC? --    No data found.  Updated Vital Signs BP (!) 106/52   Pulse 88   Temp 98.5 F (36.9 C) (Oral)   Resp 16   Wt 180 lb (81.6 kg)   SpO2 99%   BMI 27.37 kg/m   Visual Acuity Right Eye Distance:   Left Eye Distance:   Bilateral Distance:    Right Eye Near:   Left Eye Near:    Bilateral Near:     Physical Exam  Constitutional: He is oriented to person, place,  and time. He appears well-developed and well-nourished.  HENT:  Head: Normocephalic and atraumatic.  Right Ear: Tympanic membrane, external ear and ear canal normal.  Left Ear: Tympanic membrane, external ear and ear canal normal.  Nose: Nose normal. Right sinus exhibits no maxillary sinus tenderness and no frontal sinus tenderness. Left sinus exhibits no maxillary sinus tenderness and no frontal sinus tenderness.  Mouth/Throat: Uvula is midline, oropharynx is clear and moist and mucous membranes are normal.  Eyes: Conjunctivae are normal. Pupils are equal, round, and reactive to light.  Neck: Normal range of motion.  Cardiovascular: Normal rate and regular rhythm.  Pulmonary/Chest: Effort normal and breath sounds normal. He has no wheezes.  Frequent strong dry cough noted; improves while speaking   Lymphadenopathy:    He has no cervical adenopathy.  Neurological: He is alert and oriented to person, place, and time.  Skin: Skin is warm and dry.  Vitals reviewed.    UC Treatments / Results  Labs (all labs ordered are listed, but only abnormal results are displayed) Labs Reviewed - No data to display  EKG  EKG Interpretation None       Radiology Dg Chest 2 View  Result Date: 07/26/2017 CLINICAL DATA:  History of bronchitis. EXAM: CHEST  2 VIEW COMPARISON:  Chest radiograph 08/17/2015 FINDINGS: Stable cardiac and mediastinal contours. No consolidative pulmonary opacities. No pleural effusion or pneumothorax. Thoracic spine degenerative changes. IMPRESSION: No acute cardiopulmonary process. Electronically Signed   By: Annia Belt M.D.   On: 07/26/2017 17:52    Procedures Procedures (including critical care time)  Medications Ordered in UC Medications - No data to display   Initial Impression / Assessment and Plan / UC Course  I have reviewed the triage vital signs and the nursing notes.  Pertinent labs & imaging results that were available during my care of the patient  were reviewed by me and considered in my medical decision making (see chart for details).     Complete course of doxycycline as prescribed. Afebrile. Without tachypnea, tachycardia or hypoxia. Non toxic in appearance. Chest xray without acute findings. Tessalon as needed. Albuterol as needed. Return precautions provided. If symptoms worsen or do not improve in the next week to return to be seen or to follow up with PCP.  Patient verbalized understanding and agreeable to plan.    Final Clinical Impressions(s) / UC Diagnoses   Final diagnoses:  Bronchitis  ED Discharge Orders        Ordered    benzonatate (TESSALON) 100 MG capsule  Every 8 hours     07/26/17 1738       Controlled Substance Prescriptions [place] Controlled Substance Registry consulted? Not Applicable   Georgetta HaberBurky, Natalie B, NP 07/26/17 1811

## 2017-07-26 NOTE — Discharge Instructions (Signed)
Push fluids to ensure adequate hydration and keep secretions thin. Tylenol and/or ibuprofen as needed for pain or fevers.  Complete course of antibiotics as previously prescribed. Use of inhaler as needed for wheezing or shortness of breath. Tessalon as needed for cough.  If symptoms worsen or do not improve in the next week to return to be seen or to follow up with your PCP.

## 2017-07-26 NOTE — ED Triage Notes (Signed)
PT was seen here Wednesday and diagnosed with bronchitis. Since then, he hasn't been able to heat his home and cough has worsened. PT reports SOB and aching all over along with worsened cough.

## 2017-09-21 ENCOUNTER — Ambulatory Visit (HOSPITAL_COMMUNITY)
Admission: EM | Admit: 2017-09-21 | Discharge: 2017-09-21 | Disposition: A | Discharge status: Home / Self Care | Payer: Medicaid Other | Attending: Family Medicine | Admitting: Family Medicine

## 2017-09-21 ENCOUNTER — Encounter (HOSPITAL_COMMUNITY): Payer: Self-pay | Admitting: Emergency Medicine

## 2017-09-21 ENCOUNTER — Ambulatory Visit (INDEPENDENT_AMBULATORY_CARE_PROVIDER_SITE_OTHER): Payer: Medicaid Other

## 2017-09-21 DIAGNOSIS — R05 Cough: Secondary | ICD-10-CM

## 2017-09-21 DIAGNOSIS — R059 Cough, unspecified: Secondary | ICD-10-CM

## 2017-09-21 MED ORDER — ALBUTEROL SULFATE HFA 108 (90 BASE) MCG/ACT IN AERS: 1.0000 | INHALATION_SPRAY | Freq: Four times a day (QID) | RESPIRATORY_TRACT | 0 refills | Status: DC | PRN

## 2017-09-21 MED ORDER — FLUTICASONE PROPIONATE HFA 110 MCG/ACT IN AERO: 2.0000 | INHALATION_SPRAY | Freq: Two times a day (BID) | RESPIRATORY_TRACT | 2 refills | Status: DC

## 2017-09-21 NOTE — Discharge Instructions (Signed)
Stop smoking!  Claritin (loratadine), Allegra (fexofenadine), Zyrtec (cetirizine); these are listed in order from weakest to strongest. Generic, and therefore cheaper, options are in the parentheses.   Flonase (fluticasone); nasal spray that is over the counter. 2 sprays each nostril, once daily. Aim towards the same side eye when you spray.  There are available OTC, and the generic versions, which may be cheaper, are in parentheses. Show this to a pharmacist if you have trouble finding any of these items.  Find a PCP and establish with.

## 2017-09-21 NOTE — ED Provider Notes (Signed)
  [facility]    CSN: [id number] Arrival date & time: 09/21/17  1742  Chief Complaint  Patient presents with  . Cough    [name] is 42 y.o. and is here for a cough.  Duration: 4-5 mo Productive? No Associated symptoms: dyspnea, wheezing Denies: fever, nasal congestion, sore throat, facial pain, chest pain, hemoptysis Hx of GERD? No ACEi? No  Albuterol and Hycodan have been helpful. Tessalon perles and abx have not.  He does smoke.   ROS:  Resp: +Cough Cardio: No chest pain  Past Medical History:  Diagnosis Date  . Abdominal pain, recurrent    chronic lower abdominal pain  . ADHD (attention deficit hyperactivity disorder)   . Anxiety   . BPH (benign prostatic hyperplasia)   . Chronic pain   . Depression   . Hypertension   . Narcotic abuse (HCC)    opiates   Family History  Problem Relation Age of Onset  . Hypertension Father   . Prostate cancer Other   . Mental illness Maternal Aunt   . Drug abuse Maternal Aunt    Allergies as of 09/21/2017      Reactions   Escitalopram Oxalate Hives, Swelling   Contrast Media [iodinated Diagnostic Agents] Nausea And Vomiting       Past Surgical History:  Procedure Laterality Date  . APPENDECTOMY  08/2005     BP (!) 147/82 (BP Location: Left Arm) Comment: Notified Jessica  Pulse (!) 103   Temp 98.1 F (36.7 C) (Oral)   Resp 18   SpO2 99%  Gen: Awake, alert, appears stated age HEENT: Ears neg, nares patent without D/C, turbinates unremarkable, Pharynx pink without exudate Neck: Supple, no masses or asymmetry, no tenderness Heart: RRR, no murmurs, no LE edema Lungs: CTAB, normal effort, no accessory muscle use Psych: Age appropriate judgement and insight, normal mood and affect  Cough  Stop smoking. Flovent 110 mcg, 2 puff bid, rinse mouth out after use. Cont albuterol prn. F/u w a pcp prn. The patient voiced understanding and agreement to the plan.     Sharlene DoryWendling, [name],  [place] 09/21/17 1930

## 2017-09-21 NOTE — ED Triage Notes (Signed)
Pt here for cough x 2 months

## 2017-10-03 ENCOUNTER — Other Ambulatory Visit: Payer: Self-pay

## 2017-10-03 ENCOUNTER — Emergency Department (HOSPITAL_COMMUNITY)
Admission: EM | Admit: 2017-10-03 | Discharge: 2017-10-03 | Disposition: A | Discharge status: Home / Self Care | Payer: Medicaid Other | Attending: Emergency Medicine | Admitting: Emergency Medicine

## 2017-10-03 ENCOUNTER — Emergency Department (HOSPITAL_COMMUNITY): Payer: Medicaid Other

## 2017-10-03 ENCOUNTER — Encounter (HOSPITAL_COMMUNITY): Payer: Self-pay | Admitting: Emergency Medicine

## 2017-10-03 DIAGNOSIS — I10 Essential (primary) hypertension: Secondary | ICD-10-CM | POA: Diagnosis not present

## 2017-10-03 DIAGNOSIS — R05 Cough: Secondary | ICD-10-CM | POA: Diagnosis present

## 2017-10-03 DIAGNOSIS — R059 Cough, unspecified: Secondary | ICD-10-CM

## 2017-10-03 DIAGNOSIS — F909 Attention-deficit hyperactivity disorder, unspecified type: Secondary | ICD-10-CM | POA: Diagnosis not present

## 2017-10-03 DIAGNOSIS — F17210 Nicotine dependence, cigarettes, uncomplicated: Secondary | ICD-10-CM | POA: Diagnosis not present

## 2017-10-03 MED ORDER — ALBUTEROL SULFATE HFA 108 (90 BASE) MCG/ACT IN AERS
1.0000 | INHALATION_SPRAY | Freq: Four times a day (QID) | RESPIRATORY_TRACT | 0 refills | Status: DC | PRN
Start: 2017-10-03 — End: 2018-03-02

## 2017-10-03 NOTE — ED Triage Notes (Signed)
Pt. Stated, this is my 3rd visit to either a UC or hospital for a cough. Ive taken Tessalon perls and most everything for a cough and I still have a cough. Ive also had a chest xray. Shows nothing. I still have a cough.

## 2017-10-03 NOTE — ED Provider Notes (Signed)
[facility] EMERGENCY DEPARTMENT Provider Note   CSN: [id number] Arrival date & time: 10/03/17  0859     History   Chief Complaint Chief Complaint  Patient presents with  . Cough    HPI [name] is a 42 y.o. male past medical history of ADHD, BPH, chronic pain, depression, narcotic abuse who presents for evaluation of persistent cough that has been lingering for the last 2-3 weeks.  Patient reports that symptoms initially began as nasal congestion, rhinorrhea, cough.  He was seen by urgent care and was given antibiotics, albuterol inhaler, Tessalon Perles.  Patient had improvement in the nasal congestion rhinorrhea but states that the cough lingered.  He was seen again by urgent care on 09/21/17.  Chest x-ray at that time was negative.  He was given Flovent, cough syrup which she states she has been taking.  Patient reports that he still having lingering cough.  He states the cough is intermittently productive of green sputum.  Patient states that he currently is still smoking.  He reports he smokes roughly 5-6 cigarettes a day.  Patient denies any fevers, weight loss, night sweats, difficulty breathing, chest x-ray.  The history is provided by the patient.    Past Medical History:  Diagnosis Date  . Abdominal pain, recurrent    chronic lower abdominal pain  . ADHD (attention deficit hyperactivity disorder)   . Anxiety   . BPH (benign prostatic hyperplasia)   . Chronic pain   . Depression   . Hypertension   . Narcotic abuse (HCC)    opiates    Patient Active Problem List   Diagnosis Date Noted  . Cocaine-induced mood disorder (HCC) 11/29/2015  . Cocaine use disorder, moderate, dependence (HCC) 11/05/2015  . Cannabis use disorder, moderate, dependence (HCC) 11/05/2015  . Attention deficit hyperactivity disorder (ADHD) 11/05/2015  . Opioid use disorder, moderate, in early remission, on maintenance therapy (HCC) 11/05/2015  . MDD (major depressive  disorder), recurrent, severe, with psychosis (HCC) 11/05/2015    Past Surgical History:  Procedure Laterality Date  . APPENDECTOMY  08/2005        Home Medications    Prior to Admission medications   Medication Sig Start Date End Date Taking? Authorizing Provider  albuterol (PROVENTIL HFA;VENTOLIN HFA) 108 (90 Base) MCG/ACT inhaler Inhale 1-2 puffs into the lungs every 6 (six) hours as needed for wheezing or shortness of breath. 10/03/17   Maxwell Caul, PA-C  Divalproex Sodium (DEPAKOTE PO) Take by mouth.    [provider]  fluticasone (FLOVENT HFA) 110 MCG/ACT inhaler Inhale 2 puffs into the lungs 2 (two) times daily. 09/21/17   Sharlene Dory, DO  LamoTRIgine (LAMICTAL PO) Take by mouth.    [provider]    Family History Family History  Problem Relation Age of Onset  . Hypertension Father   . Prostate cancer Other   . Mental illness Maternal Aunt   . Drug abuse Maternal Aunt     Social History Social History   Tobacco Use  . Smoking status: Current Every Day Smoker    Packs/day: 0.00    Types: Cigarettes  . Smokeless tobacco: Never Used  Substance Use Topics  . Alcohol use: Yes  . Drug use: Yes    Types: Oxycodone, Opium, Cocaine    Comment: Heroin, "pain pills"     Allergies   Escitalopram oxalate and Contrast media [iodinated diagnostic agents]   Review of Systems Review of Systems  Constitutional: Negative for  fever and unexpected weight change.  HENT: Negative for congestion and rhinorrhea.   Respiratory: Positive for cough. Negative for shortness of breath.   Cardiovascular: Negative for chest pain.     Physical Exam Updated Vital Signs BP 131/80 (BP Location: Right Arm)   Pulse 98   Temp 98.8 F (37.1 C) (Oral)   Resp 18   Ht 5\' 8"  (1.727 m)   Wt 72.6 kg (160 lb)   SpO2 97%   BMI 24.33 kg/m   Physical Exam  Constitutional: He appears well-developed and well-nourished.  HENT:  Head: Normocephalic and  atraumatic.  Eyes: Conjunctivae and EOM are normal. Right eye exhibits no discharge. Left eye exhibits no discharge. No scleral icterus.  Cardiovascular: Normal rate, regular rhythm and normal pulses.  Pulmonary/Chest: Effort normal and breath sounds normal.  No evidence of respiratory distress. Able to speak in full sentences without difficulty.  Neurological: He is alert.  Skin: Skin is warm and dry.  Psychiatric: He has a normal mood and affect. His speech is normal and behavior is normal.  Nursing note and vitals reviewed.    ED Treatments / Results  Labs (all labs ordered are listed, but only abnormal results are displayed) Labs Reviewed - No data to display  EKG None  Radiology Dg Chest 2 View  Result Date: 10/03/2017 CLINICAL DATA:  Cough for several weeks EXAM: CHEST - 2 VIEW COMPARISON:  09/21/2017 FINDINGS: The heart size and mediastinal contours are within normal limits. Both lungs are clear. The visualized skeletal structures are unremarkable. IMPRESSION: No active cardiopulmonary disease. Electronically Signed   By: Alcide Clever M.D.   On: 10/03/2017 10:49    Procedures Procedures (including critical care time)  Medications Ordered in ED Medications - No data to display   Initial Impression / Assessment and Plan / ED Course  I have reviewed the triage vital signs and the nursing notes.  Pertinent labs & imaging results that were available during my care of the patient were reviewed by me and considered in my medical decision making (see chart for details).     42 year old male who presents for evaluation of persistent cough that is been ongoing seen by urgent care and was given antibiotics, Tessalon Perles, albuterol inhaler.  Reports that he has improvement in nasal congestion runny nose but is still continued to have cough.  Patient does report that he smokes 5-6 cigarettes a day.  Went back to urgent care on 09/21/17 for evaluation of cough.  Was given Flovent  inhaler and cough syrup at the time.  Comes in today because he is still having persistent cough.  No fevers, weight loss, difficulty breathing, nasal congestion. Patient is afebrile, non-toxic appearing, sitting comfortably on examination table. Vital signs reviewed and stable.  Lungs clear to auscultation bilaterally.  No evidence of respiratory distress.  I suspect the symptoms are likely secondary to bronchitis is probably prolonged given patient's history of smoking.  No wheezing that would indicate COPD.Low suspicion for pneumonia given history/physical exam.   I discussed options with patient.  Patient would like to ensure there are no acute changes.  Feel that this is reasonable given duration of symptoms.  Chest x-ray reviewed.  Negative for any acute abnormality.  I discussed results with patient.  Symptoms likely result of bronchitis worsened by smoking.  We will plan to give repeat albuterol inhaler to help with symptoms.  Encouraged him since cessation.  Plan to give outpatient referral to [facility] wellness clinic for patient  follow-up with. Patient had ample opportunity for questions and discussion. All patient's questions were answered with full understanding. Strict return precautions discussed. Patient expresses understanding and agreement to plan.    Final Clinical Impressions(s) / ED Diagnoses   Final diagnoses:  Cough    ED Discharge Orders        Ordered    albuterol (PROVENTIL HFA;VENTOLIN HFA) 108 (90 Base) MCG/ACT inhaler  Every 6 hours PRN     10/03/17 1104       Maxwell CaulLayden, Anderia Lorenzo A, PA-C 10/03/17 1238    Margarita Grizzleay, Danielle, MD 10/03/17 [phone]

## 2017-10-03 NOTE — Discharge Instructions (Signed)
Use albuterol inhaler as directed to help with coughing.  As we discussed, you need to cut back on her smoking.  Follow-up with your primary care doctor.  If you do not have a primary care doctor, I provided a referral [facility].  Fever, breathing, worsening cough, any other worsening or concerning symptoms.

## 2017-10-03 NOTE — ED Notes (Signed)
Patient transported to X-ray 

## 2018-02-28 ENCOUNTER — Other Ambulatory Visit: Payer: Self-pay

## 2018-02-28 ENCOUNTER — Emergency Department (HOSPITAL_COMMUNITY)
Admission: EM | Admit: 2018-02-28 | Discharge: 2018-03-01 | Disposition: A | Discharge status: Other Acute Inpatient Hospital | Payer: Medicaid Other | Attending: Emergency Medicine | Admitting: Emergency Medicine

## 2018-02-28 DIAGNOSIS — Z008 Encounter for other general examination: Secondary | ICD-10-CM | POA: Insufficient documentation

## 2018-02-28 DIAGNOSIS — R45851 Suicidal ideations: Secondary | ICD-10-CM | POA: Insufficient documentation

## 2018-02-28 DIAGNOSIS — F32A Depression, unspecified: Secondary | ICD-10-CM

## 2018-02-28 DIAGNOSIS — I10 Essential (primary) hypertension: Secondary | ICD-10-CM | POA: Insufficient documentation

## 2018-02-28 DIAGNOSIS — F209 Schizophrenia, unspecified: Secondary | ICD-10-CM | POA: Insufficient documentation

## 2018-02-28 DIAGNOSIS — Z79899 Other long term (current) drug therapy: Secondary | ICD-10-CM | POA: Insufficient documentation

## 2018-02-28 DIAGNOSIS — F329 Major depressive disorder, single episode, unspecified: Secondary | ICD-10-CM | POA: Insufficient documentation

## 2018-02-28 DIAGNOSIS — F17210 Nicotine dependence, cigarettes, uncomplicated: Secondary | ICD-10-CM | POA: Insufficient documentation

## 2018-02-28 NOTE — ED Triage Notes (Signed)
Patient states that he is in withdrawal for suboxone or ETOH x3 days, he is hearing voices and has not been taking his medications for schizophrenia.

## 2018-03-01 ENCOUNTER — Inpatient Hospital Stay (HOSPITAL_COMMUNITY)
Admission: AD | Admit: 2018-03-01 | Discharge: 2018-03-02 | DRG: 897 | Disposition: A | Discharge status: Home / Self Care | Payer: Medicaid Other | Source: Intra-hospital | Attending: Psychiatry | Admitting: Psychiatry

## 2018-03-01 ENCOUNTER — Other Ambulatory Visit: Payer: Self-pay

## 2018-03-01 ENCOUNTER — Encounter (HOSPITAL_COMMUNITY): Payer: Self-pay | Admitting: *Deleted

## 2018-03-01 DIAGNOSIS — Z813 Family history of other psychoactive substance abuse and dependence: Secondary | ICD-10-CM | POA: Diagnosis not present

## 2018-03-01 DIAGNOSIS — Z7951 Long term (current) use of inhaled steroids: Secondary | ICD-10-CM | POA: Diagnosis not present

## 2018-03-01 DIAGNOSIS — F329 Major depressive disorder, single episode, unspecified: Secondary | ICD-10-CM | POA: Diagnosis present

## 2018-03-01 DIAGNOSIS — F209 Schizophrenia, unspecified: Secondary | ICD-10-CM | POA: Diagnosis present

## 2018-03-01 DIAGNOSIS — Z23 Encounter for immunization: Secondary | ICD-10-CM | POA: Diagnosis not present

## 2018-03-01 DIAGNOSIS — F3289 Other specified depressive episodes: Secondary | ICD-10-CM | POA: Diagnosis present

## 2018-03-01 DIAGNOSIS — Z79899 Other long term (current) drug therapy: Secondary | ICD-10-CM | POA: Diagnosis not present

## 2018-03-01 DIAGNOSIS — R1030 Lower abdominal pain, unspecified: Secondary | ICD-10-CM | POA: Diagnosis present

## 2018-03-01 DIAGNOSIS — M2550 Pain in unspecified joint: Secondary | ICD-10-CM | POA: Diagnosis present

## 2018-03-01 DIAGNOSIS — Z5329 Procedure and treatment not carried out because of patient's decision for other reasons: Secondary | ICD-10-CM | POA: Diagnosis not present

## 2018-03-01 DIAGNOSIS — J45909 Unspecified asthma, uncomplicated: Secondary | ICD-10-CM | POA: Diagnosis present

## 2018-03-01 DIAGNOSIS — F19239 Other psychoactive substance dependence with withdrawal, unspecified: Secondary | ICD-10-CM | POA: Diagnosis present

## 2018-03-01 DIAGNOSIS — R45851 Suicidal ideations: Secondary | ICD-10-CM | POA: Diagnosis present

## 2018-03-01 DIAGNOSIS — F17210 Nicotine dependence, cigarettes, uncomplicated: Secondary | ICD-10-CM | POA: Diagnosis present

## 2018-03-01 DIAGNOSIS — M791 Myalgia, unspecified site: Secondary | ICD-10-CM | POA: Diagnosis present

## 2018-03-01 DIAGNOSIS — F909 Attention-deficit hyperactivity disorder, unspecified type: Secondary | ICD-10-CM | POA: Diagnosis present

## 2018-03-01 DIAGNOSIS — G4700 Insomnia, unspecified: Secondary | ICD-10-CM | POA: Diagnosis present

## 2018-03-01 DIAGNOSIS — Z888 Allergy status to other drugs, medicaments and biological substances status: Secondary | ICD-10-CM

## 2018-03-01 DIAGNOSIS — I10 Essential (primary) hypertension: Secondary | ICD-10-CM | POA: Diagnosis present

## 2018-03-01 DIAGNOSIS — Z811 Family history of alcohol abuse and dependence: Secondary | ICD-10-CM | POA: Diagnosis not present

## 2018-03-01 DIAGNOSIS — F419 Anxiety disorder, unspecified: Secondary | ICD-10-CM | POA: Diagnosis present

## 2018-03-01 DIAGNOSIS — R110 Nausea: Secondary | ICD-10-CM | POA: Diagnosis present

## 2018-03-01 DIAGNOSIS — F1924 Other psychoactive substance dependence with psychoactive substance-induced mood disorder: Secondary | ICD-10-CM | POA: Diagnosis present

## 2018-03-01 DIAGNOSIS — F1123 Opioid dependence with withdrawal: Secondary | ICD-10-CM | POA: Diagnosis present

## 2018-03-01 DIAGNOSIS — Z91041 Radiographic dye allergy status: Secondary | ICD-10-CM

## 2018-03-01 DIAGNOSIS — G8929 Other chronic pain: Secondary | ICD-10-CM | POA: Diagnosis present

## 2018-03-01 DIAGNOSIS — Z818 Family history of other mental and behavioral disorders: Secondary | ICD-10-CM | POA: Diagnosis not present

## 2018-03-01 LAB — COMPREHENSIVE METABOLIC PANEL
ALK PHOS: 44 U/L (ref 38–126)
ALT: 15 U/L (ref 0–44)
AST: 17 U/L (ref 15–41)
Albumin: 3.8 g/dL (ref 3.5–5.0)
Anion gap: 9 (ref 5–15)
BILIRUBIN TOTAL: 0.7 mg/dL (ref 0.3–1.2)
BUN: 14 mg/dL (ref 6–20)
CALCIUM: 9 mg/dL (ref 8.9–10.3)
CO2: 27 mmol/L (ref 22–32)
CREATININE: 0.92 mg/dL (ref 0.61–1.24)
Chloride: 104 mmol/L (ref 98–111)
GFR calc non Af Amer: >60 mL/min (ref >60)
GLUCOSE: 88 mg/dL (ref 70–99)
Potassium: 4 mmol/L (ref 3.5–5.1)
Sodium: 140 mmol/L (ref 135–145)
TOTAL PROTEIN: 6.4 g/dL — AB (ref 6.5–8.1)

## 2018-03-01 LAB — ETHANOL: Alcohol, Ethyl (B): <10 mg/dL (ref <10)

## 2018-03-01 LAB — CBC
HCT: 41.3 % (ref 39.0–52.0)
HEMOGLOBIN: 13.3 g/dL (ref 13.0–17.0)
MCH: 31.7 pg (ref 26.0–34.0)
MCHC: 32.2 g/dL (ref 30.0–36.0)
MCV: 98.6 fL (ref 78.0–100.0)
Platelets: 223 10*3/uL (ref 150–400)
RBC: 4.19 MIL/uL — AB (ref 4.22–5.81)
RDW: 12.6 % (ref 11.5–15.5)
WBC: 7.1 10*3/uL (ref 4.0–10.5)

## 2018-03-01 LAB — SALICYLATE LEVEL: Salicylate Lvl: <7 mg/dL (ref 2.8–30.0)

## 2018-03-01 LAB — ACETAMINOPHEN LEVEL

## 2018-03-01 LAB — RAPID URINE DRUG SCREEN, HOSP PERFORMED
AMPHETAMINES: POSITIVE — AB
BARBITURATES: NOT DETECTED
BENZODIAZEPINES: NOT DETECTED
COCAINE: POSITIVE — AB
Opiates: POSITIVE — AB
TETRAHYDROCANNABINOL: POSITIVE — AB

## 2018-03-01 MED ORDER — LOPERAMIDE HCL 2 MG PO CAPS: 2.0000 mg | ORAL_CAPSULE | ORAL | Status: DC | PRN

## 2018-03-01 MED ORDER — DICYCLOMINE HCL 20 MG PO TABS: 20.0000 mg | ORAL_TABLET | Freq: Four times a day (QID) | ORAL | Status: DC | PRN

## 2018-03-01 MED ORDER — CLONIDINE HCL 0.2 MG PO TABS: 0.1000 mg | ORAL_TABLET | Freq: Every day | ORAL | Status: DC

## 2018-03-01 MED ORDER — NICOTINE 21 MG/24HR TD PT24
21.0000 mg | MEDICATED_PATCH | Freq: Every day | TRANSDERMAL | Status: DC
  Filled 2018-03-01 (×3): qty 1

## 2018-03-01 MED ORDER — HYDROXYZINE HCL 25 MG PO TABS
25.0000 mg | ORAL_TABLET | Freq: Four times a day (QID) | ORAL | Status: DC | PRN
  Administered 2018-03-01 – 2018-03-02 (×3): 25 mg via ORAL
  Filled 2018-03-01 (×3): qty 1

## 2018-03-01 MED ORDER — ONDANSETRON 4 MG PO TBDP: 4.0000 mg | ORAL_TABLET | Freq: Four times a day (QID) | ORAL | Status: DC | PRN

## 2018-03-01 MED ORDER — ALUM & MAG HYDROXIDE-SIMETH 200-200-20 MG/5ML PO SUSP: 30.0000 mL | ORAL | Status: DC | PRN

## 2018-03-01 MED ORDER — ZOLPIDEM TARTRATE 5 MG PO TABS: 5.0000 mg | ORAL_TABLET | Freq: Every evening | ORAL | Status: DC | PRN

## 2018-03-01 MED ORDER — NAPROXEN 250 MG PO TABS: 500.0000 mg | ORAL_TABLET | Freq: Two times a day (BID) | ORAL | Status: DC | PRN

## 2018-03-01 MED ORDER — ACETAMINOPHEN 325 MG PO TABS: 650.0000 mg | ORAL_TABLET | ORAL | Status: DC | PRN

## 2018-03-01 MED ORDER — CLONIDINE HCL 0.2 MG PO TABS: 0.1000 mg | ORAL_TABLET | Freq: Four times a day (QID) | ORAL | Status: DC

## 2018-03-01 MED ORDER — CLONIDINE HCL 0.1 MG PO TABS: 0.1000 mg | ORAL_TABLET | Freq: Every day | ORAL | Status: DC

## 2018-03-01 MED ORDER — NAPROXEN 500 MG PO TABS
500.0000 mg | ORAL_TABLET | Freq: Two times a day (BID) | ORAL | Status: DC | PRN
  Administered 2018-03-01: 500 mg via ORAL
  Filled 2018-03-01: qty 1

## 2018-03-01 MED ORDER — NICOTINE 21 MG/24HR TD PT24: 21.0000 mg | MEDICATED_PATCH | Freq: Every day | TRANSDERMAL | Status: DC

## 2018-03-01 MED ORDER — PNEUMOCOCCAL VAC POLYVALENT 25 MCG/0.5ML IJ INJ
0.5000 mL | INJECTION | INTRAMUSCULAR | Status: AC
  Administered 2018-03-02: 0.5 mL via INTRAMUSCULAR

## 2018-03-01 MED ORDER — MAGNESIUM HYDROXIDE 400 MG/5ML PO SUSP: 30.0000 mL | Freq: Every day | ORAL | Status: DC | PRN

## 2018-03-01 MED ORDER — ALUM & MAG HYDROXIDE-SIMETH 200-200-20 MG/5ML PO SUSP: 30.0000 mL | Freq: Four times a day (QID) | ORAL | Status: DC | PRN

## 2018-03-01 MED ORDER — METHOCARBAMOL 500 MG PO TABS
500.0000 mg | ORAL_TABLET | Freq: Three times a day (TID) | ORAL | Status: DC | PRN
  Administered 2018-03-01: 500 mg via ORAL
  Filled 2018-03-01: qty 1

## 2018-03-01 MED ORDER — HYDROXYZINE HCL 25 MG PO TABS: 25.0000 mg | ORAL_TABLET | Freq: Four times a day (QID) | ORAL | Status: DC | PRN

## 2018-03-01 MED ORDER — METHOCARBAMOL 500 MG PO TABS: 500.0000 mg | ORAL_TABLET | Freq: Three times a day (TID) | ORAL | Status: DC | PRN

## 2018-03-01 MED ORDER — ACETAMINOPHEN 325 MG PO TABS: 650.0000 mg | ORAL_TABLET | Freq: Four times a day (QID) | ORAL | Status: DC | PRN

## 2018-03-01 MED ORDER — CLONIDINE HCL 0.1 MG PO TABS
0.1000 mg | ORAL_TABLET | Freq: Two times a day (BID) | ORAL | Status: DC
  Filled 2018-03-01 (×3): qty 1

## 2018-03-01 MED ORDER — CLONIDINE HCL 0.2 MG PO TABS: 0.1000 mg | ORAL_TABLET | Freq: Two times a day (BID) | ORAL | Status: DC

## 2018-03-01 MED ORDER — ONDANSETRON HCL 4 MG PO TABS: 4.0000 mg | ORAL_TABLET | Freq: Three times a day (TID) | ORAL | Status: DC | PRN

## 2018-03-01 MED ORDER — CLONIDINE HCL 0.1 MG PO TABS
0.1000 mg | ORAL_TABLET | Freq: Four times a day (QID) | ORAL | Status: DC
  Administered 2018-03-01 – 2018-03-02 (×3): 0.1 mg via ORAL
  Filled 2018-03-01 (×6): qty 1

## 2018-03-01 NOTE — Progress Notes (Signed)
Pt has been in bed since the beginning of the evening shift.  He c/o passive suicidal thoughts, but contracts for safety.  He denies HI/AVH at this time.  Pt reports moderate withdrawal symptoms for which he was medicated at bedtime per protocol.  He voiced no other needs or concerns.  Support and encouragement offered.  Snack offered after the group time.  Discharge plans are in process.  Safety maintained with q15 minute checks.

## 2018-03-01 NOTE — ED Triage Notes (Signed)
[facility] called for transportation.  

## 2018-03-01 NOTE — ED Provider Notes (Addendum)
[facility] EMERGENCY DEPARTMENT Provider Note   CSN: [id number] Arrival date & time: 02/28/18  2116     History   Chief Complaint Chief Complaint  Patient presents with  . Suicidal    HPI [name] is a 42 y.o. male.  The history is provided by the patient. No language interpreter was used.  Mental Health Problem  Presenting symptoms: depression   Degree of incapacity (severity):  Moderate Onset quality:  Gradual Duration:  2 days Timing:  Constant Progression:  Worsening Chronicity:  Recurrent Context: drug abuse   Treatment compliance:  Some of the time Relieved by:  Nothing Worsened by:  Nothing Ineffective treatments:  None tried Associated symptoms: feelings of worthlessness   Associated symptoms: no abdominal pain   Pt reports he is on suboxone for substance abuse.  Pt has been out for the past 2 days.  Pt reports he recently moved here.  No local MD.   Past Medical History:  Diagnosis Date  . Abdominal pain, recurrent    chronic lower abdominal pain  . ADHD (attention deficit hyperactivity disorder)   . Anxiety   . BPH (benign prostatic hyperplasia)   . Chronic pain   . Depression   . Hypertension   . Narcotic abuse (HCC)    opiates    Patient Active Problem List   Diagnosis Date Noted  . Cocaine-induced mood disorder (HCC) 11/29/2015  . Cocaine use disorder, moderate, dependence (HCC) 11/05/2015  . Cannabis use disorder, moderate, dependence (HCC) 11/05/2015  . Attention deficit hyperactivity disorder (ADHD) 11/05/2015  . Opioid use disorder, moderate, in early remission, on maintenance therapy (HCC) 11/05/2015  . MDD (major depressive disorder), recurrent, severe, with psychosis (HCC) 11/05/2015    Past Surgical History:  Procedure Laterality Date  . APPENDECTOMY  08/2005        Home Medications    Prior to Admission medications   Medication Sig Start Date End Date Taking? Authorizing Provider  albuterol  (PROVENTIL HFA;VENTOLIN HFA) 108 (90 Base) MCG/ACT inhaler Inhale 1-2 puffs into the lungs every 6 (six) hours as needed for wheezing or shortness of breath. 10/03/17   Maxwell Caul, PA-C  Divalproex Sodium (DEPAKOTE PO) Take by mouth.    [provider]  fluticasone (FLOVENT HFA) 110 MCG/ACT inhaler Inhale 2 puffs into the lungs 2 (two) times daily. 09/21/17   Sharlene Dory, DO  LamoTRIgine (LAMICTAL PO) Take by mouth.    [provider]    Family History Family History  Problem Relation Age of Onset  . Hypertension Father   . Prostate cancer Other   . Mental illness Maternal Aunt   . Drug abuse Maternal Aunt     Social History Social History   Tobacco Use  . Smoking status: Current Every Day Smoker    Packs/day: 0.00    Types: Cigarettes  . Smokeless tobacco: Never Used  Substance Use Topics  . Alcohol use: Yes  . Drug use: Yes    Types: Oxycodone, Opium, Cocaine    Comment: Heroin, "pain pills"     Allergies   Escitalopram oxalate and Contrast media [iodinated diagnostic agents]   Review of Systems Review of Systems  Gastrointestinal: Negative for abdominal pain.  All other systems reviewed and are negative.    Physical Exam Updated Vital Signs BP 98/67 (BP Location: Right Arm)   Pulse (!) 109   Temp 98.6 F (37 C) (Oral)   Resp 16   Ht 5\' 7"  (1.702  m)   Wt 72 kg   SpO2 98%   BMI 24.86 kg/m   Physical Exam  Constitutional: He appears well-developed and well-nourished.  HENT:  Head: Normocephalic and atraumatic.  Eyes: Conjunctivae are normal.  Neck: Neck supple.  Cardiovascular: Normal rate and regular rhythm.  No murmur heard. Pulmonary/Chest: Effort normal and breath sounds normal. No respiratory distress.  Abdominal: Soft. There is no tenderness.  Musculoskeletal: He exhibits no edema.  Neurological: He is alert.  Skin: Skin is warm and dry.  Psychiatric: He has a normal mood and affect.  Nursing note and vitals  reviewed.    ED Treatments / Results  Labs (all labs ordered are listed, but only abnormal results are displayed) Labs Reviewed  CBC - Abnormal; Notable for the following components:      Result Value   RBC 4.19 (*)    All other components within normal limits  COMPREHENSIVE METABOLIC PANEL  ETHANOL  SALICYLATE LEVEL  ACETAMINOPHEN LEVEL  RAPID URINE DRUG SCREEN, HOSP PERFORMED    EKG None  Radiology No results found.  Procedures Procedures (including critical care time)  Medications Ordered in ED Medications - No data to display   Initial Impression / Assessment and Plan / ED Course  I have reviewed the triage vital signs and the nursing notes.  Pertinent labs & imaging results that were available during my care of the patient were reviewed by me and considered in my medical decision making (see chart for details).     MDM  Labs reviewed,   Pt has not obtained a urine.  Pt is a known substance abuser.  TTS consulted  TTS consulted and recommended inpatient.  CIWA and withdrawal ordered.   Final Clinical Impressions(s) / ED Diagnoses   Final diagnoses:  Suicidal thoughts  Depression, unspecified depression type    ED Discharge Orders    None       Elson Areas, Cordelia Poche 03/01/18 0315    Elson Areas, PA-C 03/01/18 0354    Zadie Rhine, MD 03/01/18 [phone]

## 2018-03-01 NOTE — Progress Notes (Signed)
Per Donell Sievert, PA pt meets criteria for inpt treatment, TTS to seek placement, EDP Keenan Bachelor, Lonia Skinner, PA-C and Victorino Dike, RN have been advised.  Princess Bruins, MSW, LCSW Therapeutic Triage Specialist  [phone]

## 2018-03-01 NOTE — Progress Notes (Signed)
[name] is a 42 year old male pt admitted on voluntary basis. He reports that he has been feeling suicidal and spoke about how he has not had any medications recently and reports relapse on alcohol, cocaine and marijuana. He reports that he recently returned from [place] after a 4 month stay in a rehab program. He spoke about how he would like to get back on his medications while he is here. He does endorse passive SI on admission but able to contract for safety while in the hospital. He reports that he would also like to try another rehab program when he gets discharged. He was oriented to the unit and safety maintained.

## 2018-03-01 NOTE — BH Assessment (Addendum)
Tele Assessment Note   Patient Name: [name] MRN: [id number] Referring Physician: Osie Cheeks Location of Patient: [facility] Location of Provider: Behavioral Health TTS Department  [name] is an 42 y.o. male who presents to the ED voluntarily expressing SI with a plan to "hang myself or take some pills." Pt states he recently moved to [facility] from [place] about 1 week ago. Pt states while in [place] he was on suboxone that he has not received in about 3 days. Pt states he lives with his parents and does not work. Pt states he has not been able to establish a MH provider since moving to [place]. TTS asked the pt what specific stressors he has that is causing him to want to end his life and he stated "I don't know." Pt states the suicidal thoughts began about 2 days ago and they have been getting increasingly worse. Pt states he experiences AH with command to "kill yourself." Pt states the voices say "just crazy stuff." Pt denies HI to this Clinical research associate. Pt states he has not taken any psych meds since relocating to [facility]. Pt states he has never attempted suicide before but he has thought about it in the past. Pt states he moved to [place] so that he could be closer to his family. Pt states he has been sleeping more (up to 12 hours a night) and eating less. Pt is unable to contract for safety at this time and states if he is d/c, he does not know if he will try to hurt himself.    Per Stephen Sievert, PA pt meets criteria for inpt treatment, TTS to seek placement, EDP [name], Stephen Skinner, PA-C and Stephen Dike, RN have been advised.  Diagnosis: Schizophrenia   Past Medical History:  Past Medical History:  Diagnosis Date  . Abdominal pain, recurrent    chronic lower abdominal pain  . ADHD (attention deficit hyperactivity disorder)   . Anxiety   . BPH (benign prostatic hyperplasia)   . Chronic pain   . Depression   . Hypertension   . Narcotic abuse (HCC)    opiates    Past Surgical History:   Procedure Laterality Date  . APPENDECTOMY  08/2005    Family History:  Family History  Problem Relation Age of Onset  . Hypertension Father   . Prostate cancer Other   . Mental illness Maternal Aunt   . Drug abuse Maternal Aunt     Social History:  reports that he has been smoking cigarettes. He has been smoking about 0.00 packs per day. He has never used smokeless tobacco. He reports that he drinks alcohol. He reports that he has current or past drug history. Drugs: Oxycodone, Opium, and Cocaine.  Additional Social History:  Alcohol / Drug Use Pain Medications: See MAR Prescriptions: See MAR Over the Counter: See MAR History of alcohol / drug use?: Yes Longest period of sobriety (when/how long): 1 year Substance #1 Name of Substance 1: Pain pills 1 - Age of First Use: unknown 1 - Amount (size/oz): varied 1 - Frequency: has not used in 1 year 1 - Duration: several years 1 - Last Use / Amount: 2018  CIWA: CIWA-Ar BP: 98/67 Pulse Rate: (!) 109 COWS:    Allergies:  Allergies  Allergen Reactions  . Escitalopram Oxalate Hives and Swelling  . Contrast Media [Iodinated Diagnostic Agents] Nausea And Vomiting    Home Medications:  (Not in a hospital admission)  OB/GYN Status:  No LMP for male  patient.  General Assessment Data Location of Assessment: [facility] ED TTS Assessment: In system Is this a Tele or Face-to-Face Assessment?: Tele Assessment Is this an Initial Assessment or a Re-assessment for this encounter?: Initial Assessment Patient Accompanied by:: (alone) Language Other than English: No What gender do you identify as?: Male Marital status: Single Pregnancy Status: No Living Arrangements: Parent Can pt return to current living arrangement?: Yes Admission Status: Voluntary Is patient capable of signing voluntary admission?: Yes Referral Source: Self/Family/Friend Insurance type: Medicaid     Crisis Care Plan Living Arrangements: Parent Name of  Psychiatrist: none Name of Therapist: none  Education Status Is patient currently in school?: No Is the patient employed, unemployed or receiving disability?: Unemployed  Risk to self with the past 6 months Suicidal Ideation: Yes-Currently Present Has patient been a risk to self within the past 6 months prior to admission? : No Suicidal Intent: No Has patient had any suicidal intent within the past 6 months prior to admission? : No Is patient at risk for suicide?: Yes Suicidal Plan?: Yes-Currently Present Has patient had any suicidal plan within the past 6 months prior to admission? : Yes Specify Current Suicidal Plan: pt states "either hang myself or take some pills" Access to Means: Yes Specify Access to Suicidal Means: pt has access to medication and to ropes or items that could be used to hang himself What has been your use of drugs/alcohol within the last 12 months?: suboxone Previous Attempts/Gestures: No Triggers for Past Attempts: None known Intentional Self Injurious Behavior: None Family Suicide History: No Recent stressful life event(s): Other (Comment)(relocated to [place], withdrawing from suboxone) Persecutory voices/beliefs?: No Depression: Yes Depression Symptoms: Despondent, Loss of interest in usual pleasures, Feeling worthless/self pity, Fatigue Substance abuse history and/or treatment for substance abuse?: Yes Suicide prevention information given to non-admitted patients: Not applicable  Risk to Others within the past 6 months Homicidal Ideation: No Does patient have any lifetime risk of violence toward others beyond the six months prior to admission? : No Thoughts of Harm to Others: No Current Homicidal Intent: No Current Homicidal Plan: No Access to Homicidal Means: No History of harm to others?: No Assessment of Violence: None Noted Does patient have access to weapons?: No Criminal Charges Pending?: No Does patient have a court date: No Is patient on  probation?: No  Psychosis Hallucinations: Auditory Delusions: None noted  Mental Status Report Appearance/Hygiene: Disheveled Eye Contact: Good Motor Activity: Restlessness, Tremors Speech: Logical/coherent Level of Consciousness: Alert, Restless Mood: Anxious, Depressed, Helpless Affect: Anxious, Depressed Anxiety Level: Severe Thought Processes: Relevant, Coherent Judgement: Impaired Orientation: Person, Place, Time, Situation, Appropriate for developmental age Obsessive Compulsive Thoughts/Behaviors: Moderate  Cognitive Functioning Concentration: Normal Memory: Remote Intact, Recent Intact Is patient IDD: No Insight: Poor Impulse Control: Poor Appetite: Fair Have you had any weight changes? : No Change Sleep: Increased Total Hours of Sleep: 12 Vegetative Symptoms: Staying in bed  ADLScreening [facility] Assessment Services) Patient's cognitive ability adequate to safely complete daily activities?: Yes Patient able to express need for assistance with ADLs?: Yes Independently performs ADLs?: Yes (appropriate for developmental age)  Prior Inpatient Therapy Prior Inpatient Therapy: No  Prior Outpatient Therapy Prior Outpatient Therapy: Yes Prior Therapy Dates: 2019 Prior Therapy Facilty/Provider(s): facility in [place] Reason for Treatment: suboxone clinic  Does patient have an ACCT team?: No Does patient have Intensive In-House Services?  : No Does patient have [facility] services? : No Does patient have P4CC services?: No  ADL Screening (condition at time of  admission) Patient's cognitive ability adequate to safely complete daily activities?: Yes Is the patient deaf or have difficulty hearing?: No Does the patient have difficulty seeing, even when wearing glasses/contacts?: No Does the patient have difficulty concentrating, remembering, or making decisions?: No Patient able to express need for assistance with ADLs?: Yes Does the patient have difficulty dressing or  bathing?: No Independently performs ADLs?: Yes (appropriate for developmental age) Does the patient have difficulty walking or climbing stairs?: No Weakness of Legs: None Weakness of Arms/Hands: None  Home Assistive Devices/Equipment Home Assistive Devices/Equipment: None    Abuse/Neglect Assessment (Assessment to be complete while patient is alone) Abuse/Neglect Assessment Can Be Completed: Yes Physical Abuse: Denies Verbal Abuse: Denies Sexual Abuse: Denies Exploitation of patient/patient's resources: Denies Self-Neglect: Denies     Merchant navy officer (For Healthcare) Does Patient Have a Medical Advance Directive?: No Would patient like information on creating a medical advance directive?: No - Patient declined          Disposition: Per Stephen Sievert, PA pt meets criteria for inpt treatment, TTS to seek placement, EDP [name], Stephen Skinner, PA-C and Stephen Dike, RN have been advised. Disposition Initial Assessment Completed for this Encounter: Yes Disposition of Patient: Admit Type of inpatient treatment program: Adult(per Stephen Sievert, PA) Patient refused recommended treatment: No  This service was provided via telemedicine using a 2-way, interactive audio and video technology.  Names of all persons participating in this telemedicine service and their role in this encounter. Name: [facility] Role: Patient  Name: Princess Bruins Role: TTS          Karolee Ohs 03/01/2018 4:17 AM

## 2018-03-01 NOTE — Progress Notes (Signed)
Spoke with pt's nurse Victorino Dike, RN who states the pt is in the hallway and requested 10 minutes to put the pt in a room for the TTS assessment.  Princess Bruins, MSW, LCSW Therapeutic Triage Specialist  [phone]

## 2018-03-01 NOTE — BH Assessment (Signed)
03/01/2018: Per Donell Sievert, PA pt meets criteria for inpt treatment. Patient accepted to [facility]. However, patient's bed is not ready at this time (500-1). AC will contact [facility] staff when the bed becomes available.

## 2018-03-01 NOTE — ED Notes (Signed)
Declined W/C at D/C and was escorted to lobby by RN. 

## 2018-03-01 NOTE — Tx Team (Signed)
Initial Treatment Plan 03/01/2018 2:10 PM Kaamil Reina [facility] BMW:[id number]    PATIENT STRESSORS: Medication change or noncompliance Substance abuse   PATIENT STRENGTHS: Ability for insight Average or above average intelligence General fund of knowledge   PATIENT IDENTIFIED PROBLEMS: Depression Substance Abuse Suicidal thoughts "Get back on my medicine, get back seeing a psychiatrist and find another rehab program"                     DISCHARGE CRITERIA:  Ability to meet basic life and health needs Improved stabilization in mood, thinking, and/or behavior Reduction of life-threatening or endangering symptoms to within safe limits Verbal commitment to aftercare and medication compliance  PRELIMINARY DISCHARGE PLAN: Attend aftercare/continuing care group  PATIENT/FAMILY INVOLVEMENT: This treatment plan has been presented to and reviewed with the patient, Gradon Geraty, and/or family member, .  The patient and family have been given the opportunity to ask questions and make suggestions.  Lanelle Lindo, [place], [place] 03/01/2018, 2:10 PM

## 2018-03-02 DIAGNOSIS — Z813 Family history of other psychoactive substance abuse and dependence: Secondary | ICD-10-CM

## 2018-03-02 DIAGNOSIS — F3289 Other specified depressive episodes: Secondary | ICD-10-CM

## 2018-03-02 DIAGNOSIS — G4700 Insomnia, unspecified: Secondary | ICD-10-CM

## 2018-03-02 DIAGNOSIS — F1924 Other psychoactive substance dependence with psychoactive substance-induced mood disorder: Principal | ICD-10-CM

## 2018-03-02 DIAGNOSIS — Z818 Family history of other mental and behavioral disorders: Secondary | ICD-10-CM

## 2018-03-02 DIAGNOSIS — F19239 Other psychoactive substance dependence with withdrawal, unspecified: Secondary | ICD-10-CM

## 2018-03-02 DIAGNOSIS — F419 Anxiety disorder, unspecified: Secondary | ICD-10-CM

## 2018-03-02 DIAGNOSIS — F17210 Nicotine dependence, cigarettes, uncomplicated: Secondary | ICD-10-CM

## 2018-03-02 MED ORDER — ALBUTEROL SULFATE HFA 108 (90 BASE) MCG/ACT IN AERS: 1.0000 | INHALATION_SPRAY | Freq: Four times a day (QID) | RESPIRATORY_TRACT | 0 refills | Status: DC | PRN

## 2018-03-02 MED ORDER — ALBUTEROL SULFATE HFA 108 (90 BASE) MCG/ACT IN AERS: 1.0000 | INHALATION_SPRAY | Freq: Four times a day (QID) | RESPIRATORY_TRACT | Status: DC | PRN

## 2018-03-02 MED ORDER — FLUTICASONE PROPIONATE HFA 110 MCG/ACT IN AERO
2.0000 | INHALATION_SPRAY | Freq: Two times a day (BID) | RESPIRATORY_TRACT | Status: DC
  Filled 2018-03-02: qty 12

## 2018-03-02 MED ORDER — FLUTICASONE PROPIONATE HFA 110 MCG/ACT IN AERO: 2.0000 | INHALATION_SPRAY | Freq: Two times a day (BID) | RESPIRATORY_TRACT | 2 refills | Status: DC

## 2018-03-02 MED ORDER — GABAPENTIN 100 MG PO CAPS
100.0000 mg | ORAL_CAPSULE | Freq: Three times a day (TID) | ORAL | Status: DC
  Administered 2018-03-02: 100 mg via ORAL
  Filled 2018-03-02 (×7): qty 1

## 2018-03-02 NOTE — Progress Notes (Signed)
Discharge note: Patient reviewed discharge paperwork with RN including prescriptions, follow up appointments, and lab work. Patient given the opportunity to ask questions. All concerns were addressed. All belongings were returned to patient. Denied SI/HI/AVH. Patient thanked staff for their care while at the hospital.  Patient was discharged to the lobby where the patient's mother was waiting to pick him up.

## 2018-03-02 NOTE — Progress Notes (Signed)
Patient slept poor last night, sleep medication was requested and was not helpful. Appetite has been good, energy level low,and concentration poor. Depression, hopelessness, and anxiety are rated 7, 6, 5. Withdrawal symptoms include agitation and chilling. Endorses SI "sometimes." Endorses pain in his back and leg rated 10/10. Patient claims pain medications have not been helpful.  Safety is maintained with 15 minute checks as well as environmental checks. Will continue to monitor.   Patient did not get out of bed for morning medications. Complained to CSW about w/d symptoms, but patient was asleep when writer went into his room. Will continue to monitor.

## 2018-03-02 NOTE — H&P (Addendum)
Psychiatric Admission Assessment Adult  Patient Identification: [name]  MRN:  [id number]  Date of Evaluation:  03/02/2018  Chief Complaint: Suicidal ideations.  Principal Diagnosis: Substance or medication-induced depressive disorder with onset during withdrawal [facility])  Diagnosis:   Patient Active Problem List   Diagnosis Date Noted  . Substance or medication-induced depressive disorder with onset during withdrawal [facility]) [E83.151, F19.24, F32.89] 11/05/2015    Priority: High  . Schizophrenia ([place]) [F20.9] 03/01/2018  . Cocaine-induced mood disorder ([place]) [F14.94] 11/29/2015  . Cocaine use disorder, moderate, dependence ([place]) [F14.20] 11/05/2015  . Cannabis use disorder, moderate, dependence ([place]) [F12.20] 11/05/2015  . Attention deficit hyperactivity disorder (ADHD) [F90.9] 11/05/2015  . Opioid use disorder, moderate, in early remission, on maintenance therapy ([place]) [F11.21] 11/05/2015  . MDD (major depressive disorder), recurrent, severe, with psychosis ([place]) [F33.3] 11/05/2015   History of Present Illness: This is an admission assessment for this 42 year old Caucasian male with hx of polysubstance use disorder & other mental illness. He is known on this unit from previous hospitalization due to substance use disorder. He is being admitted to the [facility] from the [facility] ED with complaint of suicidal ideations & the need for drug detoxification treatments. He is already on clonidine detoxification protocols.  During this assessment, [name] reports, "I cannot remember when I went to the hospital or how long I was there. I know I was feeling suicidal & I needed drug detox. The suicidal thoughts started 7 days ago. I'm not sure why I started feeling that way. I did not try to hurt myself & I have not attempted to hurt myself in the past. I'm currently having withdrawal symptoms from suboxen & I need to get back on it. I took it last 3 days ago & I have been on it x 3 years.  I was getting the prescription from [place] where I had been in the last 4 months in a rehabilitation treatment program. I moved back to [place] a week ago. I relapsed on Cocaine about a week ago. I cannot give a reason for the relapse. I just did. I have hx of bipolar disorder. I was on medication for it, but have not been on those medications for about a week. Are you going to re-start me on my Suboxen & Ritalin or not? If the answer is no, discharge me. I do not want to be here if I'm not going to be on those medicines. I'm not suicidal, so, can I just leave now? There are a lot of other doctor's out there who are willing to prescribe me my Suboxen & Ritalin".  Associated Signs/Symptoms:  Depression Symptoms:  depressed mood, psychomotor agitation, anxiety,  (Hypo) Manic Symptoms:  Impulsivity, Irritable Mood, Labiality of Mood,  Anxiety Symptoms:  Excessive Worry,  Psychotic Symptoms:  Currently denies any hallucinations, delusions or paranoia  PTSD Symptoms: Denies any PTSD symptoms or events.  Total Time spent with patient: 1 hour  Past Psychiatric History: Yes (Bipolar disorder, polysubstance use disorder including opioid drugs)  Is the patient at risk to self? No.  Has the patient been a risk to self in the past 6 months? Yes.    Has the patient been a risk to self within the distant past? No.  Is the patient a risk to others? No.  Has the patient been a risk to others in the past 6 months? No.  Has the patient been a risk to others within the distant past? No.  Prior Inpatient Therapy: Yes Prior Outpatient Therapy: Yes  Alcohol Screening: 1. How often do you have a drink containing alcohol?: 4 or more times a week 2. How many drinks containing alcohol do you have on a typical day when you are drinking?: 10 or more 3. How often do you have six or more drinks on one occasion?: Daily or almost daily AUDIT-C Score: 12 4. How often during the last year have you found  that you were not able to stop drinking once you had started?: Daily or almost daily 5. How often during the last year have you failed to do what was normally expected from you becasue of drinking?: Daily or almost daily 6. How often during the last year have you needed a first drink in the morning to get yourself going after a heavy drinking session?: Daily or almost daily 7. How often during the last year have you had a feeling of guilt of remorse after drinking?: Daily or almost daily 8. How often during the last year have you been unable to remember what happened the night before because you had been drinking?: Daily or almost daily 9. Have you or someone else been injured as a result of your drinking?: Yes, during the last year 10. Has a relative or friend or a doctor or another health worker been concerned about your drinking or suggested you cut down?: Yes, during the last year Alcohol Use Disorder Identification Test Final Score (AUDIT): 40 Intervention/Follow-up: Alcohol Education  Substance Abuse History in the last 12 months:  Yes.    Consequences of Substance Abuse: Medical Consequences:  Liver damage, Possible death by overdose Legal Consequences:  Arrests, jail time, Loss of driving privilege. Family Consequences:  Family discord, divorce and or separation.  Previous Psychotropic Medications: Yes   Psychological Evaluations: No   Past Medical History:  Past Medical History:  Diagnosis Date  . Abdominal pain, recurrent    chronic lower abdominal pain  . ADHD (attention deficit hyperactivity disorder)   . Anxiety   . BPH (benign prostatic hyperplasia)   . Chronic pain   . Depression   . Hypertension   . Narcotic abuse ([place])    opiates    Past Surgical History:  Procedure Laterality Date  . APPENDECTOMY  08/2005   Family History:  Family History  Problem Relation Age of Onset  . Hypertension Father   . Prostate cancer Other   . Mental illness Maternal Aunt   .  Drug abuse Maternal Aunt    Family Psychiatric  History: (Prozac)  Tobacco Screening: Have you used any form of tobacco in the last 30 days? (Cigarettes, Smokeless Tobacco, Cigars, and/or Pipes): Yes Tobacco use, Select all that apply: 5 or more cigarettes per day Are you interested in Tobacco Cessation Medications?: Yes, will notify MD for an order Counseled patient on smoking cessation including recognizing danger situations, developing coping skills and basic information about quitting provided: Refused/Declined practical counseling Social History:  Social History   Substance and Sexual Activity  Alcohol Use Yes     Social History   Substance and Sexual Activity  Drug Use Yes  . Types: Oxycodone, Opium, Cocaine   Comment: Heroin, "pain pills"    Additional Social History: Single, no children, unemployed  Allergies:   Allergies  Allergen Reactions  . Escitalopram Oxalate Hives and Swelling  . Contrast Media [Iodinated Diagnostic Agents] Nausea And Vomiting   Lab Results:  Results for orders placed or performed during the hospital encounter  of 02/28/18 (from the past 48 hour(s))  Comprehensive metabolic panel     Status: Abnormal   Collection Time: 03/01/18  1:28 AM  Result Value Ref Range   Sodium 140 135 - 145 mmol/L   Potassium 4.0 3.5 - 5.1 mmol/L   Chloride 104 98 - 111 mmol/L   CO2 27 22 - 32 mmol/L   Glucose, Bld 88 70 - 99 mg/dL   BUN 14 6 - 20 mg/dL   Creatinine, Ser 0.92 0.61 - 1.24 mg/dL   Calcium 9.0 8.9 - 10.3 mg/dL   Total Protein 6.4 (L) 6.5 - 8.1 g/dL   Albumin 3.8 3.5 - 5.0 g/dL   AST 17 15 - 41 U/L   ALT 15 0 - 44 U/L   Alkaline Phosphatase 44 38 - 126 U/L   Total Bilirubin 0.7 0.3 - 1.2 mg/dL   GFR calc non Af Amer >60 >60 mL/min   GFR calc Af Amer >60 >60 mL/min    Comment: (NOTE) The eGFR has been calculated using the CKD EPI equation. This calculation has not been validated in all clinical situations. eGFR's persistently <60 mL/min signify  possible Chronic Kidney Disease.    Anion gap 9 5 - 15    Comment: Performed at [facility] [address] [place], [place] [address]  Ethanol     Status: None   Collection Time: 03/01/18  1:28 AM  Result Value Ref Range   Alcohol, Ethyl (B) <10 <10 mg/dL    Comment: (NOTE) Lowest detectable limit for serum alcohol is 10 mg/dL. For medical purposes only. Performed at [facility], [place] [address] [place], [place] [address]   Salicylate level     Status: None   Collection Time: 03/01/18  1:28 AM  Result Value Ref Range   Salicylate Lvl <7.5 2.8 - 30.0 mg/dL    Comment: Performed at [facility] [address], [place], [place] [address]  Acetaminophen level     Status: Abnormal   Collection Time: 03/01/18  1:28 AM  Result Value Ref Range   Acetaminophen (Tylenol), Serum <10 (L) 10 - 30 ug/mL    Comment: (NOTE) Therapeutic concentrations vary significantly. A range of 10-30 ug/mL  may be an effective concentration for many patients. However, some  are best treated at concentrations outside of this range. Acetaminophen concentrations >150 ug/mL at 4 hours after ingestion  and >50 ug/mL at 12 hours after ingestion are often associated with  toxic reactions. Performed at [facility], [place] [address] [place], [place] [address]   cbc     Status: Abnormal   Collection Time: 03/01/18  1:28 AM  Result Value Ref Range   WBC 7.1 4.0 - 10.5 K/uL   RBC 4.19 (L) 4.22 - 5.81 MIL/uL   Hemoglobin 13.3 13.0 - 17.0 g/dL   HCT 41.3 39.0 - 52.0 %   MCV 98.6 78.0 - 100.0 fL   MCH 31.7 26.0 - 34.0 pg   MCHC 32.2 30.0 - 36.0 g/dL   RDW 12.6 11.5 - 15.5 %   Platelets 223 150 - 400 K/uL    Comment: Performed at [facility], [place] [address] [place], [place] [address]  Rapid urine drug screen (hospital performed)     Status: Abnormal   Collection Time: 03/01/18  9:17 AM  Result Value Ref Range   Opiates POSITIVE (A) NONE DETECTED   Cocaine POSITIVE (A) NONE  DETECTED   Benzodiazepines NONE  DETECTED NONE DETECTED   Amphetamines POSITIVE (A) NONE DETECTED   Tetrahydrocannabinol POSITIVE (A) NONE DETECTED   Barbiturates NONE DETECTED NONE DETECTED    Comment: (NOTE) DRUG SCREEN FOR MEDICAL PURPOSES ONLY.  IF CONFIRMATION IS NEEDED FOR ANY PURPOSE, NOTIFY LAB WITHIN 5 DAYS. LOWEST DETECTABLE LIMITS FOR URINE DRUG SCREEN Drug Class                     Cutoff (ng/mL) Amphetamine and metabolites    1000 Barbiturate and metabolites    200 Benzodiazepine                 676 Tricyclics and metabolites     300 Opiates and metabolites        300 Cocaine and metabolites        300 THC                            50 Performed at [facility], [place] [address] [place], [place] [address]    Blood Alcohol level:  Lab Results  Component Value Date   [facility] <10 03/01/2018   ETH <5 32/67/1245   Metabolic Disorder Labs:  Lab Results  Component Value Date   HGBA1C 5.3 11/06/2015   MPG 105 11/06/2015   No results found for: PROLACTIN Lab Results  Component Value Date   CHOL 177 11/06/2015   TRIG 320 (H) 11/06/2015   HDL 34 (L) 11/06/2015   CHOLHDL 5.2 11/06/2015   VLDL 64 (H) 11/06/2015   LDLCALC 79 11/06/2015   Current Medications: Current Facility-Administered Medications  Medication Dose Route Frequency Provider Last Rate Last Dose  . acetaminophen (TYLENOL) tablet 650 mg  650 mg Oral Q6H PRN Elmarie Shiley A, NP      . albuterol (PROVENTIL HFA;VENTOLIN HFA) 108 (90 Base) MCG/ACT inhaler 1-2 puff  1-2 puff Inhalation Q6H PRN Lindell Spar I, NP      . alum & mag hydroxide-simeth (MAALOX/MYLANTA) 200-200-20 MG/5ML suspension 30 mL  30 mL Oral Q4H PRN Elmarie Shiley A, NP      . cloNIDine (CATAPRES) tablet 0.1 mg  0.1 mg Oral QID Elmarie Shiley A, NP   0.1 mg at 03/01/18 2133   Followed by  . [START ON 03/03/2018] cloNIDine (CATAPRES) tablet 0.1 mg  0.1 mg Oral BID Niel Hummer, NP       Followed by  . [START ON 03/05/2018] cloNIDine (CATAPRES)  tablet 0.1 mg  0.1 mg Oral Daily Elmarie Shiley A, NP      . dicyclomine (BENTYL) tablet 20 mg  20 mg Oral Q6H PRN Elmarie Shiley A, NP      . fluticasone (FLOVENT HFA) 110 MCG/ACT inhaler 2 puff  2 puff Inhalation BID Nwoko, Agnes I, NP      . gabapentin (NEURONTIN) capsule 100 mg  100 mg Oral TID Lindell Spar I, NP   100 mg at 03/02/18 1046  . hydrOXYzine (ATARAX/VISTARIL) tablet 25 mg  25 mg Oral Q6H PRN Niel Hummer, NP   25 mg at 03/01/18 2132  . loperamide (IMODIUM) capsule 2-4 mg  2-4 mg Oral PRN Elmarie Shiley A, NP      . magnesium hydroxide (MILK OF MAGNESIA) suspension 30 mL  30 mL Oral Daily PRN Elmarie Shiley A, NP      . methocarbamol (ROBAXIN) tablet 500 mg  500 mg Oral Q8H PRN Elmarie Shiley A, NP   500 mg at  03/01/18 2133  . naproxen (NAPROSYN) tablet 500 mg  500 mg Oral BID PRN Elmarie Shiley A, NP   500 mg at 03/01/18 2132  . nicotine (NICODERM CQ - dosed in mg/24 hours) patch 21 mg  21 mg Transdermal Daily Elmarie Shiley A, NP      . ondansetron (ZOFRAN-ODT) disintegrating tablet 4 mg  4 mg Oral Q6H PRN Niel Hummer, NP       PTA Medications: Medications Prior to Admission  Medication Sig Dispense Refill Last Dose  . albuterol (PROVENTIL HFA;VENTOLIN HFA) 108 (90 Base) MCG/ACT inhaler Inhale 1-2 puffs into the lungs every 6 (six) hours as needed for wheezing or shortness of breath. (Patient not taking: Reported on 03/01/2018) 1 Inhaler 0 Not Taking at Unknown time  . Buprenorphine HCl-Naloxone HCl (SUBOXONE) 8-2 MG FILM Place 1 Film under the tongue 3 (three) times daily.   Past Week at Unknown time  . divalproex (DEPAKOTE) 500 MG DR tablet Take 500 mg by mouth 2 (two) times daily.    Past Week at Unknown time  . FLUoxetine (PROZAC) 40 MG capsule Take 40 mg by mouth daily.   Past Week at Unknown time  . fluticasone (FLOVENT HFA) 110 MCG/ACT inhaler Inhale 2 puffs into the lungs 2 (two) times daily. (Patient not taking: Reported on 03/01/2018) 1 Inhaler 2 Not Taking at Unknown time  .  methylphenidate (RITALIN) 20 MG tablet Take 20 mg by mouth 2 (two) times daily.   Past Week at Unknown time   Musculoskeletal: Strength & Muscle Tone: within normal limits Gait & Station: normal Patient leans: N/A  Psychiatric Specialty Exam: Physical Exam  Nursing note and vitals reviewed. Constitutional: He appears well-developed.  HENT:  Head: Normocephalic.  Eyes: Pupils are equal, round, and reactive to light.  Neck: Normal range of motion.  Cardiovascular: Normal rate.  Respiratory: Effort normal.  GI: Soft.  Genitourinary:  Genitourinary Comments: Deferred  Musculoskeletal: Normal range of motion.  Neurological: He is alert.  Skin: Skin is warm.    Review of Systems  Constitutional: Negative for diaphoresis and malaise/fatigue.  HENT: Negative.   Eyes: Negative.   Respiratory: Negative.  Negative for cough and shortness of breath.   Cardiovascular: Negative.  Negative for chest pain and palpitations.  Gastrointestinal: Positive for abdominal pain and nausea.  Genitourinary: Negative.   Musculoskeletal: Positive for joint pain and myalgias.  Skin: Negative.   Neurological: Negative.   Psychiatric/Behavioral: Positive for depression and substance abuse. Negative for hallucinations, memory loss and suicidal ideas. The patient is nervous/anxious and has insomnia.     Blood pressure (!) 86/61, pulse 68, temperature 98 F (36.7 C), temperature source Oral, resp. rate 18, height _0  (1.702 m), weight 67.1 kg.Body mass index is 23.18 kg/m.  General Appearance: Casual and Fairly Groomed  Eye Contact:  Good  Speech:  Clear and Coherent and Normal Rate  Volume:  Normal  Mood:  Depressed, Hopeless and Irritable  Affect:  Flat  Thought Process:  Coherent, Linear and Descriptions of Associations: Intact  Orientation:  Full (Time, Place, and Person)  Thought Content:  Rumniations, denies any hallucinations, delusions or paranoia.  Suicidal Thoughts:  Denies any thoughts,  plans or intent  Homicidal Thoughts:  Denies  Memory:  Immediate;   Good Recent;   Good Remote;   Good  Judgement:  Good  Insight:  Fair  Psychomotor Activity:  Irritated  Concentration:  Concentration: Fair and Attention Span: Fair  Recall:  [place] of  Knowledge:  Good  Language:  Good  Akathisia:  Negative  Handed:  Right  AIMS (if indicated):     Assets:  Communication Skills Desire for Improvement Physical Health  ADL's:  Intact  Cognition:  WNL  Sleep:  Number of Hours: 6.75   Treatment Plan/Recommendations: 1. Admit for crisis management and stabilization, estimated length of stay 3-5 days.   2. Medication management to reduce current symptoms to base line and improve the patient's overall level of functioning: See MAR, Md's SRA & treatment plan.   Observation Level/Precautions:  15 minute checks  Laboratory:  Per ED, UDS (+) for Amphetamine, Opioid, Cocaine & THC  Psychotherapy: Group sessions, AA/NA meetings.    Medications:  See [facility]  Consultations: As needed.   Discharge Concerns: Safety, mood stabilization.  Estimated LOS: 2-4 days  Other: Admit to to the 300-hall.   Physician Treatment Plan for Primary Diagnosis: Substance or medication-induced depressive disorder with onset during withdrawal ([place])  Long Term Goal(s): Improvement in symptoms so as ready for discharge  Short Term Goals: Ability to identify changes in lifestyle to reduce recurrence of condition will improve and Ability to demonstrate self-control will improve  Physician Treatment Plan for Secondary Diagnosis: Principal Problem:   Substance or medication-induced depressive disorder with onset during withdrawal ([place]) Active Problems:   Schizophrenia ([place])  Long Term Goal(s): Improvement in symptoms so as ready for discharge  Short Term Goals: Ability to identify and develop effective coping behaviors will improve, Compliance with prescribed medications will improve and Ability to identify  triggers associated with substance abuse/mental health issues will improve  I certify that inpatient services furnished can reasonably be expected to improve the patient's condition.    Lindell Spar, NP, PMHNP, FNP-BC 9/3/201911:40 AM   I have reviewed the note by NP, and I am in agreement with the assessment and plan.  Jaykwon Morones is a 42 y/o M with history of treatment for depression and polysubstance abuse who was admitted voluntarily from [facility] where he presented with worsening depression, SI with plan to overdose or hang self, and worsening symptoms of withdrawal from opiates in context of recent relapse of multiple illicit drugs. Pt had come to [place] from [place] about 1 week ago, and during that time he had a relapse of multiple illicit substances. His UDS was positive for amphetamines, opiates, cocaine, and THC.  Upon initial interview, pt shares, "I just came here to get my meds readjusted. I was on suboxone until I came here. I came here from [place] about 1 week ago to visit family, and I ran out of suboxone. Then I relapsed on a bunch of stuff. I came here because I wanted to get back on suboxone, but when I found out that you guys don't prescribe it - I figure I'd just be better off getting to somewhere that prescribes it." Pt denies SI/HI/AH/VH. He reports that SI when he came in were feigned, explaining, "I had to say what I had to say to be honest with you - I'm not suicidal." Pt denies depression. He denies symptoms of mania, OCD, and PTSD. He confirms recent relapse of heroin (insufflated), methamphetamine, crack cocaine, and cannabis in the past week. He denies alcohol and tobacco use.  Discussed with patient about treatment options. He declines for all psychotropic medications and referral to outpatient resources. He declines for offer to detox with opiate withdrawal protocol. Pt requests to discharge so that he can plan his return to [facility] where he  will seek  suboxone treatment. He was able to engage in safety planning including plan to return to [facility] or contact emergency services if he feels unable to maintain his own safety or the safety of others. Pt had no further questions, comments, or concerns.  PLAN OF CARE:   -Discharge to outpatient level of care  -Pt declines for all psychotropic medications aside from suboxone  -Pt declines for referral to outpatient treatment, as he reports that he has outpatient services in [place], [place] and he plans to return there as soon as possible

## 2018-03-02 NOTE — Discharge Summary (Addendum)
Physician Discharge Summary Note  Patient:  [name] is an 42 y.o., male  MRN:  [id number]  DOB:  04-20-1976  Patient phone:  [phone] (home)   Patient address:   [address] [place] [address],   Total Time spent with patient: 1 hour  Date of Admission:  03/01/2018  Date of Discharge: 11/10/2015  Reason for Admission: Worsening depression, SI with plan to overdose or hang self, and worsening symptoms of withdrawal from opiates in context of recent relapse of multiple illicit drugs.   Total Time spent with patient: Greater than 30 minutes.  Past Psychiatric History: Hx. Polysubstance use disorder.   Principal Problem: Substance or medication-induced depressive disorder with onset during withdrawal [facility])  Discharge Diagnoses: Patient Active Problem List   Diagnosis Date Noted  . Substance or medication-induced depressive disorder with onset during withdrawal [facility]) [X52.841, F19.24, F32.89] 11/05/2015    Priority: High  . Schizophrenia (HCC) [F20.9] 03/01/2018  . Cocaine-induced mood disorder (HCC) [F14.94] 11/29/2015  . Cocaine use disorder, moderate, dependence (HCC) [F14.20] 11/05/2015  . Cannabis use disorder, moderate, dependence (HCC) [F12.20] 11/05/2015  . Attention deficit hyperactivity disorder (ADHD) [F90.9] 11/05/2015  . Opioid use disorder, moderate, in early remission, on maintenance therapy (HCC) [F11.21] 11/05/2015  . MDD (major depressive disorder), recurrent, severe, with psychosis (HCC) [F33.3] 11/05/2015   Past Medical History:  Past Medical History:  Diagnosis Date  . Abdominal pain, recurrent    chronic lower abdominal pain  . ADHD (attention deficit hyperactivity disorder)   . Anxiety   . BPH (benign prostatic hyperplasia)   . Chronic pain   . Depression   . Hypertension   . Narcotic abuse (HCC)    opiates    Past Surgical History:  Procedure Laterality Date  . APPENDECTOMY  08/2005   Family History:  Family History   Problem Relation Age of Onset  . Hypertension Father   . Prostate cancer Other   . Mental illness Maternal Aunt   . Drug abuse Maternal Aunt   Social History:  Social History   Substance and Sexual Activity  Alcohol Use Yes     Social History   Substance and Sexual Activity  Drug Use Yes  . Types: Oxycodone, Opium, Cocaine   Comment: Heroin, "pain pills"    Social History   Socioeconomic History  . Marital status: Single    Spouse name: Not on file  . Number of children: Not on file  . Years of education: Not on file  . Highest education level: Not on file  Occupational History  . Not on file  Social Needs  . Financial resource strain: Not on file  . Food insecurity:    Worry: Not on file    Inability: Not on file  . Transportation needs:    Medical: Not on file    Non-medical: Not on file  Tobacco Use  . Smoking status: Current Every Day Smoker    Packs/day: 0.00    Types: Cigarettes  . Smokeless tobacco: Never Used  Substance and Sexual Activity  . Alcohol use: Yes  . Drug use: Yes    Types: Oxycodone, Opium, Cocaine    Comment: Heroin, "pain pills"  . Sexual activity: Yes  Lifestyle  . Physical activity:    Days per week: Not on file    Minutes per session: Not on file  . Stress: Not on file  Relationships  . Social connections:    Talks on phone: Not on file  Gets together: Not on file    Attends religious service: Not on file    Active member of club or organization: Not on file    Attends meetings of clubs or organizations: Not on file    Relationship status: Not on file  Other Topics Concern  . Not on file  Social History Narrative  . Not on file   Hospital Course: (Per Md's admission/discharge SRA):[name] is a 51 y/o M with history of treatment for depression and polysubstance abuse who [facility] he presented with worsening depression, SI with plan to overdose or hang self, and worsening symptoms of  withdrawal from opiates in context of recent relapse of multiple illicit drugs. Pt had come to [place] from [place] about 1 week ago, and during that time he had a relapse of multiple illicit substances. His UDS was positive for amphetamines, opiates, cocaine, and THC.  Upon initial interview, pt shares, "I just came here to get my meds readjusted. I was on suboxone until I came here. I came here from [place], [place] about 1 week ago to visit family, and I ran out of suboxone. Then I relapsed on a bunch of stuff. I came here because I wanted to get back on suboxone, but when I found out that you guys don't prescribe it - I figure I'd just be better off getting to somewhere that prescribes it." Pt denies SI/HI/AH/VH. He reports that SI when he came in were feigned, explaining, "I had to say what I had to say to be honest with you - I'm not suicidal." Pt denies depression. He denies symptoms of mania, OCD, and PTSD. He confirms recent relapse of heroin (insufflated), methamphetamine, crack cocaine, and cannabis in the past week. He denies alcohol and tobacco use.  Discussed with patient about treatment options. He declines for all psychotropic medications and referral to outpatient resources. He declines for offer to detox with opiate withdrawal protocol. Pt requests to discharge so that he can plan his return to [facility] where he will seek suboxone treatment. He was able to engage in safety planning including plan to return to [facility] or contact emergency services if he feels unable to maintain his own safety or the safety of others. Pt had no further questions, comments, or concerns.  Physical Findings: AIMS: Facial and Oral Movements Muscles of Facial Expression: None, normal Lips and Perioral Area: None, normal Jaw: None, normal Tongue: None, normal,Extremity Movements Upper (arms, wrists, hands, fingers): None, normal Lower (legs, knees, ankles, toes): None, normal, Trunk Movements Neck,  shoulders, hips: None, normal, Overall Severity Severity of abnormal movements (highest score from questions above): None, normal Incapacitation due to abnormal movements: None, normal Patient's awareness of abnormal movements (rate only patient's report): No Awareness, Dental Status Current problems with teeth and/or dentures?: No Does patient usually wear dentures?: No  CIWA:    COWS:  COWS Total Score: 5  Musculoskeletal: Strength & Muscle Tone: within normal limits Gait & Station: normal Patient leans: N/A  Psychiatric Specialty Exam: See MD SRA Review of Systems  Constitutional: Negative.   HENT: Negative.   Eyes: Negative.   Respiratory: Negative.   Cardiovascular: Negative.   Gastrointestinal: Negative.   Genitourinary: Negative.   Skin: Negative.   Neurological: Negative.   Endo/Heme/Allergies: Negative.   Psychiatric/Behavioral: Positive for hallucinations (Hx. psychosis.) and substance abuse (Hx. polysubstance use disorder). Negative for depression, memory loss and suicidal ideas. The patient is not nervous/anxious and does not have insomnia.  Blood pressure 104/74, pulse 71, temperature 98 F (36.7 C), temperature source Oral, resp. rate 18, height 5\' 7"  (1.702 m), weight 67.1 kg.Body mass index is 23.18 kg/m.   Have you used any form of tobacco in the last 30 days? (Cigarettes, Smokeless Tobacco, Cigars, and/or Pipes): Yes  Has this patient used any form of tobacco in the last 30 days? (Cigarettes, Smokeless Tobacco, Cigars, and/or Pipes)  Yes, A prescription for an FDA-approved tobacco cessation medication was offered at discharge and the patient refused  Blood Alcohol level:  Lab Results  Component Value Date   [facility] <10 03/01/2018   ETH <5 03/02/2016   Metabolic Disorder Labs:  Lab Results  Component Value Date   HGBA1C 5.3 11/06/2015   MPG 105 11/06/2015   No results found for: PROLACTIN Lab Results  Component Value Date   CHOL 177 11/06/2015   TRIG  320 (H) 11/06/2015   HDL 34 (L) 11/06/2015   CHOLHDL 5.2 11/06/2015   VLDL 64 (H) 11/06/2015   LDLCALC 79 11/06/2015   See Psychiatric Specialty Exam and Suicide Risk Assessment completed by Attending Physician prior to discharge.  Discharge destination:  Home  Is patient on multiple antipsychotic therapies at discharge:  No   Has Patient had three or more failed trials of antipsychotic monotherapy by history:  No  Recommended Plan for Multiple Antipsychotic Therapies: NA  Allergies as of 03/02/2018      Reactions   Escitalopram Oxalate Hives, Swelling   Contrast Media [iodinated Diagnostic Agents] Nausea And Vomiting      Medication List    STOP taking these medications   DEPAKOTE 500 MG DR tablet Generic drug:  divalproex   FLUoxetine 40 MG capsule Commonly known as:  PROZAC   RITALIN 20 MG tablet Generic drug:  methylphenidate   SUBOXONE 8-2 MG Film Generic drug:  Buprenorphine HCl-Naloxone HCl     TAKE these medications     Indication  albuterol 108 (90 Base) MCG/ACT inhaler Commonly known as:  PROVENTIL HFA;VENTOLIN HFA Inhale 1-2 puffs into the lungs every 6 (six) hours as needed for wheezing or shortness of breath.  Indication:  Asthma   fluticasone 110 MCG/ACT inhaler Commonly known as:  FLOVENT HFA Inhale 2 puffs into the lungs 2 (two) times daily. For allergies What changed:  additional instructions  Indication:  Asthma      Follow-up Information    [facility] up.   Specialty:  [facility] information: [address] [place] [address] [phone]          Follow-up recommendations: Activity:  As tolerated Diet: As recommended by your primary care doctor. Keep all scheduled follow-up appointments as recommended.   Comments: Patient refused to remain admitted to the hospital unless he is put back on Suboxen & Ritalin.  Signed: Armandina Stammer, NP, PMHNP, FNP-BC. 03/02/2018, 2:48 PM   Patient seen, Suicide Assessment  Completed.  Disposition Plan Reviewed

## 2018-03-02 NOTE — BHH Counselor (Signed)
Adult Comprehensive Assessment  Patient ID: [name], male   DOB: 08/21/1975, 42 y.o.   MRN: [id number]  Information Source: Information source: Patient  Current Stressors:  Patient states their primary concerns and needs for treatment are:: suboxone use; depression; suicidal thoughts Patient states their goals for this hospitilization and ongoing recovery are:: to get on suboxone and get into treatment Educational / Learning stressors: high school Employment / Job issues: unemployed "for awhile now.' Family Relationships: living with various family and unable to return; mom is primary Scientist, product/process development / Lack of resources (include bankruptcy): no income; [employer] / Lack of housing: homeless. not able to return home to family Physical health (include injuries & life threatening diseases): reports severe withdrawal symptoms Social relationships: poor Substance abuse: reports he has been withdrawing from suboxone for past three days and denies any other recent drug use; UDS positive for amphetamines, cocaine, opiates and THC. Bereavement / Loss: none identified   Living/Environment/Situation:  Living Arrangements: Parent Living conditions (as described by patient or guardian): pt was living in home with a parent but is unable to return Who else lives in the home?: parents How long has patient lived in current situation?: on and off for years. PT RECENTLY MOVED FROM [place] TO [place] ABOUT ONE WEEK AGO. pt vague about current living situation and does not report why he cannot return home.  What is atmosphere in current home: Temporary  Family History:  Marital status: Single Are you sexually active?: No What is your sexual orientation?: heterosexual Has your sexual activity been affected by drugs, alcohol, medication, or emotional stress?: n/a  Does patient have children?: No  Childhood History:  By whom was/is the patient raised?: Both  parents Additional childhood history information: pt reports that his parents are married; no substance abuse or mental health issues regarding parents per pt report. Description of patient's relationship with caregiver when they were a child: close to parents; good relationship--pt identifies his mother as his primary support perso Patient's description of current relationship with people who raised him/her: close; strained--unable to return home. pt does not explain why he cannot return How were you disciplined when you got in trouble as a child/adolescent?: grounded; yelled at Does patient have siblings?: No Did patient suffer any verbal/emotional/physical/sexual abuse as a child?: No Did patient suffer from severe childhood neglect?: No Has patient ever been sexually abused/assaulted/raped as an adolescent or adult?: No Was the patient ever a victim of a crime or a disaster?: No Witnessed domestic violence?: No Has patient been effected by domestic violence as an adult?: No  Education:  Highest grade of school patient has completed: high school graduate Currently a Consulting civil engineer?: No Learning disability?: No  Employment/Work Situation:   Employment situation: On disability Why is patient on disability: mental health How long has patient been on disability: couple of years Patient's job has been impacted by current illness: No What is the longest time patient has a held a job?: "I don't remember" Where was the patient employed at that time?: n/a  Did You Receive Any Psychiatric Treatment/Services While in the [employer]?: No Are There Guns or Other Weapons in Your Home?: No Are These Weapons Safely Secured?: (n/a)  Financial Resources:   Financial resources: Insurance claims handler, Medicaid Does patient have a Lawyer or guardian?: No  Alcohol/Substance Abuse:   What has been your use of drugs/alcohol within the last 12 months?: suboxone--pt reports he has prescription in [place] for  suboxone but did  not have access once he moved to [place]. pt denies all other substance use; HOWEVER PT TESTED POSITIVE FOR AMPHETAMINES, OPIATES, COCAINE, AND THC. pt reports history of IV heroin and pain pill abuse.  If attempted suicide, did drugs/alcohol play a role in this?: No(pt endorsing SI thoughts) Alcohol/Substance Abuse Treatment Hx: Past Tx, Inpatient If yes, describe treatment: 11/03/15 pt was admitted to [facility] for crisis stabilization and detox.  Has alcohol/substance abuse ever caused legal problems?: No  Social Support System:   Patient's Community Support System: Fair Describe Community Support System: some friend and family social support. pt identifies his mother as his primary support. Type of faith/religion: n/a  How does patient's faith help to cope with current illness?: n/a   Leisure/Recreation:   Leisure and Hobbies: pt declined to share. "none."   Strengths/Needs:   What is the patient's perception of their strengths?: "I don't know."  Patient states they can use these personal strengths during their treatment to contribute to their recovery: "I want to get back on suboxone and get straight again.A" Patient states these barriers may affect/interfere with their treatment: pt minimizing substance use and is focused on getting suboxone reinstated.  Patient states these barriers may affect their return to the community: unable to return home; unsure if he will meet criteria for residential due to wanting to be on suboxone.  Other important information patient would like considered in planning for their treatment: "I don't know."   Discharge Plan:   Currently receiving community mental health services: No Patient states concerns and preferences for aftercare planning are: prefers residential; possibly [facility] if he wants to take suboxone.  Patient states they will know when they are safe and ready for discharge when: "When I'm detoxed and not feeling so bad."  Does  patient have access to transportation?: Yes(bus) Patient description of barriers related to discharge medications: medicaid Plan for living situation after discharge: pt states that he is not able to return home with family. possibly, residential, [facility], or halfway house Will patient be returning to same living situation after discharge?: No  Summary/Recommendations:   Summary and Recommendations (to be completed by the evaluator): Patient is 42yo male living in [place], [place] [facility] county) and recently moved back to [place] from [place] about one week ago. Patient presents to the hospital seeking treatment for suboxone withdrawl, depression, SI, and for medication stabilization. Pt states that he had been prescribed suboxone while in [place] and has not had access to suboxone for the past three days. He denies any substance abuse, however UDS positive for amphetamines, cocaine, THC, and opiatses. Pt has a primary diagnosis of MDD, recurrent, severe. He endorses passive SI and denies currentl AVH/HI. He has no outpatient providers in the area at this time. Recommendations for pt include: crisis stabilization, therapeutic milieu, encourage group attendance and participation, medication management for mood stabilization/detox, and development of comprehensive mental wellness/sobriety plan. CSW assessing for appropriate referrals.   Rona Ravens LCSW 03/02/2018 11:23 AM

## 2018-03-02 NOTE — Plan of Care (Signed)
  Problem: Education: Goal: Emotional status will improve Outcome: Progressing   Problem: Education: Goal: Mental status will improve Outcome: Progressing   Problem: Education: Goal: Verbalization of understanding the information provided will improve Outcome: Progressing   Problem: Activity: Goal: Interest or engagement in activities will improve Outcome: Progressing   Problem: Activity: Goal: Sleeping patterns will improve Outcome: Progressing   

## 2018-03-02 NOTE — BHH Suicide Risk Assessment (Signed)
[facility] Admission and Discharge Suicide Risk Assessment   Nursing information obtained from:  Patient Demographic factors:  Male, Caucasian, Low socioeconomic status, Unemployed Current Mental Status:  Suicidal ideation indicated by patient, Self-harm thoughts Loss Factors:  Financial problems / change in socioeconomic status Historical Factors:  Family history of mental illness or substance abuse Risk Reduction Factors:  Positive coping skills or problem solving skills  Total Time spent with patient: 1 hour Principal Problem: Substance or medication-induced depressive disorder with onset during withdrawal [facility]) Diagnosis:   Patient Active Problem List   Diagnosis Date Noted  . Schizophrenia (HCC) [F20.9] 03/01/2018  . Cocaine-induced mood disorder (HCC) [F14.94] 11/29/2015  . Cocaine use disorder, moderate, dependence (HCC) [F14.20] 11/05/2015  . Cannabis use disorder, moderate, dependence (HCC) [F12.20] 11/05/2015  . Attention deficit hyperactivity disorder (ADHD) [F90.9] 11/05/2015  . Opioid use disorder, moderate, in early remission, on maintenance therapy (HCC) [F11.21] 11/05/2015  . Substance or medication-induced depressive disorder with onset during withdrawal [facility]) [W11.914, F19.24, F32.89] 11/05/2015  . MDD (major depressive disorder), recurrent, severe, with psychosis (HCC) [F33.3] 11/05/2015   Subjective Data:   [name] is a 42 y/o M with history of treatment for depression and polysubstance abuse who [facility] where he presented with worsening depression, SI with plan to overdose or hang self, and worsening symptoms of withdrawal from opiates in context of recent relapse of multiple illicit drugs. Pt had come to [place] from [place] about 1 week ago, and during that time he had a relapse of multiple illicit substances. His UDS was positive for amphetamines, opiates, cocaine, and THC.  Upon initial interview, pt shares, "I just came here to get my  meds readjusted. I was on suboxone until I came here. I came here from [place], [place] about 1 week ago to visit family, and I ran out of suboxone. Then I relapsed on a bunch of stuff. I came here because I wanted to get back on suboxone, but when I found out that you guys don't prescribe it - I figure I'd just be better off getting to somewhere that prescribes it." Pt denies SI/HI/AH/VH. He reports that SI when he came in were feigned, explaining, "I had to say what I had to say to be honest with you - I'm not suicidal." Pt denies depression. He denies symptoms of mania, OCD, and PTSD. He confirms recent relapse of heroin (insufflated), methamphetamine, crack cocaine, and cannabis in the past week. He denies alcohol and tobacco use.  Discussed with patient about treatment options. He declines for all psychotropic medications and referral to outpatient resources. He declines for offer to detox with opiate withdrawal protocol. Pt requests to discharge so that he can plan his return to [facility] where he will seek suboxone treatment. He was able to engage in safety planning including plan to return to [facility] or contact emergency services if he feels unable to maintain his own safety or the safety of others. Pt had no further questions, comments, or concerns.     Continued Clinical Symptoms:  Alcohol Use Disorder Identification Test Final Score (AUDIT): 40 The "Alcohol Use Disorders Identification Test", Guidelines for Use in Primary Care, Second Edition.  World Science writer [facility]). Score between 0-7:  no or low risk or alcohol related problems. Score between 8-15:  moderate risk of alcohol related problems. Score between 16-19:  high risk of alcohol related problems. Score 20 or above:  warrants further diagnostic evaluation for alcohol dependence and treatment.  CLINICAL FACTORS:   Severe Anxiety and/or Agitation Depression:   Severe Alcohol/Substance  Abuse/Dependencies   Musculoskeletal: Strength & Muscle Tone: within normal limits Gait & Station: normal Patient leans: N/A  Psychiatric Specialty Exam: Physical Exam  Nursing note and vitals reviewed.   Review of Systems  Constitutional: Negative for chills and fever.  Respiratory: Negative for cough and shortness of breath.   Cardiovascular: Negative for chest pain.  Gastrointestinal: Negative for abdominal pain, heartburn, nausea and vomiting.  Psychiatric/Behavioral: Negative for depression, hallucinations and suicidal ideas. The patient is not nervous/anxious and does not have insomnia.     Blood pressure 104/74, pulse 71, temperature 98 F (36.7 C), temperature source Oral, resp. rate 18, height 5\' 7"  (1.702 m), weight 67.1 kg.Body mass index is 23.18 kg/m.  General Appearance: Casual  Eye Contact:  Good  Speech:  Clear and Coherent  Volume:  Normal  Mood:  Euthymic  Affect:  Appropriate, Congruent and Constricted  Thought Process:  Coherent and Goal Directed  Orientation:  Full (Time, Place, and Person)  Thought Content:  Logical  Suicidal Thoughts:  No  Homicidal Thoughts:  No  Memory:  Immediate;   Fair Recent;   Fair Remote;   Fair  Judgement:  Poor  Insight:  Lacking  Psychomotor Activity:  Normal  Concentration:  Concentration: Fair  Recall:  [employer] of Knowledge:  Fair  Language:  Fair  Akathisia:  No  Handed:    AIMS (if indicated):     Assets:  Resilience Social Support  ADL's:  Intact  Cognition:  WNL  Sleep:  Number of Hours: 6.75      COGNITIVE FEATURES THAT CONTRIBUTE TO RISK:  None    SUICIDE RISK:   Minimal: No identifiable suicidal ideation.  Patients presenting with no risk factors but with morbid ruminations; may be classified as minimal risk based on the severity of the depressive symptoms  PLAN OF CARE:   -Discharge to outpatient level of care  -Pt declines for all psychotropic medications aside from suboxone  -Pt  declines for referral to outpatient treatment, as he reports that he has outpatient services in [place], [place] and he plans to return there as soon as possible  I certify that inpatient services furnished can reasonably be expected to improve the patient's condition.   Micheal Likens, MD 03/02/2018, 3:08 PM

## 2018-03-02 NOTE — [facility] Suicide Risk Assessment (Signed)
[facility] INPATIENT:  Family/Significant Other Suicide Prevention Education  Suicide Prevention Education:  Contact Attempts: [name] (pt's mother) has been identified by the patient as the family member/significant other with whom the patient will be residing, and identified as the person(s) who will aid the patient in the event of a mental health crisis.  With written consent from the patient, two attempts were made to provide suicide prevention education, prior to and/or following the patient's discharge.  We were unsuccessful in providing suicide prevention education.  A suicide education pamphlet was given to the patient to share with family/significant other.  Date and time of first attempt: 10:40AM on 03/02/18 (unable to leave voicemail).  Date and time of second attempt: 2:49PM on 03/02/18 (unable to leave voicemail).   Rona Ravens LCSW 03/02/2018, 2:53 PM

## 2018-03-02 NOTE — Progress Notes (Signed)
  [facility] Adult Case Management Discharge Plan :  Will you be returning to the same living situation after discharge:  Yes,  returning hom with mother At discharge, do you have transportation home?: Yes,  pt attempting to reach his mother for transport home. bus pass provided Do you have the ability to pay for your medications: Yes,  [facility]  Release of information consent forms completed and submitted to medical records by CSW.   Patient to Follow up at: Follow-up Information    Patient is declining all referrals. Follow up.         Pt was requsting to be placed on suboxone. He requested immediate discharge when he learned that suboxone maintenance was not an option for him at this facility. He declined all referrals and plans to return to [place] at discharge.   Next level of care provider has access to [facility] Link:no  Safety Planning and Suicide Prevention discussed: Yes,  Contact attempts made with pt's mother. SPI pamphlet and Mobile Crisis information provided.   Have you used any form of tobacco in the last 30 days? (Cigarettes, Smokeless Tobacco, Cigars, and/or Pipes): Yes  Has patient been referred to the Quitline?: Patient refused referral  Patient has been referred for addiction treatment: Yes  Rona Ravens, LCSW 03/02/2018, 2:54 PM

## 2018-03-10 ENCOUNTER — Emergency Department (HOSPITAL_COMMUNITY)
Admission: EM | Admit: 2018-03-10 | Discharge: 2018-03-11 | Disposition: A | Discharge status: Home / Self Care | Payer: Medicaid Other | Attending: Emergency Medicine | Admitting: Emergency Medicine

## 2018-03-10 ENCOUNTER — Other Ambulatory Visit: Payer: Self-pay

## 2018-03-10 ENCOUNTER — Encounter (HOSPITAL_COMMUNITY): Payer: Self-pay | Admitting: Emergency Medicine

## 2018-03-10 DIAGNOSIS — F17210 Nicotine dependence, cigarettes, uncomplicated: Secondary | ICD-10-CM | POA: Insufficient documentation

## 2018-03-10 DIAGNOSIS — R110 Nausea: Secondary | ICD-10-CM | POA: Insufficient documentation

## 2018-03-10 DIAGNOSIS — I10 Essential (primary) hypertension: Secondary | ICD-10-CM | POA: Diagnosis not present

## 2018-03-10 DIAGNOSIS — F329 Major depressive disorder, single episode, unspecified: Secondary | ICD-10-CM | POA: Insufficient documentation

## 2018-03-10 DIAGNOSIS — Z79899 Other long term (current) drug therapy: Secondary | ICD-10-CM | POA: Insufficient documentation

## 2018-03-10 DIAGNOSIS — F909 Attention-deficit hyperactivity disorder, unspecified type: Secondary | ICD-10-CM | POA: Insufficient documentation

## 2018-03-10 DIAGNOSIS — F1994 Other psychoactive substance use, unspecified with psychoactive substance-induced mood disorder: Secondary | ICD-10-CM | POA: Diagnosis present

## 2018-03-10 DIAGNOSIS — F1910 Other psychoactive substance abuse, uncomplicated: Secondary | ICD-10-CM | POA: Insufficient documentation

## 2018-03-10 LAB — COMPREHENSIVE METABOLIC PANEL
ALT: 14 U/L (ref 0–44)
AST: 16 U/L (ref 15–41)
Albumin: 4.4 g/dL (ref 3.5–5.0)
Alkaline Phosphatase: 47 U/L (ref 38–126)
Anion gap: 8 (ref 5–15)
BUN: 12 mg/dL (ref 6–20)
CALCIUM: 9.3 mg/dL (ref 8.9–10.3)
CO2: 27 mmol/L (ref 22–32)
CREATININE: 0.94 mg/dL (ref 0.61–1.24)
Chloride: 106 mmol/L (ref 98–111)
GFR calc non Af Amer: >60 mL/min (ref >60)
GLUCOSE: 94 mg/dL (ref 70–99)
Potassium: 3.4 mmol/L — ABNORMAL LOW (ref 3.5–5.1)
SODIUM: 141 mmol/L (ref 135–145)
Total Bilirubin: 0.6 mg/dL (ref 0.3–1.2)
Total Protein: 7.3 g/dL (ref 6.5–8.1)

## 2018-03-10 LAB — RAPID URINE DRUG SCREEN, HOSP PERFORMED
Amphetamines: NOT DETECTED
Barbiturates: NOT DETECTED
Benzodiazepines: NOT DETECTED
Cocaine: NOT DETECTED
Opiates: NOT DETECTED
TETRAHYDROCANNABINOL: POSITIVE — AB

## 2018-03-10 LAB — SALICYLATE LEVEL: Salicylate Lvl: <7 mg/dL (ref 2.8–30.0)

## 2018-03-10 LAB — ETHANOL: Alcohol, Ethyl (B): <10 mg/dL (ref <10)

## 2018-03-10 LAB — CBG MONITORING, ED: GLUCOSE-CAPILLARY: 83 mg/dL (ref 70–99)

## 2018-03-10 LAB — CBC
HCT: 42.7 % (ref 39.0–52.0)
HEMOGLOBIN: 14.5 g/dL (ref 13.0–17.0)
MCH: 32.3 pg (ref 26.0–34.0)
MCHC: 34 g/dL (ref 30.0–36.0)
MCV: 95.1 fL (ref 78.0–100.0)
Platelets: 278 10*3/uL (ref 150–400)
RBC: 4.49 MIL/uL (ref 4.22–5.81)
RDW: 13.3 % (ref 11.5–15.5)
WBC: 8.2 10*3/uL (ref 4.0–10.5)

## 2018-03-10 LAB — ACETAMINOPHEN LEVEL: Acetaminophen (Tylenol), Serum: <10 ug/mL — ABNORMAL LOW (ref 10–30)

## 2018-03-10 MED ORDER — ONDANSETRON 4 MG PO TBDP
4.0000 mg | ORAL_TABLET | Freq: Once | ORAL | Status: AC
  Administered 2018-03-10: 4 mg via ORAL
  Filled 2018-03-10: qty 1

## 2018-03-10 NOTE — ED Notes (Signed)
Pt placed on monitor.  

## 2018-03-10 NOTE — ED Triage Notes (Addendum)
Patient took 15 -15mg  over a 15 hour period. Patient found the bottle and took the medication. Patient is not prescribed it. Patient is not suicidal or homicidal. Last dose of 5mg  was two hours ago.

## 2018-03-10 NOTE — ED Provider Notes (Signed)
[facility]-EMERGENCY DEPT Provider Note   CSN: [id number] Arrival date & time: 03/10/18  2049     History   Chief Complaint Chief Complaint  Patient presents with  . Drug Overdose  . Addiction Problem    HPI [name] is a 42 y.o. male with history of hypertension, anxiety, depression, ADHD, chronic pain, narcotic abuse who presents after overdose with tramadol.  Patient reports he has been taking 15, 50 mg tablets daily over the course of the past 4 days.  Patient reports he takes medication to get high.  He denies any SI, HI, or hallucinations today.  Patient does note that he is depressed.  He does have some associated nausea, but denies any vomiting, abdominal pain, chest pain, shortness of breath.  Patient has attempted to go to rehab several times unsuccessfully.  Kristi at poison control advises observe for 4-6 hours since last ingestion (2000). Watch for seizures.   HPI  Past Medical History:  Diagnosis Date  . Abdominal pain, recurrent    chronic lower abdominal pain  . ADHD (attention deficit hyperactivity disorder)   . Anxiety   . BPH (benign prostatic hyperplasia)   . Chronic pain   . Depression   . Hypertension   . Narcotic abuse (HCC)    opiates    Patient Active Problem List   Diagnosis Date Noted  . Schizophrenia (HCC) 03/01/2018  . Cocaine-induced mood disorder (HCC) 11/29/2015  . Cocaine use disorder, moderate, dependence (HCC) 11/05/2015  . Cannabis use disorder, moderate, dependence (HCC) 11/05/2015  . Attention deficit hyperactivity disorder (ADHD) 11/05/2015  . Opioid use disorder, moderate, in early remission, on maintenance therapy (HCC) 11/05/2015  . 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 MDD (major depressive disorder), recurrent, severe, with psychosis (HCC) 11/05/2015    Past Surgical History:  Procedure Laterality Date  . APPENDECTOMY  08/2005         Home Medications    Prior to Admission medications   Medication Sig Start Date End Date Taking? Authorizing Provider  divalproex (DEPAKOTE) 500 MG DR tablet Take 500 mg by mouth 2 (two) times daily.    Yes [provider]  FLUoxetine (PROZAC) 40 MG capsule Take 40 mg by mouth daily.   Yes [provider]  methylphenidate (RITALIN) 20 MG tablet Take 20 mg by mouth daily.   Yes [provider]  QUEtiapine (SEROQUEL) 100 MG tablet Take 100 mg by mouth at bedtime.   Yes [provider]  albuterol (PROVENTIL HFA;VENTOLIN HFA) 108 (90 Base) MCG/ACT inhaler Inhale 1-2 puffs into the lungs every 6 (six) hours as needed for wheezing or shortness of breath. 03/02/18   Armandina Stammer I, NP  fluticasone (FLOVENT HFA) 110 MCG/ACT inhaler Inhale 2 puffs into the lungs 2 (two) times daily. For allergies 03/02/18   Sanjuana Kava, NP    Family History Family History  Problem Relation Age of Onset  . Hypertension Father   . Prostate cancer Other   . Mental illness Maternal Aunt   . Drug abuse Maternal Aunt     Social History Social History   Tobacco Use  . Smoking status: Current Every Day Smoker    Packs/day: 0.00    Types: Cigarettes  . Smokeless tobacco: Never Used  Substance Use Topics  . Alcohol use: Yes  . Drug use: Yes    Types: Oxycodone, Opium, Cocaine    Comment: Heroin, "pain pills"  Allergies   Escitalopram oxalate and Contrast media [iodinated diagnostic agents]   Review of Systems Review of Systems  Constitutional: Negative for chills and fever.  HENT: Negative for facial swelling and sore throat.   Respiratory: Negative for shortness of breath.   Cardiovascular: Negative for chest pain.  Gastrointestinal: Positive for nausea. Negative for abdominal pain and vomiting.  Genitourinary: Negative for dysuria.  Musculoskeletal: Negative for back pain.  Skin: Negative for rash and wound.  Neurological: Negative for headaches.   Psychiatric/Behavioral: The patient is not nervous/anxious.      Physical Exam Updated Vital Signs BP 102/75 (BP Location: Right Arm)   Pulse 69   Temp 98.2 F (36.8 C) (Oral)   Resp 18   Ht 5\' 7"  (1.702 m)   Wt 68 kg   SpO2 98%   BMI 23.49 kg/m   Physical Exam  Constitutional: He appears well-developed and well-nourished. No distress.  HENT:  Head: Normocephalic and atraumatic.  Mouth/Throat: Oropharynx is clear and moist. No oropharyngeal exudate.  Eyes: Pupils are equal, round, and reactive to light. Conjunctivae are normal. Right eye exhibits no discharge. Left eye exhibits no discharge. No scleral icterus.  Neck: Normal range of motion. Neck supple. No thyromegaly present.  Cardiovascular: Normal rate, regular rhythm, normal heart sounds and intact distal pulses. Exam reveals no gallop and no friction rub.  No murmur heard. Pulmonary/Chest: Effort normal and breath sounds normal. No stridor. No respiratory distress. He has no wheezes. He has no rales.  Abdominal: Soft. Bowel sounds are normal. He exhibits no distension. There is no tenderness. There is no rebound and no guarding.  Musculoskeletal: He exhibits no edema.  Lymphadenopathy:    He has no cervical adenopathy.  Neurological: He is alert. Coordination normal.  Skin: Skin is warm and dry. No rash noted. He is not diaphoretic. No pallor.  Psychiatric: He has a normal mood and affect. He is not actively hallucinating. He expresses no homicidal and no suicidal ideation.  Nursing note and vitals reviewed.    ED Treatments / Results  Labs (all labs ordered are listed, but only abnormal results are displayed) Labs Reviewed  COMPREHENSIVE METABOLIC PANEL - Abnormal; Notable for the following components:      Result Value   Potassium 3.4 (*)    All other components within normal limits  ACETAMINOPHEN LEVEL - Abnormal; Notable for the following components:   Acetaminophen (Tylenol), Serum <10 (*)    All other  components within normal limits  RAPID URINE DRUG SCREEN, HOSP PERFORMED - Abnormal; Notable for the following components:   Tetrahydrocannabinol POSITIVE (*)    All other components within normal limits  ETHANOL  SALICYLATE LEVEL  CBC  CBG MONITORING, ED    EKG None  Radiology No results found.  Procedures Procedures (including critical care time)  Medications Ordered in ED Medications  ondansetron (ZOFRAN-ODT) disintegrating tablet 4 mg (4 mg Oral Given 03/10/18 2327)     Initial Impression / Assessment and Plan / ED Course  I have reviewed the triage vital signs and the nursing notes.  Pertinent labs & imaging results that were available during my care of the patient were reviewed by me and considered in my medical decision making (see chart for details).     Patient is medically cleared by myself and poison control.  Patient's labs are unremarkable.  He denies any SI or HI or hallucinations.  TTS advises AM psych eval, however considering his significant substance abuse over the past few  days.  Disposition deferred to psychiatry.  Final Clinical Impressions(s) / ED Diagnoses   Final diagnoses:  Substance abuse [facility])    ED Discharge Orders    None       Emi Holes, PA-C 03/11/18 0716    Paula Libra, MD 03/11/18 [phone]

## 2018-03-10 NOTE — ED Notes (Signed)
TTS assessment in progress. 

## 2018-03-10 NOTE — ED Notes (Signed)
Pt not in room.

## 2018-03-10 NOTE — ED Notes (Addendum)
Pt stated "I've taken 60 tramadol over the course of the last 4 days.  I was here last week for the same thing.  I have a place to go off [employer].  I think it's [facility].  I've tried to detox about 11-12 times.  I've lost count.  I actually detoxed myself off suboxone 1 time but that lasted only 4 days.  I think I might need to go back on suboxone.  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

## 2018-03-10 NOTE — H&P (Signed)
Behavioral Health Medical Screening Exam  [name] is an 42 y.o. male who presented to [facility] as a walk-in. Was initially informed that patient overdosed on a bottle of tramadol. Patient reports that he took 15 50 mg tramadol tablets since this morning. States that he has taken 60 tablets over 4 days. Parents report that he was complaining of dizziness and almost passed out when they were walking in the building. Patient reports some dizziness and extreme thirst. Denies chest pain, SOB, N/V/D.   Total Time spent with patient: 15 minutes  Psychiatric Specialty Exam: Physical Exam  Constitutional: He is oriented to person, place, and time. He appears well-developed and well-nourished. No distress.  HENT:  Head: Normocephalic and atraumatic.  Right Ear: External ear normal.  Left Ear: External ear normal.  Eyes: Pupils are equal, round, and reactive to light. Right eye exhibits no discharge. Left eye exhibits no discharge.  Respiratory: Effort normal. No respiratory distress.  Musculoskeletal: Normal range of motion.  Neurological: He is alert and oriented to person, place, and time.  Skin: Skin is warm and dry. He is not diaphoretic.  Psychiatric: His speech is normal. His mood appears anxious. He exhibits a depressed mood.    Review of Systems  Constitutional: Negative for chills, fever and weight loss.  Respiratory: Negative for shortness of breath.   Cardiovascular: Negative for chest pain.  Gastrointestinal: Negative for diarrhea, nausea and vomiting.  Neurological: Positive for dizziness.  Psychiatric/Behavioral: Positive for depression and substance abuse. The patient is nervous/anxious.     Blood pressure 118/78, pulse 84, temperature 98.7 F (37.1 C), temperature source Oral, resp. rate 16, height 5\' 7"  (1.702 m), weight 68 kg, SpO2 99 %.Body mass index is 23.49 kg/m.  General Appearance: Casual and Well Groomed  Eye Contact:  Good  Speech:  Clear and Coherent  Volume:   Normal  Mood:  Anxious, Depressed and Hopeless  Affect:  Congruent and Depressed    Musculoskeletal: Strength & Muscle Tone: within normal limits Gait & Station: normal  Blood pressure 118/78, pulse 84, temperature 98.7 F (37.1 C), temperature source Oral, resp. rate 16, height 5\' 7"  (1.702 m), weight 68 kg, SpO2 99 %.  Recommendations:  Based on my evaluation the patient appears to have an emergency medical condition for which I recommend the patient be transferred to the emergency department for further evaluation.  Jackelyn Poling, NP 03/10/2018, 9:01 PM

## 2018-03-11 DIAGNOSIS — F1994 Other psychoactive substance use, unspecified with psychoactive substance-induced mood disorder: Secondary | ICD-10-CM | POA: Diagnosis present

## 2018-03-11 NOTE — Discharge Instructions (Signed)
For your behavioral health needs, you are advised to follow up with your primary care provider:       [facility]      [address]      [place], [place] [address]      [phone]  You have indicated that you have an appointment [facility] on Wednesday, March 17, 2018 for your substance abuse treatment needs.  You are advised to keep this appointment:       [facility]      [address]      [place], [place] [address]      [phone]

## 2018-03-11 NOTE — BH Assessment (Signed)
[facility]  Per Juanetta BeetsJacqueline Norman, DO, this pt does not require psychiatric hospitalization at this time.  [facility] with recommendation to continue treatment with his PCP for his mental health needs, and to keep his appointment with [facility], previously scheduled for 03/17/2018, to address his substance abuse treatment needs.  This has been included in pt's discharge instructions.  Pt's nurse, Ginger, has been notified.  Stephen Canninghomas Teva Bronkema, MA Triage Specialist [phone]

## 2018-03-11 NOTE — [facility] Suicide Risk Assessment (Cosign Needed)
Suicide Risk Assessment  Discharge Assessment   [facility]   Principal Problem: Substance induced mood disorder [facility](HCC) Discharge Diagnoses:  Patient Active Problem List   Diagnosis Date Noted  . Schizophrenia (HCC) [F20.9] 03/01/2018  . Cocaine-induced mood disorder (HCC) [F14.94] 11/29/2015  . Cocaine use disorder, moderate, dependence (HCC) [F14.20] 11/05/2015  . Cannabis use disorder, moderate, dependence (HCC) [F12.20] 11/05/2015  . Attention deficit hyperactivity disorder (ADHD) [F90.9] 11/05/2015  . Opioid use disorder, moderate, in early remission, on maintenance therapy (HCC) [F11.21] 11/05/2015  . Substance or medication-induced depressive disorder with onset during withdrawal [facility](HCC) [Z61.096[F19.239, F19.24, F32.89] 11/05/2015  . MDD (major depressive disorder), recurrent, severe, with psychosis (HCC) [F33.3] 11/05/2015    Total Time spent with patient: 30 minutes  Musculoskeletal: Strength & Muscle Tone: within normal limits Gait & Station: normal Patient leans: N/A  Psychiatric Specialty Exam:   Blood pressure 102/75, pulse 69, temperature 98.2 F (36.8 C), temperature source Oral, resp. rate 18, height 5\' 7"  (1.702 m), weight 68 kg, SpO2 98 %.Body mass index is 23.49 kg/m.  General Appearance: Casual  Eye Contact::  Good  Speech:  Clear and Coherent and Normal Rate409  Volume:  Normal  Mood:  Anxious  Affect:  Congruent  Thought Process:  Coherent, Goal Directed and Linear  Orientation:  Full (Time, Place, and Person)  Thought Content:  Logical  Suicidal Thoughts:  No  Homicidal Thoughts:  No  Memory:  Immediate;   Good Recent;   Fair Remote;   Fair  Judgement:  Poor  Insight:  Lacking  Psychomotor Activity:  Normal  Concentration:  Good  Recall:  Good  Fund of Knowledge:Good  Language: Good  Akathisia:  No  Handed:  Right  AIMS (if indicated):     Assets:  Communication Skills Desire for Improvement Financial  Resources/Insurance Housing Social Support Transportation Vocational/Educational  Sleep:     Cognition: WNL  ADL's:  Intact   Mental Status Per Nursing Assessment::   On Admission:   Pt was seen and chart reviewed with Dr Sharma CovertNorman and treatment team. Pt stated he took all of the tramadol yesterday over many hours and this was only to keep himself from being sick because he currently can not get his suboxone prescribed to him. He is from [place] and his prescription could not cross state lines. He is seeing a PCP for his mental health medications and states he is taking them regularly. Pt is living by himself but has his parents here in [place] and they are very supportive. Pt is stable and psychiatrically clear for discharge.   Demographic Factors:  Male, Caucasian and Unemployed  Loss Factors: Financial problems/change in socioeconomic status  Historical Factors: Impulsivity  Risk Reduction Factors:   Sense of responsibility to family and Living with another person, especially a relative  Continued Clinical Symptoms:  Severe Anxiety and/or Agitation Depression:   Impulsivity Alcohol/Substance Abuse/Dependencies  Cognitive Features That Contribute To Risk:  Closed-mindedness    Suicide Risk:  Minimal: No identifiable suicidal ideation.  Patients presenting with no risk factors but with morbid ruminations; may be classified as minimal risk based on the severity of the depressive symptoms    Plan Of Care/Follow-up recommendations:  Activity:  as tolerated  Diet:  Heart healthy  Laveda AbbeLaurie Britton Erica Richwine, NP 03/11/2018, 11:31 AM

## 2018-03-11 NOTE — Progress Notes (Signed)
Per Nira ConnJason Berry, NP pt is recommended for continued observation for safety and stabilization and to be reassessed in the AM by psych. EDP Law, Waylan BogaAlexandra M, PA-C and pt's nurse Berenda Moraleeagan, Elaine H, RN have been advised.  Princess BruinsAquicha Aariah Godette, MSW, LCSW Therapeutic Triage Specialist  [phone]

## 2018-03-11 NOTE — BH Assessment (Addendum)
Assessment Note  [name] is an 42 y.o. male who presents to the ED voluntarily due to OD on " 60 tramadol over the course of the last 4 days". [facility] and [facility] due to the [facility]. Pt states the OD was intentional, however he denies any intent to self-harm. When asked why he took the medication pt stated "I just wanted to get high." Pt [facility] due to substance abuse and depression. Per chart review, [facility] was given resources for Clinics that provide Suboxone and take Medicaid. Pt states he has not followed up with those resources. Pt states he has a [facility] Wednesday for substance abuse.  Pt denies SI, HI, and denies AVH. Pt states he has been awake for 2 days due to abusing her Ritalin medication and feeling "amped up." Pt denies specific stressors aside from relocating to [place] several weeks ago.   Per Nira ConnJason Berry, NP pt is recommended for continued observation for safety and stabilization and to be reassessed in the AM by psych. EDP Law, Waylan BogaAlexandra M, PA-C and pt's nurse Berenda Moraleeagan, Elaine H, RN have been advised.  Diagnosis: Polysubstance abuse   Past Medical History:  Past Medical History:  Diagnosis Date  . Abdominal pain, recurrent    chronic lower abdominal pain  . ADHD (attention deficit hyperactivity disorder)   . Anxiety   . BPH (benign prostatic hyperplasia)   . Chronic pain   . Depression   . Hypertension   . Narcotic abuse (HCC)    opiates    Past Surgical History:  Procedure Laterality Date  . APPENDECTOMY  08/2005    Family History:  Family History  Problem Relation Age of Onset  . Hypertension Father   . Prostate cancer Other   . Mental illness Maternal Aunt   . Drug abuse Maternal Aunt     Social History:  reports that he has been smoking cigarettes. He has been smoking about 0.00 packs per day. He has  never used smokeless tobacco. He reports that he drinks alcohol. He reports that he has current or past drug history. Drugs: Oxycodone, Opium, and Cocaine.  Additional Social History:  Alcohol / Drug Use Pain Medications: See MAR Prescriptions: See MAR Over the Counter: See MAR History of alcohol / drug use?: Yes Longest period of sobriety (when/how long): 1 year Negative Consequences of Use: Financial, Legal, Personal relationships Withdrawal Symptoms: Diarrhea, Sweats, Tingling, Weakness, Nausea / Vomiting, Irritability, Tremors, Fever / Chills, Cramps, Agitation Substance #1 Name of Substance 1: Pain pills 1 - Age of First Use: unknown 1 - Amount (size/oz): varied 1 - Frequency: daily 1 - Duration: ongoing 1 - Last Use / Amount: 03/10/18 Substance #2 Name of Substance 2: Cannabis 2 - Age of First Use: unknown 2 - Amount (size/oz): varies 2 - Frequency: pt states "whenever I can get it" 2 - Duration: ongoing 2 - Last Use / Amount: "last week"  CIWA: CIWA-Ar BP: 111/72 Pulse Rate: 76 COWS:    Allergies:  Allergies  Allergen Reactions  . Escitalopram Oxalate Hives and Swelling  . Contrast Media [Iodinated Diagnostic Agents] Nausea And Vomiting    Home Medications:  (Not in a hospital admission)  OB/GYN Status:  No LMP for male patient.  General Assessment Data Location of Assessment: [facility] ED TTS Assessment: In system Is this a Tele or Face-to-Face  Assessment?: Face-to-Face Is this an Initial Assessment or a Re-assessment for this encounter?: Initial Assessment Language Other than English: No What gender do you identify as?: Male Marital status: Single Pregnancy Status: No Living Arrangements: Parent Can pt return to current living arrangement?: Yes Admission Status: Voluntary Is patient capable of signing voluntary admission?: Yes Referral Source: Self/Family/Friend Insurance type: MCD     Crisis Care Plan Living Arrangements: Parent Name of Psychiatrist:  none Name of Therapist: none  Education Status Is patient currently in school?: No Is the patient employed, unemployed or receiving disability?: Unemployed  Risk to self with the past 6 months Suicidal Ideation: No-Not Currently/Within Last 6 Months Has patient been a risk to self within the past 6 months prior to admission? : Yes Suicidal Intent: No Has patient had any suicidal intent within the past 6 months prior to admission? : No Is patient at risk for suicide?: No Suicidal Plan?: No-Not Currently/Within Last 6 Months Has patient had any suicidal plan within the past 6 months prior to admission? : Yes Specify Current Suicidal Plan: 2 WEEKS AGO PT EXPRESSED THOUGHTS TO HANG HIMSELF OR OD ON MEDICATION Access to Means: No What has been your use of drugs/alcohol within the last 12 months?: daily pain pills Previous Attempts/Gestures: No Triggers for Past Attempts: None known Intentional Self Injurious Behavior: None Family Suicide History: No Recent stressful life event(s): Turmoil (Comment)(substance abuse, recently relocated) Persecutory voices/beliefs?: No Depression: No Substance abuse history and/or treatment for substance abuse?: Yes Suicide prevention information given to non-admitted patients: Not applicable  Risk to Others within the past 6 months Homicidal Ideation: No Does patient have any lifetime risk of violence toward others beyond the six months prior to admission? : No Thoughts of Harm to Others: No Current Homicidal Intent: No Current Homicidal Plan: No Access to Homicidal Means: No History of harm to others?: No Assessment of Violence: None Noted Does patient have access to weapons?: No Criminal Charges Pending?: No Does patient have a court date: No Is patient on probation?: No  Psychosis Hallucinations: None noted Delusions: None noted  Mental Status Report Appearance/Hygiene: Unremarkable, In scrubs Eye Contact: Good Motor Activity: Freedom of  movement Speech: Logical/coherent Level of Consciousness: Alert Mood: Euthymic Affect: Appropriate to circumstance Anxiety Level: None Thought Processes: Relevant, Coherent Judgement: Impaired Orientation: Time, Situation, Appropriate for developmental age, Place, Person Obsessive Compulsive Thoughts/Behaviors: None  Cognitive Functioning Concentration: Normal Memory: Remote Intact, Recent Intact Is patient IDD: No Insight: Poor Impulse Control: Poor Appetite: Fair Have you had any weight changes? : No Change Sleep: Decreased Total Hours of Sleep: 1 Vegetative Symptoms: None  ADLScreening [facility] Assessment Services) Patient's cognitive ability adequate to safely complete daily activities?: Yes Patient able to express need for assistance with ADLs?: Yes Independently performs ADLs?: Yes (appropriate for developmental age)  Prior Inpatient Therapy Prior Inpatient Therapy: Yes Prior Therapy Dates: 2019 Prior Therapy Facilty/Provider(s): [facility] Reason for Treatment: MDD, SI  Prior Outpatient Therapy Prior Outpatient Therapy: Yes Prior Therapy Dates: 2019 Prior Therapy Facilty/Provider(s): facility in [place] Reason for Treatment: suboxone clinic  Does patient have an ACCT team?: No Does patient have Intensive In-House Services?  : No Does patient have [facility] services? : No Does patient have P4CC services?: No  ADL Screening (condition at time of admission) Patient's cognitive ability adequate to safely complete daily activities?: Yes Is the patient deaf or have difficulty hearing?: No Does the patient have difficulty seeing, even when wearing glasses/contacts?: No Does the patient have difficulty concentrating,  remembering, or making decisions?: No Patient able to express need for assistance with ADLs?: Yes Does the patient have difficulty dressing or bathing?: No Independently performs ADLs?: Yes (appropriate for developmental age) Does the patient have difficulty walking  or climbing stairs?: No Weakness of Legs: None Weakness of Arms/Hands: None  Home Assistive Devices/Equipment Home Assistive Devices/Equipment: None    Abuse/Neglect Assessment (Assessment to be complete while patient is alone) Abuse/Neglect Assessment Can Be Completed: Yes Physical Abuse: Denies Verbal Abuse: Denies Sexual Abuse: Denies Exploitation of patient/patient's resources: Denies Self-Neglect: Denies     Merchant navy officer (For Healthcare) Does Patient Have a Medical Advance Directive?: No Would patient like information on creating a medical advance directive?: No - Patient declined          Disposition: Per Nira Conn, NP pt is recommended for continued observation for safety and stabilization and to be reassessed in the AM by psych. EDP Law, Waylan Boga, PA-C and pt's nurse Berenda Morale, RN have been advised.  Disposition Initial Assessment Completed for this Encounter: Yes Disposition of Patient: (overnight OBS pending AM psych assessment) Patient refused recommended treatment: No  On Site Evaluation by:   Reviewed with Physician:    Karolee Ohs 03/11/2018 12:48 AM

## 2018-03-11 NOTE — ED Notes (Signed)
Received verbal order to d/c cardiac monitoring by Trinna PostAlex, PA-C.

## 2018-03-20 ENCOUNTER — Emergency Department (HOSPITAL_COMMUNITY)
Admission: EM | Admit: 2018-03-20 | Discharge: 2018-03-20 | Disposition: A | Discharge status: Home / Self Care | Payer: Medicaid Other | Attending: Emergency Medicine | Admitting: Emergency Medicine

## 2018-03-20 ENCOUNTER — Ambulatory Visit (HOSPITAL_COMMUNITY)
Admission: RE | Admit: 2018-03-20 | Discharge: 2018-03-20 | Disposition: A | Discharge status: Home / Self Care | Payer: Medicaid Other | Attending: Psychiatry | Admitting: Psychiatry

## 2018-03-20 ENCOUNTER — Encounter (HOSPITAL_COMMUNITY): Payer: Self-pay | Admitting: Emergency Medicine

## 2018-03-20 ENCOUNTER — Ambulatory Visit (HOSPITAL_COMMUNITY)
Admission: EM | Admit: 2018-03-20 | Discharge: 2018-03-20 | Disposition: A | Discharge status: Home / Self Care | Payer: Medicaid Other | Attending: Radiology | Admitting: Radiology

## 2018-03-20 ENCOUNTER — Other Ambulatory Visit: Payer: Self-pay

## 2018-03-20 DIAGNOSIS — R45851 Suicidal ideations: Secondary | ICD-10-CM | POA: Insufficient documentation

## 2018-03-20 DIAGNOSIS — F17210 Nicotine dependence, cigarettes, uncomplicated: Secondary | ICD-10-CM | POA: Diagnosis not present

## 2018-03-20 DIAGNOSIS — F1290 Cannabis use, unspecified, uncomplicated: Secondary | ICD-10-CM | POA: Insufficient documentation

## 2018-03-20 DIAGNOSIS — R112 Nausea with vomiting, unspecified: Secondary | ICD-10-CM | POA: Diagnosis not present

## 2018-03-20 DIAGNOSIS — G8929 Other chronic pain: Secondary | ICD-10-CM | POA: Insufficient documentation

## 2018-03-20 DIAGNOSIS — F333 Major depressive disorder, recurrent, severe with psychotic symptoms: Secondary | ICD-10-CM | POA: Diagnosis present

## 2018-03-20 DIAGNOSIS — F329 Major depressive disorder, single episode, unspecified: Secondary | ICD-10-CM | POA: Diagnosis not present

## 2018-03-20 DIAGNOSIS — I10 Essential (primary) hypertension: Secondary | ICD-10-CM | POA: Insufficient documentation

## 2018-03-20 DIAGNOSIS — F909 Attention-deficit hyperactivity disorder, unspecified type: Secondary | ICD-10-CM | POA: Diagnosis not present

## 2018-03-20 DIAGNOSIS — F1994 Other psychoactive substance use, unspecified with psychoactive substance-induced mood disorder: Secondary | ICD-10-CM | POA: Diagnosis present

## 2018-03-20 DIAGNOSIS — R443 Hallucinations, unspecified: Secondary | ICD-10-CM

## 2018-03-20 DIAGNOSIS — F209 Schizophrenia, unspecified: Secondary | ICD-10-CM | POA: Diagnosis not present

## 2018-03-20 DIAGNOSIS — Z79899 Other long term (current) drug therapy: Secondary | ICD-10-CM | POA: Diagnosis not present

## 2018-03-20 LAB — COMPREHENSIVE METABOLIC PANEL
ALBUMIN: 4.5 g/dL (ref 3.5–5.0)
ALK PHOS: 47 U/L (ref 38–126)
ALT: 16 U/L (ref 0–44)
ANION GAP: 8 (ref 5–15)
AST: 17 U/L (ref 15–41)
BUN: 9 mg/dL (ref 6–20)
CALCIUM: 9.2 mg/dL (ref 8.9–10.3)
CO2: 31 mmol/L (ref 22–32)
Chloride: 102 mmol/L (ref 98–111)
Creatinine, Ser: 0.72 mg/dL (ref 0.61–1.24)
GFR calc non Af Amer: >60 mL/min (ref >60)
Glucose, Bld: 97 mg/dL (ref 70–99)
POTASSIUM: 3 mmol/L — AB (ref 3.5–5.1)
SODIUM: 141 mmol/L (ref 135–145)
TOTAL PROTEIN: 7.3 g/dL (ref 6.5–8.1)
Total Bilirubin: 1 mg/dL (ref 0.3–1.2)

## 2018-03-20 LAB — CBC
HEMATOCRIT: 42.9 % (ref 39.0–52.0)
Hemoglobin: 14.1 g/dL (ref 13.0–17.0)
MCH: 31.7 pg (ref 26.0–34.0)
MCHC: 32.9 g/dL (ref 30.0–36.0)
MCV: 96.4 fL (ref 78.0–100.0)
Platelets: 262 10*3/uL (ref 150–400)
RBC: 4.45 MIL/uL (ref 4.22–5.81)
RDW: 13.5 % (ref 11.5–15.5)
WBC: 7.6 10*3/uL (ref 4.0–10.5)

## 2018-03-20 LAB — SALICYLATE LEVEL: Salicylate Lvl: <7 mg/dL (ref 2.8–30.0)

## 2018-03-20 LAB — RAPID URINE DRUG SCREEN, HOSP PERFORMED
AMPHETAMINES: POSITIVE — AB
BARBITURATES: NOT DETECTED
BENZODIAZEPINES: NOT DETECTED
Cocaine: POSITIVE — AB
Opiates: NOT DETECTED
Tetrahydrocannabinol: POSITIVE — AB

## 2018-03-20 LAB — ETHANOL: Alcohol, Ethyl (B): <10 mg/dL (ref <10)

## 2018-03-20 LAB — ACETAMINOPHEN LEVEL: Acetaminophen (Tylenol), Serum: <10 ug/mL — ABNORMAL LOW (ref 10–30)

## 2018-03-20 MED ORDER — ONDANSETRON HCL 4 MG PO TABS: 4.0000 mg | ORAL_TABLET | Freq: Four times a day (QID) | ORAL | 0 refills | Status: DC

## 2018-03-20 MED ORDER — ONDANSETRON 4 MG PO TBDP
ORAL_TABLET | ORAL | Status: AC
  Filled 2018-03-20: qty 1

## 2018-03-20 MED ORDER — ONDANSETRON 4 MG PO TBDP
4.0000 mg | ORAL_TABLET | Freq: Once | ORAL | Status: AC
  Administered 2018-03-20: 4 mg via ORAL

## 2018-03-20 MED ORDER — POTASSIUM CHLORIDE CRYS ER 20 MEQ PO TBCR
60.0000 meq | EXTENDED_RELEASE_TABLET | Freq: Once | ORAL | Status: AC
  Administered 2018-03-20: 60 meq via ORAL
  Filled 2018-03-20: qty 3

## 2018-03-20 NOTE — Discharge Instructions (Addendum)
Please follow-up with the outpatient resources provided to you for further evaluation and treatment.  Please return to emergency department if you develop any new or worsening symptoms.  For your mental health needs, please follow up at:   Neila GearVincent Izediuno, MD [facility] [address] [place], [place] [address] [phone]  [facility] [address] [place], [place] [address] [phone]

## 2018-03-20 NOTE — H&P (Signed)
Behavioral Health Medical Screening Exam  [name] is an 42 y.o. male. -Patient is a walk in patient who [facility].Patient says he has been seeing "a group of hispanics following me around."  Patient says he hears voices telling him to harm the hispanic people who are following him around.  Patient says that they say "you need to get rid of them before they hurt you."  Patient says he has had some suicidal thoughts but he has no plan or intention to kill himself. Three weeks ago he was having thoughts of killing himself by overdosing or hanging self.   Total Time spent with patient: 15 minutes  Psychiatric Specialty Exam: Physical Exam  Constitutional: He is oriented to person, place, and time. He appears well-developed and well-nourished. No distress.  HENT:  Head: Normocephalic.  Eyes: Pupils are equal, round, and reactive to light.  Respiratory: Effort normal and breath sounds normal. No respiratory distress.  GI: Soft.  Neurological: He is alert and oriented to person, place, and time. No cranial nerve deficit.  Skin: Skin is warm and dry. He is not diaphoretic.  Psychiatric: His speech is normal. His mood appears anxious. He is agitated. Thought content is paranoid. Cognition and memory are impaired. He expresses impulsivity. He exhibits a depressed mood. He expresses suicidal ideation. He expresses suicidal plans.    Review of Systems  Constitutional: Negative for chills, diaphoresis, fever, malaise/fatigue and weight loss.  Psychiatric/Behavioral: Positive for depression, hallucinations, substance abuse and suicidal ideas. The patient is nervous/anxious.   All other systems reviewed and are negative.   Blood pressure 115/74, pulse (!) 117, temperature 97.8 F (36.6 C), temperature source Oral, resp. rate 19, SpO2 100 %.There is no height or weight on file to calculate BMI.  General Appearance: Casual  Eye Contact:  Fair  Speech:  Clear and Coherent   Volume:  Normal  Mood:  Anxious and Depressed  Affect:  Congruent  Thought Process:  Goal Directed  Orientation:  Full (Time, Place, and Person)  Thought Content:  Logical  Suicidal Thoughts:  Yes.  without intent/plan  Homicidal Thoughts:  No  Memory:  Immediate;   Fair  Judgement:  Fair  Insight:  Fair  Psychomotor Activity:  Normal  Concentration: Concentration: Fair  Recall:  [employer]Fair  Fund of Knowledge:Fair  Language: Fair  Akathisia:  Negative  Handed:  Right  AIMS (if indicated):     Assets:  Desire for Improvement  Sleep:       Musculoskeletal: Strength & Muscle Tone: within normal limits Gait & Station: normal Patient leans: Right and N/A  Blood pressure 115/74, pulse (!) 117, temperature 97.8 F (36.6 C), temperature source Oral, resp. rate 19, SpO2 100 %.  Recommendations:  Based on my evaluation the patient appears to have an emergency medical condition for which I recommend the patient be transferred to the emergency department for further evaluation.  Stephen HoughSpencer E Kam Kushnir, PA-C 03/20/2018, 3:19 AM

## 2018-03-20 NOTE — ED Provider Notes (Signed)
[facility]-EMERGENCY DEPT Provider Note   CSN: [id number] Arrival date & time: 03/20/18  0127     History   Chief Complaint Chief Complaint  Patient presents with  . Medical Clearance    HPI [name] is a 42 y.o. male.  [name] is a 42 y.o. Male with a history of schizophrenia, depression, narcotic abuse, anxiety and ADHD, who presents to the emergency department for evaluation of hallucinations and suicidal ideations.  He reports auditory hallucinations but reports these are not commanding voices, is unable to elaborate anymore on what these hallucinations consist of, denies any visual hallucinations.  He reports waxing and waning suicidal thoughts that worsened today.  No HI.  He endorses marijuana use but denies any other substance abuse.  He denies any focal medical complaints no fevers or recent illness.  Denies chest pain, shortness of breath, abdominal pain, vomiting, headaches or vision changes.  Reports he is on medications to help with his psychiatric illness and reports compliance with, last took them this morning.     Past Medical History:  Diagnosis Date  . Abdominal pain, recurrent    chronic lower abdominal pain  . ADHD (attention deficit hyperactivity disorder)   . Anxiety   . BPH (benign prostatic hyperplasia)   . Chronic pain   . Depression   . Hypertension   . Narcotic abuse (HCC)    opiates    Patient Active Problem List   Diagnosis Date Noted  . Substance induced mood disorder (HCC) 03/11/2018  . Schizophrenia (HCC) 03/01/2018  . Cocaine-induced mood disorder (HCC) 11/29/2015  . Cocaine use disorder, moderate, dependence (HCC) 11/05/2015  . Cannabis use disorder, moderate, dependence (HCC) 11/05/2015  . Attention deficit hyperactivity disorder (ADHD) 11/05/2015  . Opioid use disorder, moderate, in early remission, on maintenance therapy (HCC) 11/05/2015  . 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 MDD (major depressive disorder), recurrent, severe, with psychosis (HCC) 11/05/2015    Past Surgical History:  Procedure Laterality Date  . APPENDECTOMY  08/2005        Home Medications    Prior to Admission medications   Medication Sig Start Date End Date Taking? Authorizing Provider  albuterol (PROVENTIL HFA;VENTOLIN HFA) 108 (90 Base) MCG/ACT inhaler Inhale 1-2 puffs into the lungs every 6 (six) hours as needed for wheezing or shortness of breath. 03/02/18   Armandina StammerNwoko, Agnes I, NP  divalproex (DEPAKOTE) 500 MG DR tablet Take 500 mg by mouth 2 (two) times daily.     [provider]  FLUoxetine (PROZAC) 40 MG capsule Take 40 mg by mouth daily.    [provider]  fluticasone (FLOVENT HFA) 110 MCG/ACT inhaler Inhale 2 puffs into the lungs 2 (two) times daily. For allergies 03/02/18   Armandina StammerNwoko, Agnes I, NP  methylphenidate (RITALIN) 20 MG tablet Take 20 mg by mouth daily.    [provider]  QUEtiapine (SEROQUEL) 100 MG tablet Take 100 mg by mouth at bedtime.    [provider]    Family History Family History  Problem Relation Age of Onset  . Hypertension Father   . Prostate cancer Other   . Mental illness Maternal Aunt   . Drug abuse Maternal Aunt     Social History Social History   Tobacco Use  . Smoking status: Current Every Day Smoker    Packs/day: 0.00    Types: Cigarettes  . Smokeless tobacco: Never Used  Substance Use Topics  .  Alcohol use: Yes  . Drug use: Yes    Types: Oxycodone, Opium, Cocaine    Comment: Heroin, "pain pills"     Allergies   Escitalopram oxalate and Contrast media [iodinated diagnostic agents]   Review of Systems Review of Systems  Constitutional: Negative for chills and fever.  HENT: Negative.   Eyes: Negative for visual disturbance.  Respiratory: Negative for cough and shortness of breath.   Cardiovascular: Negative for chest pain.  Gastrointestinal:  Negative for abdominal pain, nausea and vomiting.  Genitourinary: Negative for dysuria and frequency.  Musculoskeletal: Negative for arthralgias and myalgias.  Skin: Negative for color change and rash.  Neurological: Negative for dizziness, speech difficulty and light-headedness.  Psychiatric/Behavioral: Positive for hallucinations and suicidal ideas.     Physical Exam Updated Vital Signs BP 115/74 (BP Location: Right Arm)   Pulse (!) 117   Temp 97.8 F (36.6 C) (Oral)   Resp 19   SpO2 100%   Physical Exam  Constitutional: He is oriented to person, place, and time. He appears well-developed and well-nourished. No distress.  HENT:  Head: Normocephalic and atraumatic.  Eyes: Right eye exhibits no discharge. Left eye exhibits no discharge.  Cardiovascular: Normal rate, regular rhythm, normal heart sounds and intact distal pulses.  Pulmonary/Chest: Effort normal and breath sounds normal. No respiratory distress.  Respirations equal and unlabored, patient able to speak in full sentences, lungs clear to auscultation bilaterally  Abdominal: Soft. Bowel sounds are normal. He exhibits no distension and no mass. There is no tenderness. There is no guarding.  Abdomen soft, nondistended, nontender to palpation in all quadrants without guarding or peritoneal signs  Musculoskeletal: He exhibits no edema or deformity.  Neurological: He is alert and oriented to person, place, and time. Coordination normal.  Speech is clear, able to follow commands CN III-XII intact Normal strength in upper and lower extremities bilaterally including dorsiflexion and plantar flexion, strong and equal grip strength Sensation normal to light and sharp touch Moves extremities without ataxia, coordination intact  Skin: Skin is warm and dry. Capillary refill takes less than 2 seconds. He is not diaphoretic.  Psychiatric: His speech is normal. He is withdrawn. He is not actively hallucinating. Thought content is  paranoid. He exhibits a depressed mood. He expresses suicidal ideation. He expresses no homicidal ideation. He expresses no suicidal plans.  Nursing note and vitals reviewed.    ED Treatments / Results  Labs (all labs ordered are listed, but only abnormal results are displayed) Labs Reviewed  RAPID URINE DRUG SCREEN, HOSP PERFORMED - Abnormal; Notable for the following components:      Result Value   Cocaine POSITIVE (*)    Amphetamines POSITIVE (*)    Tetrahydrocannabinol POSITIVE (*)    All other components within normal limits  COMPREHENSIVE METABOLIC PANEL  ETHANOL  SALICYLATE LEVEL  ACETAMINOPHEN LEVEL  CBC    EKG EKG Interpretation  Date/Time:  Saturday March 20 2018 04:28:21 EDT Ventricular Rate:  91 PR Interval:    QRS Duration: 91 QT Interval:  360 QTC Calculation: 443 R Axis:   -10 Text Interpretation:  Sinus rhythm Abnormal R-wave progression, early transition Confirmed by Geoffery Lyons ([id number]) on 03/20/2018 4:31:12 AM   Radiology No results found.  Procedures Procedures (including critical care time)  Medications Ordered in ED Medications - No data to display   Initial Impression / Assessment and Plan / ED Course  I have reviewed the triage vital signs and the nursing notes.  Pertinent labs & imaging  results that were available during my care of the patient were reviewed by me and considered in my medical decision making (see chart for details).  Patient presents for evaluation of hallucinations and suicidal ideations without specific plan.  Denies HI.  Reports compliance with his medications.  No focal medical complaints.  Initially tachycardic but all other vitals normal and this resolved without intervention.  No focal findings on exam.  Will get medical screening labs and EKG.  TTS consult placed.  Patient will benefit from psychiatric evaluation to establish safe disposition.  Shift change care signed out to [facility] law PA-C pending lab evaluation  and TTS evaluation.  Patient UDS is positive for THC.  EKG is unremarkable.  Final Clinical Impressions(s) / ED Diagnoses   Final diagnoses:  Suicidal ideation  Hallucinations    ED Discharge Orders    None       Dartha Lodge, [place] 03/20/18 1610    Geoffery Lyons, MD 03/20/18 [phone]

## 2018-03-20 NOTE — BH Assessment (Signed)
Assessment Note  [name] is an 42 y.o. male.  -Patient is a walk in patient who came to [facility] by himself on foot.  Patient says he has been seeing "a group of hispanics following me around."  Patient says he hears voices telling him to harm the hispanic people who are following him around.  Patient says that they say "you need to get rid of them before they hurt you."  Patient says he has had some suicidal thoughts but he has no plan or intention to kill himself. Three weeks ago he was having thoughts of killing himself by overdosing or hanging self.    No HI other than voices telling him to harm imaginary people.  Patient visits [place] sometimes and he says he sees the same group of hispanic people following him there.  Patient is using marijuana daily.  He used some prior to arrival.  Patient was seen by Donell Sievert, PA.  He recommends patient be sent to [facility] to stay and be seen by psychiatry in AM.  No appropriate beds at [facility] a this time.  Clinician informed charge nurse Reita Cliche at [facility].  [place] provided transportation.  Diagnosis: F20.9 Schizophrenia  Past Medical History:  Past Medical History:  Diagnosis Date  . Abdominal pain, recurrent    chronic lower abdominal pain  . ADHD (attention deficit hyperactivity disorder)   . Anxiety   . BPH (benign prostatic hyperplasia)   . Chronic pain   . Depression   . Hypertension   . Narcotic abuse (HCC)    opiates    Past Surgical History:  Procedure Laterality Date  . APPENDECTOMY  08/2005    Family History:  Family History  Problem Relation Age of Onset  . Hypertension Father   . Prostate cancer Other   . Mental illness Maternal Aunt   . Drug abuse Maternal Aunt     Social History:  reports that he has been smoking cigarettes. He has been smoking about 0.00 packs per day. He has never used smokeless tobacco. He reports that he drinks alcohol. He reports that he has current or past drug history. Drugs: Oxycodone,  Opium, and Cocaine.  Additional Social History:  Alcohol / Drug Use Pain Medications: Suboxone 16mg  Prescriptions: Ritalin 20mg ; Depakote 500mg ; Seroquel 100mg  Over the Counter: None History of alcohol / drug use?: Yes Substance #1 Name of Substance 1: Marijuana 1 - Age of First Use: Teens 1 - Amount (size/oz): One joint, one blunt 1 - Frequency: Daily 1 - Duration: on-going 1 - Last Use / Amount: 09/20  CIWA:   COWS:    Allergies:  Allergies  Allergen Reactions  . Escitalopram Oxalate Hives and Swelling  . Contrast Media [Iodinated Diagnostic Agents] Nausea And Vomiting    Home Medications:  (Not in a hospital admission)  OB/GYN Status:  No LMP for male patient.  General Assessment Data Location of Assessment: [facility] Assessment Services TTS Assessment: In system Is this a Tele or Face-to-Face Assessment?: Face-to-Face Is this an Initial Assessment or a Re-assessment for this encounter?: Initial Assessment Language Other than English: No Living Arrangements: Homeless/Shelter(Pt says he is staying with friends.) What gender do you identify as?: Male Marital status: Single Pregnancy Status: No Living Arrangements: Parent Can pt return to current living arrangement?: Yes Admission Status: Voluntary Is patient capable of signing voluntary admission?: Yes Referral Source: Self/Family/Friend Insurance type: MCD  Medical Screening Exam [facility] Walk-in ONLY) Medical Exam completed: Yes(Spencer Melvenia Beam, PA)  Crisis Care Plan Living  Arrangements: Parent Name of Psychiatrist: Dr. Neila GearVincent Izediuno Name of Therapist: None  Education Status Is patient currently in school?: No Highest grade of school patient has completed: high school graduate Is the patient employed, unemployed or receiving disability?: Receiving disability income  Risk to self with the past 6 months Suicidal Ideation: No-Not Currently/Within Last 6 Months Has patient been a risk to self within the past 6  months prior to admission? : Yes Suicidal Intent: No Has patient had any suicidal intent within the past 6 months prior to admission? : No Is patient at risk for suicide?: No Suicidal Plan?: No Has patient had any suicidal plan within the past 6 months prior to admission? : Yes Specify Current Suicidal Plan: (3 weeks ago thought of hanging self or overdosing) Access to Means: Yes Specify Access to Suicidal Means: Rope and medications What has been your use of drugs/alcohol within the last 12 months?: Marijuana Previous Attempts/Gestures: No How many times?: 0 Other Self Harm Risks: None Triggers for Past Attempts: None known Intentional Self Injurious Behavior: None Family Suicide History: No Recent stressful life event(s): Turmoil (Comment)(Recent relocation) Persecutory voices/beliefs?: Yes Depression: Yes Depression Symptoms: Despondent, Loss of interest in usual pleasures Substance abuse history and/or treatment for substance abuse?: Yes Suicide prevention information given to non-admitted patients: Not applicable  Risk to Others within the past 6 months Homicidal Ideation: No Does patient have any lifetime risk of violence toward others beyond the six months prior to admission? : No Thoughts of Harm to Others: No Current Homicidal Intent: No Current Homicidal Plan: No Access to Homicidal Means: No Identified Victim: No one History of harm to others?: No Assessment of Violence: None Noted Violent Behavior Description: None reported Does patient have access to weapons?: No Criminal Charges Pending?: No Does patient have a court date: No Is patient on probation?: No  Psychosis Hallucinations: Auditory, Visual(Voices telling him to harm others; seeing people following h) Delusions: Persecutory  Mental Status Report Appearance/Hygiene: Body odor, Poor hygiene, Unremarkable Eye Contact: Good Motor Activity: Freedom of movement, Unremarkable Speech:  Logical/coherent Level of Consciousness: Alert Mood: Anxious Affect: Apprehensive Anxiety Level: Moderate Thought Processes: Coherent, Relevant Judgement: Impaired Orientation: Person, Place, Situation Obsessive Compulsive Thoughts/Behaviors: Minimal  Cognitive Functioning Concentration: Decreased Memory: Recent Impaired, Remote Intact Is patient IDD: No Insight: Fair Impulse Control: Fair Appetite: Fair Have you had any weight changes? : No Change Sleep: Decreased Total Hours of Sleep: (<4H/D) Vegetative Symptoms: None  ADLScreening [facility] Assessment Services) Patient's cognitive ability adequate to safely complete daily activities?: Yes Patient able to express need for assistance with ADLs?: Yes Independently performs ADLs?: Yes (appropriate for developmental age)  Prior Inpatient Therapy Prior Inpatient Therapy: Yes Prior Therapy Dates: 02/2018 [facility] Reason for Treatment: MDD, SI  Prior Outpatient Therapy Prior Outpatient Therapy: Yes Prior Therapy Dates: 2019 Prior Therapy Facilty/Provider(s): Dr. Neila GearVincent Izediuno Reason for Treatment: med management Does patient have an ACCT team?: No Does patient have Intensive In-House Services?  : No Does patient have [facility] services? : No Does patient have P4CC services?: No  ADL Screening (condition at time of admission) Patient's cognitive ability adequate to safely complete daily activities?: Yes Is the patient deaf or have difficulty hearing?: No Does the patient have difficulty seeing, even when wearing glasses/contacts?: No Does the patient have difficulty concentrating, remembering, or making decisions?: No Patient able to express need for assistance with ADLs?: Yes Does the patient have difficulty dressing or bathing?: No Independently performs ADLs?: Yes (appropriate  for developmental age) Does the patient have difficulty walking or climbing stairs?: No Weakness of Legs:  None Weakness of Arms/Hands: None       Abuse/Neglect Assessment (Assessment to be complete while patient is alone) Abuse/Neglect Assessment Can Be Completed: Yes Physical Abuse: Denies Verbal Abuse: Denies Sexual Abuse: Denies Exploitation of patient/patient's resources: Denies Self-Neglect: Denies     Merchant navy officer (For Healthcare) Does Patient Have a Medical Advance Directive?: No Would patient like information on creating a medical advance directive?: No - Patient declined          Disposition:  Disposition Initial Assessment Completed for this Encounter: Yes Disposition of Patient: Movement to [facility] or [facility] ED Patient refused recommended treatment: No Mode of transportation if patient is discharged?: N/A Patient referred to: Other (Comment)(Pt sent to [facility] for med clearance)  On Site Evaluation by:   Reviewed with Physician:    Alexandria Lodge 03/20/2018 1:59 AM

## 2018-03-20 NOTE — ED Triage Notes (Signed)
Pt here for nausea; pt sts seen earlier today for mental health eval; pt sts detoxing from suboxone; last use yesterday

## 2018-03-20 NOTE — ED Notes (Addendum)
Patient wanded and placed in scrubs. Belongings behind the nurses station

## 2018-03-20 NOTE — BHH Suicide Risk Assessment (Cosign Needed)
Suicide Risk Assessment  Discharge Assessment   [facility]   Principal Problem: Substance induced mood disorder [facility](HCC) Discharge Diagnoses:  Patient Active Problem List   Diagnosis Date Noted  . Substance induced mood disorder (HCC) [F19.94] 03/11/2018  . Schizophrenia (HCC) [F20.9] 03/01/2018  . Cocaine-induced mood disorder (HCC) [F14.94] 11/29/2015  . Cocaine use disorder, moderate, dependence (HCC) [F14.20] 11/05/2015  . Cannabis use disorder, moderate, dependence (HCC) [F12.20] 11/05/2015  . Attention deficit hyperactivity disorder (ADHD) [F90.9] 11/05/2015  . Opioid use disorder, moderate, in early remission, on maintenance therapy (HCC) [F11.21] 11/05/2015  . Substance or medication-induced depressive disorder with onset during withdrawal [facility](HCC) [Z61.096[F19.239, F19.24, F32.89] 11/05/2015  . MDD (major depressive disorder), recurrent, severe, with psychosis (HCC) [F33.3] 11/05/2015    Total Time spent with patient: 30 minutes  Musculoskeletal: Strength & Muscle Tone: within normal limits Gait & Station: normal Patient leans: N/A  Psychiatric Specialty Exam:   Blood pressure 111/75, pulse 91, temperature 97.8 F (36.6 C), temperature source Oral, resp. rate 16, SpO2 98 %.There is no height or weight on file to calculate BMI.  General Appearance: Casual  Eye Contact::  Good  Speech:  Clear and Coherent and Normal Rate409  Volume:  Normal  Mood:  Anxious and Depressed  Affect:  Congruent and Depressed  Thought Process:  Coherent, Goal Directed and Linear  Orientation:  Full (Time, Place, and Person)  Thought Content:  Logical  Suicidal Thoughts:  No  Homicidal Thoughts:  No  Memory:  Immediate;   Good Recent;   Fair Remote;   Fair  Judgement:  Good  Insight:  Shallow  Psychomotor Activity:  Increased  Concentration:  Fair  Recall:  Fair  Fund of Knowledge:Good  Language: Good  Akathisia:  No  Handed:  Right  AIMS (if indicated):     Assets:   Psychologist, counsellingCommunication Skills Financial Resources/Insurance  Sleep:     Cognition: WNL  ADL's:  Intact   Mental Status Per Nursing Assessment::   On Admission:    Pt was seen and chart reviewed with treatment team and Dr Jannifer FranklinAkintayo.  Pt denies suicidal/homicidal ideation, denies auditory/visual hallucinations and does not appear to be responding to internal stimuli. Pt stated he has been using drugs but is planning on going [facility]. Pt's UDS positive for amphetamines, cocaine, THC, BAL negative. Pt is seen by Dr Maggie SchwalbeIzzy for psychiatry. Pt is psychiatrically and stable for discharge.   Demographic Factors:  Male, Caucasian and Unemployed  Loss Factors: Financial problems/change in socioeconomic status  Historical Factors: Impulsivity  Risk Reduction Factors:   Sense of responsibility to family  Continued Clinical Symptoms:  Depression:   Impulsivity Alcohol/Substance Abuse/Dependencies  Cognitive Features That Contribute To Risk:  Closed-mindedness    Suicide Risk:  Minimal: No identifiable suicidal ideation.  Patients presenting with no risk factors but with morbid ruminations; may be classified as minimal risk based on the severity of the depressive symptoms    Plan Of Care/Follow-up recommendations:  Activity:  as tolerated Diet:  Heart Healthy  Laveda AbbeLaurie Britton Brennyn Ortlieb, NP 03/20/2018, 12:25 PM

## 2018-03-20 NOTE — ED Triage Notes (Signed)
Patient presents with suicidal ideation that "comes and goes" and auditory hallucinations. Patient states that the voices don't instruct him to do anything. Patient without a specific plan.

## 2018-03-20 NOTE — ED Provider Notes (Signed)
[facility]    CSN: [id number] Arrival date & time: 03/20/18  1646     History   Chief Complaint Chief Complaint  Patient presents with  . Nausea    HPI [name] is a 42 y.o. male.   42 year old male presents with nausea and 5 episodes of vomiting that occurred today.  Patient states that he is detoxing from Suboxone last used March 19, 2018.  Per Per epic records [facility]  Patient denies any fevers, upper respiratory illness or diarrhea.  Patient states he correlates his symptoms to his detox.     Past Medical History:  Diagnosis Date  . Abdominal pain, recurrent    chronic lower abdominal pain  . ADHD (attention deficit hyperactivity disorder)   . Anxiety   . BPH (benign prostatic hyperplasia)   . Chronic pain   . Depression   . Hypertension   . Narcotic abuse (HCC)    opiates    Patient Active Problem List   Diagnosis Date Noted  . Substance induced mood disorder (HCC) 03/11/2018  . Schizophrenia (HCC) 03/01/2018  . Cocaine-induced mood disorder (HCC) 11/29/2015  . Cocaine use disorder, moderate, dependence (HCC) 11/05/2015  . Cannabis use disorder, moderate, dependence (HCC) 11/05/2015  . Attention deficit hyperactivity disorder (ADHD) 11/05/2015  . Opioid use disorder, moderate, in early remission, on maintenance therapy (HCC) 11/05/2015  . 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 MDD (major depressive disorder), recurrent, severe, with psychosis (HCC) 11/05/2015    Past Surgical History:  Procedure Laterality Date  . APPENDECTOMY  08/2005       Home Medications    Prior to Admission medications   Medication Sig Start Date End Date Taking? Authorizing Provider  albuterol (PROVENTIL HFA;VENTOLIN HFA) 108 (90 Base) MCG/ACT inhaler Inhale 1-2 puffs into the lungs every 6 (six) hours as needed for wheezing or shortness of  breath. 03/02/18   Armandina StammerNwoko, Agnes I, NP  buprenorphine-naloxone (SUBOXONE) 8-2 mg SUBL SL tablet Place 1 tablet under the tongue 3 (three) times daily.    [provider]  divalproex (DEPAKOTE) 500 MG DR tablet Take 500 mg by mouth 2 (two) times daily.     [provider]  FLUoxetine (PROZAC) 40 MG capsule Take 40 mg by mouth daily.    [provider]  fluticasone (FLOVENT HFA) 110 MCG/ACT inhaler Inhale 2 puffs into the lungs 2 (two) times daily. For allergies 03/02/18   Armandina StammerNwoko, Agnes I, NP  gabapentin (NEURONTIN) 800 MG tablet Take 800 mg by mouth daily.    [provider]  methylphenidate (RITALIN) 20 MG tablet Take 20 mg by mouth daily.    [provider]  QUEtiapine (SEROQUEL) 100 MG tablet Take 100 mg by mouth at bedtime.    [provider]    Family History Family History  Problem Relation Age of Onset  . Hypertension Father   . Prostate cancer Other   . Mental illness Maternal Aunt   . Drug abuse Maternal Aunt     Social History Social History   Tobacco Use  . Smoking status: Current Every Day Smoker    Packs/day: 0.00    Types: Cigarettes  . Smokeless tobacco: Never Used  Substance Use Topics  . Alcohol use: Yes  . Drug use: Yes    Types: Oxycodone, Opium, Cocaine    Comment: Heroin, "pain pills"     Allergies   Escitalopram  oxalate and Contrast media [iodinated diagnostic agents]   Review of Systems Review of Systems  Constitutional: Negative for chills and fever.  HENT: Negative for ear pain and sore throat.   Eyes: Negative for pain and visual disturbance.  Respiratory: Negative for cough and shortness of breath.   Cardiovascular: Negative for chest pain and palpitations.  Gastrointestinal: Positive for nausea and vomiting. Negative for abdominal pain and diarrhea.  Genitourinary: Negative for dysuria and hematuria.  Musculoskeletal: Negative for arthralgias and back pain.  Skin: Negative for color change and  rash.  Neurological: Negative for seizures and syncope.  All other systems reviewed and are negative.    Physical Exam Triage Vital Signs ED Triage Vitals  Enc Vitals Group     BP 03/20/18 1738 114/64     Pulse Rate 03/20/18 1738 85     Resp 03/20/18 1738 18     Temp 03/20/18 1738 (!) 97.5 F (36.4 C)     Temp Source 03/20/18 1738 Oral     SpO2 03/20/18 1738 99 %     Weight --      Height --      Head Circumference --      Peak Flow --      Pain Score 03/20/18 1739 0     Pain Loc --      Pain Edu? --      Excl. in GC? --    No data found.  Updated Vital Signs BP 114/64 (BP Location: Right Arm)   Pulse 85   Temp (!) 97.5 F (36.4 C) (Oral)   Resp 18   SpO2 99%   Visual Acuity Right Eye Distance:   Left Eye Distance:   Bilateral Distance:    Right Eye Near:   Left Eye Near:    Bilateral Near:     Physical Exam  Constitutional: He is oriented to person, place, and time. He appears well-developed and well-nourished.  HENT:  Head: Normocephalic.  Neck: Normal range of motion.  Cardiovascular: Normal rate and regular rhythm.  Pulmonary/Chest: Effort normal and breath sounds normal.  Musculoskeletal: Normal range of motion.  Neurological: He is alert and oriented to person, place, and time.  Skin: Skin is dry.  Psychiatric: He has a normal mood and affect.  Nursing note and vitals reviewed.    UC Treatments / Results  Labs (all labs ordered are listed, but only abnormal results are displayed) Labs Reviewed - No data to display  EKG None  Radiology No results found.  Procedures Procedures (including critical care time)  Medications Ordered in UC Medications - No data to display  Initial Impression / Assessment and Plan / UC Course  I have reviewed the triage vital signs and the nursing notes.  Pertinent labs & imaging results that were available during my care of the patient were reviewed by me and considered in my medical decision making (see  chart for details).      Final Clinical Impressions(s) / UC Diagnoses   Final diagnoses:  None   Discharge Instructions   None    ED Prescriptions    None     Controlled Substance Prescriptions [place] Controlled Substance Registry consulted? Not Applicable   Alene Mires, NP 03/20/18 1812

## 2018-03-20 NOTE — ED Provider Notes (Signed)
Signout from previous provider, Jodi GeraldsKelsey Ford, PA-C, at shift change See previous providers note for full H&P  Briefly, patient presenting for evaluation of hallucinations.  Patient reports auditory hallucinations but they are not commanding voices.  He has had waxing and waning suicidal thoughts that have worsened today.  He denies HI.  Patient evaluated by TTS and disposition the patient to be discharged home with outpatient follow-up.   Emi HolesLaw, Azure Budnick M, PA-C 03/20/18 1227    Bethann BerkshireZammit, Joseph, MD 03/20/18 [phone]

## 2018-03-20 NOTE — ED Notes (Addendum)
Pt left prior to discharge assessment, and summary. RN made aware by [employer].

## 2018-03-29 ENCOUNTER — Emergency Department (HOSPITAL_COMMUNITY)
Admission: EM | Admit: 2018-03-29 | Discharge: 2018-03-29 | Disposition: A | Discharge status: Home / Self Care | Payer: Medicaid Other | Attending: Emergency Medicine | Admitting: Emergency Medicine

## 2018-03-29 ENCOUNTER — Other Ambulatory Visit: Payer: Self-pay

## 2018-03-29 ENCOUNTER — Encounter (HOSPITAL_COMMUNITY): Payer: Self-pay | Admitting: Emergency Medicine

## 2018-03-29 DIAGNOSIS — F1420 Cocaine dependence, uncomplicated: Secondary | ICD-10-CM | POA: Diagnosis not present

## 2018-03-29 DIAGNOSIS — F17210 Nicotine dependence, cigarettes, uncomplicated: Secondary | ICD-10-CM | POA: Diagnosis not present

## 2018-03-29 DIAGNOSIS — F419 Anxiety disorder, unspecified: Secondary | ICD-10-CM | POA: Insufficient documentation

## 2018-03-29 DIAGNOSIS — F1121 Opioid dependence, in remission: Secondary | ICD-10-CM | POA: Insufficient documentation

## 2018-03-29 DIAGNOSIS — Z765 Malingerer [conscious simulation]: Secondary | ICD-10-CM | POA: Diagnosis not present

## 2018-03-29 DIAGNOSIS — I10 Essential (primary) hypertension: Secondary | ICD-10-CM | POA: Insufficient documentation

## 2018-03-29 DIAGNOSIS — F329 Major depressive disorder, single episode, unspecified: Secondary | ICD-10-CM | POA: Diagnosis not present

## 2018-03-29 DIAGNOSIS — F209 Schizophrenia, unspecified: Secondary | ICD-10-CM | POA: Diagnosis not present

## 2018-03-29 DIAGNOSIS — Z79899 Other long term (current) drug therapy: Secondary | ICD-10-CM | POA: Insufficient documentation

## 2018-03-29 DIAGNOSIS — F909 Attention-deficit hyperactivity disorder, unspecified type: Secondary | ICD-10-CM | POA: Diagnosis not present

## 2018-03-29 DIAGNOSIS — F1220 Cannabis dependence, uncomplicated: Secondary | ICD-10-CM | POA: Diagnosis not present

## 2018-03-29 DIAGNOSIS — F99 Mental disorder, not otherwise specified: Secondary | ICD-10-CM | POA: Diagnosis present

## 2018-03-29 LAB — CBC WITH DIFFERENTIAL/PLATELET
BASOS ABS: 0 10*3/uL (ref 0.0–0.1)
Basophils Relative: 1 %
Eosinophils Absolute: 0 10*3/uL (ref 0.0–0.7)
Eosinophils Relative: 1 %
HEMATOCRIT: 42.7 % (ref 39.0–52.0)
Hemoglobin: 14.1 g/dL (ref 13.0–17.0)
LYMPHS ABS: 0.9 10*3/uL (ref 0.7–4.0)
LYMPHS PCT: 29 %
MCH: 31.2 pg (ref 26.0–34.0)
MCHC: 33 g/dL (ref 30.0–36.0)
MCV: 94.5 fL (ref 78.0–100.0)
MONO ABS: 0.4 10*3/uL (ref 0.1–1.0)
Monocytes Relative: 12 %
NEUTROS ABS: 1.9 10*3/uL (ref 1.7–7.7)
Neutrophils Relative %: 57 %
Platelets: 170 10*3/uL (ref 150–400)
RBC: 4.52 MIL/uL (ref 4.22–5.81)
RDW: 13.1 % (ref 11.5–15.5)
WBC: 3.3 10*3/uL — AB (ref 4.0–10.5)

## 2018-03-29 LAB — COMPREHENSIVE METABOLIC PANEL
ALT: 30 U/L (ref 0–44)
AST: 28 U/L (ref 15–41)
Albumin: 4 g/dL (ref 3.5–5.0)
Alkaline Phosphatase: 48 U/L (ref 38–126)
Anion gap: 10 (ref 5–15)
BUN: 9 mg/dL (ref 6–20)
CO2: 28 mmol/L (ref 22–32)
Calcium: 8.9 mg/dL (ref 8.9–10.3)
Chloride: 102 mmol/L (ref 98–111)
Creatinine, Ser: 0.8 mg/dL (ref 0.61–1.24)
GFR calc Af Amer: >60 mL/min (ref >60)
GFR calc non Af Amer: >60 mL/min (ref >60)
Glucose, Bld: 96 mg/dL (ref 70–99)
POTASSIUM: 3.3 mmol/L — AB (ref 3.5–5.1)
SODIUM: 140 mmol/L (ref 135–145)
Total Bilirubin: 0.5 mg/dL (ref 0.3–1.2)
Total Protein: 6.8 g/dL (ref 6.5–8.1)

## 2018-03-29 LAB — ACETAMINOPHEN LEVEL: Acetaminophen (Tylenol), Serum: <10 ug/mL — ABNORMAL LOW (ref 10–30)

## 2018-03-29 LAB — SALICYLATE LEVEL: Salicylate Lvl: <7 mg/dL (ref 2.8–30.0)

## 2018-03-29 LAB — ETHANOL

## 2018-03-29 NOTE — ED Triage Notes (Signed)
Pt here with SI with plan to OD on meth. [facility] is from jail to be evaluated due to him taking half a gram of meth today.

## 2018-03-29 NOTE — Discharge Instructions (Signed)
No abnormalities seen on your physical exam today.

## 2018-03-29 NOTE — ED Notes (Signed)
Bed: WLPT4 Expected date:  Expected time:  Means of arrival:  Comments: 

## 2018-03-29 NOTE — ED Provider Notes (Signed)
[facility]-EMERGENCY DEPT Provider Note  CSN: [id number] Arrival date & time: 03/29/18  1249    History   Chief Complaint Chief Complaint  Patient presents with  . Suicidal  . Drug Problem   HPI [name] is a 42 y.o. male with a medical history of HTN and BPH and psychiatric history of schizophrenia, ADHD and polysubstance use who presented to the ED with law enforcement for suicidal ideation. Patient states that his SI occurs in the context of numerous arrest warrants and the police coming to arrest him today. He states that he "felt like he had nothing to live for" so he did a "big shot" of meth in attempts of OD'ing. Patient reports having 0.5g of IV meth 2 hours prior to ED arrival. At first, patient stated that he did not know where he was found down, but later stated that he called his mother who came to a grocery store where he was with the police. Patient's order of events change throughout the interview and endorsing/denying AVH, but remains consistent in saying that his SI is driven by his arrest warrants.    Patient has no specific physical complaints. Denies chest pain, diaphoresis, SOB, paresthesias or weakness.  Past Medical History:  Diagnosis Date  . Abdominal pain, recurrent    chronic lower abdominal pain  . ADHD (attention deficit hyperactivity disorder)   . Anxiety   . BPH (benign prostatic hyperplasia)   . Chronic pain   . Depression   . Hypertension   . Narcotic abuse (HCC)    opiates    Patient Active Problem List   Diagnosis Date Noted  . Substance induced mood disorder (HCC) 03/11/2018  . Schizophrenia (HCC) 03/01/2018  . Cocaine-induced mood disorder (HCC) 11/29/2015  . Cocaine use disorder, moderate, dependence (HCC) 11/05/2015  . Cannabis use disorder, moderate, dependence (HCC) 11/05/2015  . Attention deficit hyperactivity disorder (ADHD) 11/05/2015  . Opioid use disorder, moderate, in early remission, on maintenance  therapy (HCC) 11/05/2015  . 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 MDD (major depressive disorder), recurrent, severe, with psychosis (HCC) 11/05/2015    Past Surgical History:  Procedure Laterality Date  . APPENDECTOMY  08/2005        Home Medications    Prior to Admission medications   Medication Sig Start Date End Date Taking? Authorizing Provider  albuterol (PROVENTIL HFA;VENTOLIN HFA) 108 (90 Base) MCG/ACT inhaler Inhale 1-2 puffs into the lungs every 6 (six) hours as needed for wheezing or shortness of breath. 03/02/18   Armandina Stammer I, NP  buprenorphine-naloxone (SUBOXONE) 8-2 mg SUBL SL tablet Place 1 tablet under the tongue 3 (three) times daily.    [provider]  divalproex (DEPAKOTE) 500 MG DR tablet Take 500 mg by mouth 2 (two) times daily.     [provider]  FLUoxetine (PROZAC) 40 MG capsule Take 40 mg by mouth daily.    [provider]  fluticasone (FLOVENT HFA) 110 MCG/ACT inhaler Inhale 2 puffs into the lungs 2 (two) times daily. For allergies 03/02/18   Armandina Stammer I, NP  gabapentin (NEURONTIN) 800 MG tablet Take 800 mg by mouth daily.    [provider]  methylphenidate (RITALIN) 20 MG tablet Take 20 mg by mouth daily.    [provider]  ondansetron (ZOFRAN) 4 MG tablet Take 1 tablet (4 mg total) by mouth every 6 (six) hours. 03/20/18   Alene Mires, NP  QUEtiapine (  SEROQUEL) 100 MG tablet Take 100 mg by mouth at bedtime.    [provider]    Family History Family History  Problem Relation Age of Onset  . Hypertension Father   . Prostate cancer Other   . Mental illness Maternal Aunt   . Drug abuse Maternal Aunt     Social History Social History   Tobacco Use  . Smoking status: Current Every Day Smoker    Packs/day: 0.00    Types: Cigarettes  . Smokeless tobacco: Never Used  Substance Use Topics  . Alcohol use: Yes  . Drug use:  Yes    Types: Oxycodone, Opium, Cocaine    Comment: Heroin, "pain pills"     Allergies   Escitalopram oxalate and Contrast media [iodinated diagnostic agents]   Review of Systems Review of Systems  Constitutional: Negative.   HENT: Negative.   Eyes: Negative.   Respiratory: Negative.   Cardiovascular: Negative.   Gastrointestinal: Negative.   Musculoskeletal: Negative.   Skin: Negative.   Neurological: Negative.   Psychiatric/Behavioral: Positive for suicidal ideas. Negative for agitation, behavioral problems, confusion, decreased concentration and hallucinations. The patient is nervous/anxious.    Physical Exam Updated Vital Signs BP 115/82 (BP Location: Right Arm)   Pulse 86   Temp 98.9 F (37.2 C) (Oral)   Resp 16   SpO2 97%   Physical Exam  Constitutional: He is oriented to person, place, and time. He appears well-developed and well-nourished.  Eyes: Pupils are equal, round, and reactive to light. Conjunctivae, EOM and lids are normal.  Cardiovascular: Normal rate, regular rhythm, normal heart sounds, intact distal pulses and normal pulses.  No murmur heard. Pulmonary/Chest: Effort normal and breath sounds normal.  Abdominal: Soft. Normal appearance and bowel sounds are normal. There is no tenderness.  Musculoskeletal: Normal range of motion.  Neurological: He is alert and oriented to person, place, and time. He has normal strength. No cranial nerve deficit or sensory deficit. He exhibits normal muscle tone.  Reflex Scores:      Tricep reflexes are 2+ on the right side and 2+ on the left side.      Bicep reflexes are 2+ on the right side and 2+ on the left side.      Brachioradialis reflexes are 2+ on the right side and 2+ on the left side.      Patellar reflexes are 2+ on the right side and 2+ on the left side.      Achilles reflexes are 2+ on the right side and 2+ on the left side. Skin: Skin is warm and intact. Capillary refill takes less than 2 seconds. No  abrasion and no rash noted. No erythema.  Psychiatric: His speech is normal and behavior is normal. Thought content normal. His mood appears anxious. He is not actively hallucinating. Cognition and memory are normal.  Patient is calm, pleasant and cooperative. He maintains appropriate eye contact throughout the whole interview. Does not appear internally stimulated and does not exhibit thought blocking. Endorses passive SI and AVH. Denies HI. He is attentive.  Nursing note and vitals reviewed.    ED Treatments / Results  Labs (all labs ordered are listed, but only abnormal results are displayed) Labs Reviewed  COMPREHENSIVE METABOLIC PANEL  ETHANOL  RAPID URINE DRUG SCREEN, HOSP PERFORMED  CBC WITH DIFFERENTIAL/PLATELET  SALICYLATE LEVEL  ACETAMINOPHEN LEVEL  URINALYSIS, ROUTINE W REFLEX MICROSCOPIC    EKG None  Radiology No results found.  Procedures Procedures (including critical care time)  Medications Ordered in ED Medications - No data to display   Initial Impression / Assessment and Plan / ED Course  Triage vital signs and the nursing notes have been reviewed.  Pertinent labs & imaging results that were available during care of the patient were reviewed and considered in medical decision making (see chart for details).   Patient presents with law enforcement for SI. Patient gives conflicting information about the order of events of today. It is suspected that the patient is malingering and attempting to leverage SI in hopes of avoiding jail time. Patient talks frequently about his warrants and expresses anxiety around that. He states that he attempted to OD on 0.5g of meth prior to arrival. Based on clinical presentation, it is highly unlikely that patient had any meth prior to ED arrival and his arrest. Physical exam is normal. Normal pupils, respirations and heart rate. He is lying calmly and appropriately in the triage room. While he endorses AVH, patient is able to  maintain attention and focus throughout the interview. His behavior and speech is not consistent with hyperactivity and psychomotor agitation often associated with meth intoxication. At this time, patient does not meet criteria for IVC as he does not present as an imminent danger to himself or others. Patient will be transported to jail where there are additional psychiatric services for patient.  Patient has no specific physical complaints or abnormal physical exam findings that need further evaluation.  Final Clinical Impressions(s) / ED Diagnoses  1. Malingering.   Dispo: Custody of Patent examiner. After thorough clinical evaluation, this patient is determined to be medically stable and can be safely discharged with the previously mentioned treatment and/or outpatient follow-up/referral(s). At this time, there are no other apparent medical conditions that require further screening, evaluation or treatment.   Final diagnoses:  Malingering    ED Discharge Orders    None        Reva Bores 03/29/18 1531    Gerhard Munch, MD 03/30/18 1600

## 2020-03-03 ENCOUNTER — Emergency Department (HOSPITAL_COMMUNITY)
Admission: EM | Admit: 2020-03-03 | Discharge: 2020-03-03 | Disposition: A | Discharge status: Home / Self Care | Payer: Medicaid Other | Attending: Emergency Medicine | Admitting: Emergency Medicine

## 2020-03-03 ENCOUNTER — Encounter (HOSPITAL_COMMUNITY): Payer: Self-pay | Admitting: Emergency Medicine

## 2020-03-03 ENCOUNTER — Other Ambulatory Visit: Payer: Self-pay

## 2020-03-03 DIAGNOSIS — F1910 Other psychoactive substance abuse, uncomplicated: Secondary | ICD-10-CM | POA: Diagnosis not present

## 2020-03-03 DIAGNOSIS — F17210 Nicotine dependence, cigarettes, uncomplicated: Secondary | ICD-10-CM | POA: Insufficient documentation

## 2020-03-03 DIAGNOSIS — Z79899 Other long term (current) drug therapy: Secondary | ICD-10-CM | POA: Diagnosis not present

## 2020-03-03 DIAGNOSIS — R45851 Suicidal ideations: Secondary | ICD-10-CM | POA: Diagnosis not present

## 2020-03-03 DIAGNOSIS — I10 Essential (primary) hypertension: Secondary | ICD-10-CM | POA: Diagnosis not present

## 2020-03-03 DIAGNOSIS — Z20822 Contact with and (suspected) exposure to covid-19: Secondary | ICD-10-CM | POA: Diagnosis not present

## 2020-03-03 LAB — COMPREHENSIVE METABOLIC PANEL
ALT: 23 U/L (ref 0–44)
AST: 27 U/L (ref 15–41)
Albumin: 4.3 g/dL (ref 3.5–5.0)
Alkaline Phosphatase: 49 U/L (ref 38–126)
Anion gap: 12 (ref 5–15)
BUN: 11 mg/dL (ref 6–20)
CO2: 26 mmol/L (ref 22–32)
Calcium: 9 mg/dL (ref 8.9–10.3)
Chloride: 99 mmol/L (ref 98–111)
Creatinine, Ser: 1.11 mg/dL (ref 0.61–1.24)
GFR calc Af Amer: >60 mL/min (ref >60)
GFR calc non Af Amer: >60 mL/min (ref >60)
Glucose, Bld: 113 mg/dL — ABNORMAL HIGH (ref 70–99)
Potassium: 3.6 mmol/L (ref 3.5–5.1)
Sodium: 137 mmol/L (ref 135–145)
Total Bilirubin: 1.2 mg/dL (ref 0.3–1.2)
Total Protein: 7 g/dL (ref 6.5–8.1)

## 2020-03-03 LAB — RAPID URINE DRUG SCREEN, HOSP PERFORMED
Amphetamines: POSITIVE — AB
Barbiturates: NOT DETECTED
Benzodiazepines: NOT DETECTED
Cocaine: POSITIVE — AB
Opiates: POSITIVE — AB
Tetrahydrocannabinol: POSITIVE — AB

## 2020-03-03 LAB — CBC
HCT: 42.6 % (ref 39.0–52.0)
Hemoglobin: 13.9 g/dL (ref 13.0–17.0)
MCH: 31.2 pg (ref 26.0–34.0)
MCHC: 32.6 g/dL (ref 30.0–36.0)
MCV: 95.5 fL (ref 80.0–100.0)
Platelets: 244 10*3/uL (ref 150–400)
RBC: 4.46 MIL/uL (ref 4.22–5.81)
RDW: 12.8 % (ref 11.5–15.5)
WBC: 9.9 10*3/uL (ref 4.0–10.5)
nRBC: 0 % (ref 0.0–0.2)

## 2020-03-03 LAB — SALICYLATE LEVEL: Salicylate Lvl: <7 mg/dL — ABNORMAL LOW (ref 7.0–30.0)

## 2020-03-03 LAB — ACETAMINOPHEN LEVEL: Acetaminophen (Tylenol), Serum: <10 ug/mL — ABNORMAL LOW (ref 10–30)

## 2020-03-03 LAB — SARS CORONAVIRUS 2 BY RT PCR (HOSPITAL ORDER, PERFORMED IN ~~LOC~~ HOSPITAL LAB): SARS Coronavirus 2: NEGATIVE

## 2020-03-03 LAB — ETHANOL: Alcohol, Ethyl (B): <10 mg/dL (ref <10)

## 2020-03-03 NOTE — Discharge Instructions (Addendum)
There is information for substance abuse programs in the paperwork. It is important that you follow up and stop using drugs.  Return to the ER with any new, worsening, or concerning symptoms.

## 2020-03-03 NOTE — Consult Note (Addendum)
Patient seen and evaluated in person.  Not vested in rehab to detox/recover from substance use.  Does not want Rehab as recommended, offered and he declined.  He is fixated on going to a hospital despite not meeting criteria as he only has intermittent, passive suicidal ideations which are noted in his chart to be triggered when he has warrants out.  Release from prison on Sunday, 8/29, sent to prison in 11/19.  Now, he has two felony warrants and is in need to return to jail.  EDP released prior to this note.  Nanine Means, PMHNP Patient seen face-to-face for psychiatric evaluation, chart reviewed and case discussed with the physician extender and developed treatment plan. Reviewed the information documented and agree with the treatment plan. Thedore Mins, MD

## 2020-03-03 NOTE — ED Triage Notes (Signed)
Patient here after taking crystal meth.  Patient states that he got out of jail on Sunday and overdosed on heroin on Monday.  States he has used over $2000 in the last few days and is now SI.  He states he will continue to use until he hurts himself, which is his plan.

## 2020-03-03 NOTE — ED Provider Notes (Signed)
[name]   CSN: [id number] Arrival date & time: 03/03/20  2992     History Chief Complaint  Patient presents with   Medical Clearance    [name] is a 44 y.o. male presenting for suicidal ideations and requesting detox.  Patient states he is requesting detox from meth.  He also reports cocaine and heroin use.  Patient states he is having suicidal thoughts.  He is attempting to kill himself by using drugs.  He denies HI or AVH.  He denies chest pain, shortness breath, nausea, vomiting abdominal pain, urinary symptoms, normal bowel movements.  He reports no other medical problems, takes medications daily.  Additional history obtained from chart review.  Patient has been seen previously for similar complaints.  He was recently released from jail per triage French.  HPI     Past Medical History:  Diagnosis Date   Abdominal pain, recurrent    chronic lower abdominal pain   ADHD (attention deficit hyperactivity disorder)    Anxiety    BPH (benign prostatic hyperplasia)    Chronic pain    Depression    Hypertension    Narcotic abuse (HCC)    opiates    Patient Active Problem List   Diagnosis Date Noted   Substance induced mood disorder (HCC) 03/11/2018   Schizophrenia (HCC) 03/01/2018   Cocaine-induced mood disorder (HCC) 11/29/2015   Cocaine use disorder, moderate, dependence (HCC) 11/05/2015   Cannabis use disorder, moderate, dependence (HCC) 11/05/2015   Attention deficit hyperactivity disorder (ADHD) 11/05/2015   Opioid use disorder, moderate, in early remission, on maintenance therapy (HCC) 11/05/2015   Substance or medication-induced depressive disorder with onset during withdrawal (HCC) 11/05/2015   MDD (major depressive disorder), recurrent, severe, with psychosis (HCC) 11/05/2015    Past Surgical History:  Procedure Laterality Date   APPENDECTOMY  08/2005       Family History    Problem Relation Age of Onset   Hypertension Father    Prostate cancer Other    Mental illness Maternal Aunt    Drug abuse Maternal Aunt     Social History   Tobacco Use   Smoking status: Current Every Day Smoker    Packs/day: 0.00    Types: Cigarettes   Smokeless tobacco: Never Used  Substance Use Topics   Alcohol use: Yes   Drug use: Yes    Types: Oxycodone, Opium, Cocaine    Comment: Heroin, "pain pills"    Home Medications Prior to Admission medications   Medication Sig Start Date End Date Taking? Authorizing Provider  albuterol (PROVENTIL HFA;VENTOLIN HFA) 108 (90 Base) MCG/ACT inhaler Inhale 1-2 puffs into the lungs every 6 (six) hours as needed for wheezing or shortness of breath. 03/02/18   Armandina Stammer I, NP  buprenorphine-naloxone (SUBOXONE) 8-2 mg SUBL SL tablet Place 1 tablet under the tongue 3 (three) times daily.    [provider]  divalproex (DEPAKOTE) 500 MG DR tablet Take 500 mg by mouth 2 (two) times daily.     [provider]  FLUoxetine (PROZAC) 40 MG capsule Take 40 mg by mouth daily.    [provider]  fluticasone (FLOVENT HFA) 110 MCG/ACT inhaler Inhale 2 puffs into the lungs 2 (two) times daily. For allergies 03/02/18   Armandina Stammer I, NP  gabapentin (NEURONTIN) 800 MG tablet Take 800 mg by mouth daily.    [provider]  methylphenidate (RITALIN) 20 MG tablet Take 20 mg by mouth daily.  [provider]  ondansetron (ZOFRAN) 4 MG tablet Take 1 tablet (4 mg total) by mouth every 6 (six) hours. 03/20/18   Alene Mires, NP  QUEtiapine (SEROQUEL) 100 MG tablet Take 100 mg by mouth at bedtime.    [provider]    Allergies    Escitalopram oxalate and Contrast media [iodinated diagnostic agents]  Review of Systems   Review of Systems  Psychiatric/Behavioral: Positive for self-injury and suicidal ideas.  All other systems reviewed and are negative.   Physical Exam Updated Vital  Signs BP 90/62 (BP Location: Right Arm)    Pulse 85    Temp 98.1 F (36.7 C) (Oral)    Resp 14    SpO2 97%   Physical Exam Vitals and nursing French reviewed.  Constitutional:      General: He is not in acute distress.    Appearance: He is well-developed.     Comments: Appears nontoxic  HENT:     Head: Normocephalic and atraumatic.  Eyes:     Conjunctiva/sclera: Conjunctivae normal.     Pupils: Pupils are equal, round, and reactive to light.  Cardiovascular:     Rate and Rhythm: Normal rate and regular rhythm.     Pulses: Normal pulses.  Pulmonary:     Effort: Pulmonary effort is normal. No respiratory distress.     Breath sounds: Normal breath sounds. No wheezing.  Abdominal:     General: There is no distension.     Palpations: Abdomen is soft. There is no mass.     Tenderness: There is no abdominal tenderness. There is no rebound.  Musculoskeletal:        General: Normal range of motion.     Cervical back: Normal range of motion and neck supple.  Skin:    General: Skin is warm and dry.     Capillary Refill: Capillary refill takes less than 2 seconds.  Neurological:     Mental Status: He is alert and oriented to person, place, and time.  Psychiatric:        Attention and Perception: He does not perceive auditory or visual hallucinations.        Thought Content: Thought content includes suicidal ideation. Thought content does not include homicidal ideation. Thought content includes suicidal plan. Thought content does not include homicidal plan.     ED Results / Procedures / Treatments   Labs (all labs ordered are listed, but only abnormal results are displayed) Labs Reviewed  COMPREHENSIVE METABOLIC PANEL - Abnormal; Notable for the following components:      Result Value   Glucose, Bld 113 (*)    All other components within normal limits  SALICYLATE LEVEL - Abnormal; Notable for the following components:   Salicylate Lvl <7.0 (*)    All other components within normal  limits  ACETAMINOPHEN LEVEL - Abnormal; Notable for the following components:   Acetaminophen (Tylenol), Serum <10 (*)    All other components within normal limits  RAPID URINE DRUG SCREEN, HOSP PERFORMED - Abnormal; Notable for the following components:   Opiates POSITIVE (*)    Cocaine POSITIVE (*)    Amphetamines POSITIVE (*)    Tetrahydrocannabinol POSITIVE (*)    All other components within normal limits  SARS CORONAVIRUS 2 BY RT PCR (HOSPITAL ORDER, PERFORMED IN [facility])  ETHANOL  CBC    EKG None  Radiology No results found.  Procedures Procedures (including critical care time)  Medications Ordered in ED Medications - No  data to display  ED Course  I have reviewed the triage vital signs and the nursing notes.  Pertinent labs & imaging results that were available during my care of the patient were reviewed by me and considered in my medical decision making (see chart for details).    MDM Rules/Calculators/A&P                          Patient presenting for SI and requesting detox.  On exam, patient appears nontoxic.  He has been seen previously for similar, although 1 to 2 years ago.  He was recently released from jail, consider a component of malingering versus homelessness.  However as he reports active SI, will consult with TTS.  Medical screening labs obtained from triage overall reassuring.  Patient is medically cleared at this time.  Of French, his UDS is positive for opioids, cocaine, amphetamines, and marijuana.  Patient requesting to leave.  Patient states he was evaluated by the behavioral health team, they did not feel he needed inpatient criteria but offered overnight observation and reassessment in the morning.  Per chart review, patient's SI and polysubstance abuse appears long-term, and there is concern for malingering/secondary gain.  As such, I feel comfortable with patient leaving, I do not believe he needs to be IVC at this time.  At this  time, patient appears safe for discharge.  Return precautions given.  Patient given resources for inpatient outpatient substance abuse programs.  Patient states he understands.  Final Clinical Impression(s) / ED Diagnoses Final diagnoses:  Polysubstance abuse (HCC)  Suicidal ideation    Rx / DC Orders ED Discharge Orders    None       Alveria Apley, PA-C 03/03/20 1118    Rolan Bucco, MD 03/03/20 1143

## 2020-03-05 ENCOUNTER — Other Ambulatory Visit: Payer: Self-pay

## 2020-03-05 ENCOUNTER — Encounter (HOSPITAL_COMMUNITY): Payer: Self-pay

## 2020-03-05 ENCOUNTER — Emergency Department (HOSPITAL_COMMUNITY)
Admission: EM | Admit: 2020-03-05 | Discharge: 2020-03-06 | Disposition: A | Discharge status: Home / Self Care | Payer: Medicaid Other | Attending: Emergency Medicine | Admitting: Emergency Medicine

## 2020-03-05 DIAGNOSIS — Z7951 Long term (current) use of inhaled steroids: Secondary | ICD-10-CM | POA: Diagnosis not present

## 2020-03-05 DIAGNOSIS — F17210 Nicotine dependence, cigarettes, uncomplicated: Secondary | ICD-10-CM | POA: Diagnosis not present

## 2020-03-05 DIAGNOSIS — F909 Attention-deficit hyperactivity disorder, unspecified type: Secondary | ICD-10-CM | POA: Insufficient documentation

## 2020-03-05 DIAGNOSIS — I10 Essential (primary) hypertension: Secondary | ICD-10-CM | POA: Diagnosis not present

## 2020-03-05 DIAGNOSIS — F1910 Other psychoactive substance abuse, uncomplicated: Secondary | ICD-10-CM | POA: Diagnosis not present

## 2020-03-05 DIAGNOSIS — F1290 Cannabis use, unspecified, uncomplicated: Secondary | ICD-10-CM | POA: Diagnosis not present

## 2020-03-05 DIAGNOSIS — F1120 Opioid dependence, uncomplicated: Secondary | ICD-10-CM | POA: Diagnosis present

## 2020-03-05 LAB — COMPREHENSIVE METABOLIC PANEL
ALT: 41 U/L (ref 0–44)
AST: 85 U/L — ABNORMAL HIGH (ref 15–41)
Albumin: 4.4 g/dL (ref 3.5–5.0)
Alkaline Phosphatase: 52 U/L (ref 38–126)
Anion gap: 13 (ref 5–15)
BUN: 14 mg/dL (ref 6–20)
CO2: 24 mmol/L (ref 22–32)
Calcium: 9 mg/dL (ref 8.9–10.3)
Chloride: 98 mmol/L (ref 98–111)
Creatinine, Ser: 1.19 mg/dL (ref 0.61–1.24)
GFR calc Af Amer: >60 mL/min (ref >60)
GFR calc non Af Amer: >60 mL/min (ref >60)
Glucose, Bld: 112 mg/dL — ABNORMAL HIGH (ref 70–99)
Potassium: 2.7 mmol/L — CL (ref 3.5–5.1)
Sodium: 135 mmol/L (ref 135–145)
Total Bilirubin: 1.4 mg/dL — ABNORMAL HIGH (ref 0.3–1.2)
Total Protein: 7.2 g/dL (ref 6.5–8.1)

## 2020-03-05 LAB — CBC
HCT: 43.2 % (ref 39.0–52.0)
Hemoglobin: 14.3 g/dL (ref 13.0–17.0)
MCH: 30.9 pg (ref 26.0–34.0)
MCHC: 33.1 g/dL (ref 30.0–36.0)
MCV: 93.3 fL (ref 80.0–100.0)
Platelets: 296 10*3/uL (ref 150–400)
RBC: 4.63 MIL/uL (ref 4.22–5.81)
RDW: 12.6 % (ref 11.5–15.5)
WBC: 15.7 10*3/uL — ABNORMAL HIGH (ref 4.0–10.5)
nRBC: 0 % (ref 0.0–0.2)

## 2020-03-05 LAB — RAPID URINE DRUG SCREEN, HOSP PERFORMED
Amphetamines: POSITIVE — AB
Barbiturates: NOT DETECTED
Benzodiazepines: NOT DETECTED
Cocaine: POSITIVE — AB
Opiates: NOT DETECTED
Tetrahydrocannabinol: POSITIVE — AB

## 2020-03-05 LAB — ETHANOL: Alcohol, Ethyl (B): <10 mg/dL (ref <10)

## 2020-03-05 MED ORDER — ONDANSETRON 4 MG PO TBDP
4.0000 mg | ORAL_TABLET | Freq: Once | ORAL | Status: AC
  Administered 2020-03-05: 4 mg via ORAL
  Filled 2020-03-05: qty 1

## 2020-03-05 NOTE — ED Triage Notes (Signed)
Pt reports that he was help with his drug problem, reports using crystal meth, crack, mariajuana today and last used alcohol last night. Pt also want to get back on his psych meds, denies SI/HI

## 2020-03-06 MED ORDER — LORAZEPAM 1 MG PO TABS
1.0000 mg | ORAL_TABLET | Freq: Once | ORAL | Status: AC
  Administered 2020-03-06: 1 mg via ORAL
  Filled 2020-03-06: qty 1

## 2020-03-06 MED ORDER — POTASSIUM CHLORIDE CRYS ER 20 MEQ PO TBCR
80.0000 meq | EXTENDED_RELEASE_TABLET | Freq: Once | ORAL | Status: AC
  Administered 2020-03-06: 80 meq via ORAL
  Filled 2020-03-06: qty 4

## 2020-03-06 MED ORDER — CLONIDINE HCL 0.1 MG PO TABS
0.1000 mg | ORAL_TABLET | Freq: Once | ORAL | Status: AC
  Administered 2020-03-06: 0.1 mg via ORAL
  Filled 2020-03-06: qty 1

## 2020-03-06 NOTE — Discharge Planning (Signed)
RNCM met with pt at bedside.  Answered all questions pertaining to treatment available (suboxone) and transportation.  RNCM provided cheese and coke as requested for snack.

## 2020-03-06 NOTE — ED Provider Notes (Signed)
[facility] EMERGENCY DEPARTMENT Provider Note   CSN: [id number] Arrival date & time: 03/05/20  2127     History Chief Complaint  Patient presents with  . Drug Overdose    [name] is a 44 y.o. male.  HPI     Patient with a history of polysubstance abuse presents with request for assistance with addiction. Last use of heroin was within the past 24 hours, and he notes that his been using multiple substances, including cocaine, alcohol, heroin, recently. He does have prior treatment stays, but none recently. He has no physical complaints, though he notes he is feeling increasingly anxious, unsettled, due to his drug use, concern for withdrawal.  No suicidal ideation - in contrast to an evaluation three days ago.  Patient is homeless, has no phone.  He notes that he spent approximately $2000 in the past few days on illicit substances.   Past Medical History:  Diagnosis Date  . Abdominal pain, recurrent    chronic lower abdominal pain  . ADHD (attention deficit hyperactivity disorder)   . Anxiety   . BPH (benign prostatic hyperplasia)   . Chronic pain   . Depression   . Hypertension   . Narcotic abuse (HCC)    opiates    Patient Active Problem List   Diagnosis Date Noted  . Substance induced mood disorder (HCC) 03/11/2018  . Schizophrenia (HCC) 03/01/2018  . Cocaine-induced mood disorder (HCC) 11/29/2015  . Cocaine use disorder, moderate, dependence (HCC) 11/05/2015  . Cannabis use disorder, moderate, dependence (HCC) 11/05/2015  . Attention deficit hyperactivity disorder (ADHD) 11/05/2015  . Opioid use disorder, moderate, in early remission, on maintenance therapy (HCC) 11/05/2015  . 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 MDD (major depressive disorder), recurrent, severe, with psychosis (HCC) 11/05/2015    Past Surgical History:  Procedure Laterality Date  . APPENDECTOMY   08/2005       Family History  Problem Relation Age of Onset  . Hypertension Father   . Prostate cancer Other   . Mental illness Maternal Aunt   . Drug abuse Maternal Aunt     Social History   Tobacco Use  . Smoking status: Current Every Day Smoker    Packs/day: 0.00    Types: Cigarettes  . Smokeless tobacco: Never Used  Substance Use Topics  . Alcohol use: Yes  . Drug use: Yes    Types: Oxycodone, Opium, Cocaine, Methamphetamines, Marijuana    Comment: Heroin, "pain pills"    Home Medications Prior to Admission medications   Medication Sig Start Date End Date Taking? Authorizing Provider  albuterol (PROVENTIL HFA;VENTOLIN HFA) 108 (90 Base) MCG/ACT inhaler Inhale 1-2 puffs into the lungs every 6 (six) hours as needed for wheezing or shortness of breath. Patient not taking: Reported on 03/06/2020 03/02/18   Armandina Stammer I, NP  fluticasone (FLOVENT HFA) 110 MCG/ACT inhaler Inhale 2 puffs into the lungs 2 (two) times daily. For allergies Patient not taking: Reported on 03/06/2020 03/02/18   Armandina Stammer I, NP  ondansetron (ZOFRAN) 4 MG tablet Take 1 tablet (4 mg total) by mouth every 6 (six) hours. Patient not taking: Reported on 03/06/2020 03/20/18   Alene Mires, NP    Allergies    Escitalopram oxalate and Contrast media [iodinated diagnostic agents]  Review of Systems   Review of Systems  Constitutional:       Per HPI, otherwise negative  HENT:  Per HPI, otherwise negative  Respiratory:       Per HPI, otherwise negative  Cardiovascular:       Per HPI, otherwise negative  Gastrointestinal: Negative for vomiting.  Endocrine:       Negative aside from HPI  Genitourinary:       Neg aside from HPI   Musculoskeletal:       Per HPI, otherwise negative  Skin: Negative.   Neurological: Negative for syncope.  Psychiatric/Behavioral: Positive for dysphoric mood.    Physical Exam Updated Vital Signs BP 115/63 (BP Location: Left Arm)   Pulse 90   Temp 99.5 F  (37.5 C) (Oral)   Resp 18   SpO2 97%   Physical Exam Vitals and nursing note reviewed.  Constitutional:      General: He is not in acute distress.    Appearance: He is well-developed.  HENT:     Head: Normocephalic and atraumatic.  Eyes:     Conjunctiva/sclera: Conjunctivae normal.  Cardiovascular:     Rate and Rhythm: Normal rate and regular rhythm.  Pulmonary:     Effort: Pulmonary effort is normal. No respiratory distress.     Breath sounds: No stridor.  Abdominal:     General: There is no distension.  Skin:    General: Skin is warm and dry.  Neurological:     Mental Status: He is alert and oriented to person, place, and time.  Psychiatric:        Mood and Affect: Mood is anxious.     ED Results / Procedures / Treatments   Labs (all labs ordered are listed, but only abnormal results are displayed) Labs Reviewed  COMPREHENSIVE METABOLIC PANEL - Abnormal; Notable for the following components:      Result Value   Potassium 2.7 (*)    Glucose, Bld 112 (*)    AST 85 (*)    Total Bilirubin 1.4 (*)    All other components within normal limits  CBC - Abnormal; Notable for the following components:   WBC 15.7 (*)    All other components within normal limits  RAPID URINE DRUG SCREEN, HOSP PERFORMED - Abnormal; Notable for the following components:   Cocaine POSITIVE (*)    Amphetamines POSITIVE (*)    Tetrahydrocannabinol POSITIVE (*)    All other components within normal limits  ETHANOL    EKG None  Radiology No results found.  Procedures Procedures (including critical care time)  Medications Ordered in ED Medications  ondansetron (ZOFRAN-ODT) disintegrating tablet 4 mg (4 mg Oral Given 03/05/20 2143)  cloNIDine (CATAPRES) tablet 0.1 mg (0.1 mg Oral Given 03/06/20 0919)  LORazepam (ATIVAN) tablet 1 mg (1 mg Oral Given 03/06/20 0919)  potassium chloride SA (KLOR-CON) CR tablet 80 mEq (80 mEq Oral Given 03/06/20 0919)    ED Course  I have reviewed the triage  vital signs and the nursing notes.  Pertinent labs & imaging results that were available during my care of the patient were reviewed by me and considered in my medical decision making (see chart for details).   Patient in no distress, ambulatory.  I discussed this case with our social work Acupuncturist.  Patient has received potassium repletion.  His other labs are unremarkable aside from e/o polysubstance abuse.   2:44 PM Patient transfering to inpatient rehab.    MDM Rules/Calculators/A&P  This adult M w Hx of polysubstance abuse p/w concern for impending withdrawal / addiction.  He was just seen for SI, but denies any  current similar sentiments.  He has no physical complaints but does require K repletion, which was performed.  I discussed his case w SW largely due to the patients homeless status and lack of a phone to assist w arranging polysubstance abuse counseling.  Patient was accepted to a local rehabilitation center, and absent other physical exam findings, hemodynamic instability, was medically appropriate for discharge and going to that facility.  Final Clinical Impression(s) / ED Diagnoses Final diagnoses:  Polysubstance abuse (HCC)     Gerhard Munch, MD 03/06/20 1451

## 2020-03-06 NOTE — Progress Notes (Signed)
CSW arranged for transportation to [facility] in [place] for substance detox. The address is [address] [place], [place]. Patient to be transported via [employer].  Rider waiver signed and completed.  Bettey Mare, RN aware of discharge plan.  Edwin Dada, MSW, LCSW-A Transitions of Care  Clinical Social Worker  [facility] Emergency Departments  Medical ICU [phone]

## 2020-03-06 NOTE — Patient Outreach (Signed)
CPSS spoke with [name] an referred Pt to [facility] services in [place]. CPSS was made aware that there are treatment beds available.

## 2020-03-26 ENCOUNTER — Emergency Department (HOSPITAL_COMMUNITY)
Admission: EM | Admit: 2020-03-26 | Discharge: 2020-03-26 | Disposition: A | Discharge status: Home / Self Care | Payer: Medicaid Other | Attending: Emergency Medicine | Admitting: Emergency Medicine

## 2020-03-26 ENCOUNTER — Ambulatory Visit (HOSPITAL_COMMUNITY)
Admission: EM | Admit: 2020-03-26 | Discharge: 2020-03-27 | Disposition: A | Discharge status: Home / Self Care | Payer: Medicaid Other | Source: Home / Self Care

## 2020-03-26 ENCOUNTER — Other Ambulatory Visit: Payer: Self-pay

## 2020-03-26 ENCOUNTER — Encounter (HOSPITAL_COMMUNITY): Payer: Self-pay | Admitting: Emergency Medicine

## 2020-03-26 DIAGNOSIS — F1120 Opioid dependence, uncomplicated: Secondary | ICD-10-CM

## 2020-03-26 DIAGNOSIS — F1920 Other psychoactive substance dependence, uncomplicated: Secondary | ICD-10-CM | POA: Diagnosis not present

## 2020-03-26 DIAGNOSIS — I10 Essential (primary) hypertension: Secondary | ICD-10-CM | POA: Insufficient documentation

## 2020-03-26 DIAGNOSIS — F332 Major depressive disorder, recurrent severe without psychotic features: Secondary | ICD-10-CM | POA: Insufficient documentation

## 2020-03-26 DIAGNOSIS — R45851 Suicidal ideations: Secondary | ICD-10-CM | POA: Insufficient documentation

## 2020-03-26 DIAGNOSIS — F17210 Nicotine dependence, cigarettes, uncomplicated: Secondary | ICD-10-CM | POA: Insufficient documentation

## 2020-03-26 DIAGNOSIS — Z20822 Contact with and (suspected) exposure to covid-19: Secondary | ICD-10-CM | POA: Insufficient documentation

## 2020-03-26 DIAGNOSIS — F1113 Opioid abuse with withdrawal: Secondary | ICD-10-CM | POA: Insufficient documentation

## 2020-03-26 DIAGNOSIS — F1910 Other psychoactive substance abuse, uncomplicated: Secondary | ICD-10-CM

## 2020-03-26 DIAGNOSIS — F909 Attention-deficit hyperactivity disorder, unspecified type: Secondary | ICD-10-CM | POA: Insufficient documentation

## 2020-03-26 LAB — COMPREHENSIVE METABOLIC PANEL
ALT: 21 U/L (ref 0–44)
AST: 26 U/L (ref 15–41)
Albumin: 4.1 g/dL (ref 3.5–5.0)
Alkaline Phosphatase: 45 U/L (ref 38–126)
Anion gap: 13 (ref 5–15)
BUN: 8 mg/dL (ref 6–20)
CO2: 25 mmol/L (ref 22–32)
Calcium: 9.1 mg/dL (ref 8.9–10.3)
Chloride: 100 mmol/L (ref 98–111)
Creatinine, Ser: 0.98 mg/dL (ref 0.61–1.24)
GFR calc Af Amer: >60 mL/min (ref >60)
GFR calc non Af Amer: >60 mL/min (ref >60)
Glucose, Bld: 87 mg/dL (ref 70–99)
Potassium: 3.4 mmol/L — ABNORMAL LOW (ref 3.5–5.1)
Sodium: 138 mmol/L (ref 135–145)
Total Bilirubin: 1 mg/dL (ref 0.3–1.2)
Total Protein: 6.8 g/dL (ref 6.5–8.1)

## 2020-03-26 LAB — ETHANOL: Alcohol, Ethyl (B): <10 mg/dL (ref <10)

## 2020-03-26 LAB — RAPID URINE DRUG SCREEN, HOSP PERFORMED
Amphetamines: POSITIVE — AB
Barbiturates: NOT DETECTED
Benzodiazepines: POSITIVE — AB
Cocaine: NOT DETECTED
Opiates: NOT DETECTED
Tetrahydrocannabinol: POSITIVE — AB

## 2020-03-26 LAB — CBC
HCT: 42.8 % (ref 39.0–52.0)
Hemoglobin: 13.7 g/dL (ref 13.0–17.0)
MCH: 30.7 pg (ref 26.0–34.0)
MCHC: 32 g/dL (ref 30.0–36.0)
MCV: 96 fL (ref 80.0–100.0)
Platelets: 345 10*3/uL (ref 150–400)
RBC: 4.46 MIL/uL (ref 4.22–5.81)
RDW: 12.7 % (ref 11.5–15.5)
WBC: 11.3 10*3/uL — ABNORMAL HIGH (ref 4.0–10.5)
nRBC: 0 % (ref 0.0–0.2)

## 2020-03-26 LAB — RESPIRATORY PANEL BY RT PCR (FLU A&B, COVID)
Influenza A by PCR: NEGATIVE
Influenza B by PCR: NEGATIVE
SARS Coronavirus 2 by RT PCR: NEGATIVE

## 2020-03-26 LAB — SALICYLATE LEVEL: Salicylate Lvl: <7 mg/dL — ABNORMAL LOW (ref 7.0–30.0)

## 2020-03-26 LAB — ACETAMINOPHEN LEVEL: Acetaminophen (Tylenol), Serum: <10 ug/mL — ABNORMAL LOW (ref 10–30)

## 2020-03-26 MED ORDER — NAPROXEN 500 MG PO TABS: 500.0000 mg | ORAL_TABLET | Freq: Two times a day (BID) | ORAL | Status: DC | PRN

## 2020-03-26 MED ORDER — DICYCLOMINE HCL 20 MG PO TABS: 20.0000 mg | ORAL_TABLET | Freq: Four times a day (QID) | ORAL | Status: DC | PRN

## 2020-03-26 MED ORDER — CLONIDINE HCL 0.1 MG PO TABS
0.1000 mg | ORAL_TABLET | Freq: Four times a day (QID) | ORAL | Status: DC
  Administered 2020-03-26 – 2020-03-27 (×2): 0.1 mg via ORAL
  Filled 2020-03-26 (×2): qty 1

## 2020-03-26 MED ORDER — CLONIDINE HCL 0.1 MG PO TABS: 0.1000 mg | ORAL_TABLET | ORAL | Status: DC

## 2020-03-26 MED ORDER — ONDANSETRON 4 MG PO TBDP: 4.0000 mg | ORAL_TABLET | Freq: Four times a day (QID) | ORAL | Status: DC | PRN

## 2020-03-26 MED ORDER — CLONIDINE HCL 0.1 MG PO TABS: 0.1000 mg | ORAL_TABLET | Freq: Every day | ORAL | Status: DC

## 2020-03-26 MED ORDER — METHOCARBAMOL 500 MG PO TABS: 500.0000 mg | ORAL_TABLET | Freq: Three times a day (TID) | ORAL | Status: DC | PRN

## 2020-03-26 MED ORDER — LOPERAMIDE HCL 2 MG PO CAPS: 2.0000 mg | ORAL_CAPSULE | ORAL | Status: DC | PRN

## 2020-03-26 MED ORDER — LORAZEPAM 1 MG PO TABS
1.0000 mg | ORAL_TABLET | Freq: Once | ORAL | Status: AC
  Administered 2020-03-26: 1 mg via ORAL
  Filled 2020-03-26: qty 1

## 2020-03-26 MED ORDER — HYDROXYZINE HCL 25 MG PO TABS: 25.0000 mg | ORAL_TABLET | Freq: Four times a day (QID) | ORAL | Status: DC | PRN

## 2020-03-26 NOTE — ED Notes (Signed)
Lunch tray given. Belongings inventoried

## 2020-03-26 NOTE — ED Triage Notes (Signed)
Pt here from home requesting detox from heroin ,which he snorts last use yesterday , pt also sates that he is having some SI thought s, hx of same

## 2020-03-26 NOTE — ED Provider Notes (Addendum)
[facility] EMERGENCY DEPARTMENT Provider Note   CSN: [id number] Arrival date & time: 03/26/20  0930     History No chief complaint on file.   [name] is a 44 y.o. male with PMH significant for polysubstance abuse and depression who presents the ED for SI with specific plan and heroin withdrawal.  I reviewed patient's medical record he has been evaluated twice in the past month for similar complaints.  He reported at the time that he was homeless and spends all of his money on illicit drugs.  Consult with social work was obtained and he was ultimately discharged into the care of of a local rehabilitation center at [facility] in [place] for substance detoxification.  On my examination, patient states that he was admitted at [facility] in [place], [place] for 7 days but afterwards was discharged to the shelters.  He states that he has been homeless for a couple of years.  After discharge from [facility], he once again relapsed and was using up to 1 g of heroin each day.  He also endorses intermittently using cocaine and marijuana.  He denies any alcohol use.  He denies any HI, but does endorse severe "mood swings".  He also endorses anhedonia and states that nothing is being here to anymore.  He states that he is "tired of it all".  He is currently endorsing mild itching, fidgety feeling, and nausea that he attributes to his withdrawal symptoms.  He is asking for treatment.  He understands that this is not a place for detoxification.  Patient has a history of AVH, but none recently.  His depression has been worsening significantly over the course of the past 3 days.  He denies any chest pain, headache or dizziness, shortness of breath, cough, fevers or chills, IVDA, or other symptoms.  HPI     Past Medical History:  Diagnosis Date  . Abdominal pain, recurrent    chronic lower abdominal pain  . ADHD (attention deficit hyperactivity disorder)   . Anxiety   . BPH (benign  prostatic hyperplasia)   . Chronic pain   . Depression   . Hypertension   . Narcotic abuse (HCC)    opiates    Patient Active Problem List   Diagnosis Date Noted  . Substance induced mood disorder (HCC) 03/11/2018  . Schizophrenia (HCC) 03/01/2018  . Cocaine-induced mood disorder (HCC) 11/29/2015  . Cocaine use disorder, moderate, dependence (HCC) 11/05/2015  . Cannabis use disorder, moderate, dependence (HCC) 11/05/2015  . Attention deficit hyperactivity disorder (ADHD) 11/05/2015  . Opioid use disorder, moderate, in early remission, on maintenance therapy (HCC) 11/05/2015  . 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 MDD (major depressive disorder), recurrent, severe, with psychosis (HCC) 11/05/2015    Past Surgical History:  Procedure Laterality Date  . APPENDECTOMY  08/2005       Family History  Problem Relation Age of Onset  . Hypertension Father   . Prostate cancer Other   . Mental illness Maternal Aunt   . Drug abuse Maternal Aunt     Social History   Tobacco Use  . Smoking status: Current Every Day Smoker    Packs/day: 0.00    Types: Cigarettes  . Smokeless tobacco: Never Used  Substance Use Topics  . Alcohol use: Yes  . Drug use: Yes    Types: Oxycodone, Opium, Cocaine, Methamphetamines, Marijuana    Comment: Heroin, "pain pills"    Home Medications Prior to Admission  medications   Medication Sig Start Date End Date Taking? Authorizing Provider  albuterol (PROVENTIL HFA;VENTOLIN HFA) 108 (90 Base) MCG/ACT inhaler Inhale 1-2 puffs into the lungs every 6 (six) hours as needed for wheezing or shortness of breath. Patient not taking: Reported on 03/06/2020 03/02/18   Armandina Stammer I, NP  fluticasone (FLOVENT HFA) 110 MCG/ACT inhaler Inhale 2 puffs into the lungs 2 (two) times daily. For allergies Patient not taking: Reported on 03/06/2020 03/02/18   Armandina Stammer I, NP  ondansetron (ZOFRAN) 4 MG tablet Take  1 tablet (4 mg total) by mouth every 6 (six) hours. Patient not taking: Reported on 03/06/2020 03/20/18   Alene Mires, NP    Allergies    Escitalopram oxalate and Contrast media [iodinated diagnostic agents]  Review of Systems   Review of Systems  All other systems reviewed and are negative.   Physical Exam Updated Vital Signs BP 120/76 (BP Location: Right Arm)   Pulse 89   Temp 98.7 F (37.1 C) (Oral)   Resp 16   Ht 5\' 8"  (1.727 m)   Wt 74.8 kg   SpO2 99%   BMI 25.09 kg/m   Physical Exam Vitals and nursing note reviewed. Exam conducted with a chaperone present.  Constitutional:      Comments: Fidgety, scratching his arms.  HENT:     Head: Normocephalic and atraumatic.  Eyes:     General: No scleral icterus.    Extraocular Movements: Extraocular movements intact.     Conjunctiva/sclera: Conjunctivae normal.     Pupils: Pupils are equal, round, and reactive to light.  Cardiovascular:     Rate and Rhythm: Normal rate and regular rhythm.     Pulses: Normal pulses.     Heart sounds: Normal heart sounds.  Pulmonary:     Effort: Pulmonary effort is normal. No respiratory distress.     Breath sounds: Normal breath sounds.  Abdominal:     General: Abdomen is flat. There is no distension.     Palpations: Abdomen is soft.     Tenderness: There is no abdominal tenderness.  Musculoskeletal:        General: Normal range of motion.     Cervical back: Normal range of motion and neck supple. No rigidity.  Skin:    General: Skin is dry.     Capillary Refill: Capillary refill takes less than 2 seconds.  Neurological:     Mental Status: He is alert.     GCS: GCS eye subscore is 4. GCS verbal subscore is 5. GCS motor subscore is 6.  Psychiatric:        Behavior: Behavior normal.     Comments: Anxious.  Answering questions appropriately.  Depressed.     ED Results / Procedures / Treatments   Labs (all labs ordered are listed, but only abnormal results are  displayed) Labs Reviewed  COMPREHENSIVE METABOLIC PANEL - Abnormal; Notable for the following components:      Result Value   Potassium 3.4 (*)    All other components within normal limits  SALICYLATE LEVEL - Abnormal; Notable for the following components:   Salicylate Lvl <7.0 (*)    All other components within normal limits  ACETAMINOPHEN LEVEL - Abnormal; Notable for the following components:   Acetaminophen (Tylenol), Serum <10 (*)    All other components within normal limits  CBC - Abnormal; Notable for the following components:   WBC 11.3 (*)    All other components within normal limits  RESPIRATORY  PANEL BY RT PCR (FLU A&B, COVID)  ETHANOL  RAPID URINE DRUG SCREEN, HOSP PERFORMED    EKG None  Radiology No results found.  Procedures Procedures (including critical care time)  Medications Ordered in ED Medications  LORazepam (ATIVAN) tablet 1 mg (has no administration in time range)    ED Course  I have reviewed the triage vital signs and the nursing notes.  Pertinent labs & imaging results that were available during my care of the patient were reviewed by me and considered in my medical decision making (see chart for details).  Clinical Course as of Mar 26 1754  Mon Mar 26, 2020  1755 I spoke with Assunta Found who will accepting provider at [facility].     [GG]    Clinical Course User Index [GG] Lorelee New, PA-C   MDM Rules/Calculators/A&P                          Patient is here in the ED for suicidal ideation with a specific plan.  He plans to overdose on heroin, which he snorts on a daily basis.  He states that he has been using up to 1 g a day.  He spends any money that he gets on his addiction.  He is "tired of it all" and is seeking treatment.  He is voluntary here in the ED.  He is concerned given his suicidal ideation.  While he is also requesting treatment for his withdrawal symptoms, his primary motivator for coming to the ED today was his worsening  depression and SI.  Medical clearance labs personally reviewed and largely unremarkable.  Mild hypokalemia to 3.4, but much improved when compared to labs obtained previously.  Mild leukocytosis to 11.3, also improved when compared to prior labs.  Likely context of substance use.  He denies any symptoms or history concerning for infection.  Vital signs stable and WNL.  Medically cleared.  We will consult TTS.  Will provide patient with 1 mg Ativan given his nausea and withdrawal symptoms.  Lower suspicion for drug seeking behavior as he states that he can readily get illicit drugs on street just by "knowing the right people".  Awaiting their evaluation to determine disposition.    I spoke with Shuvon Rankin who will accepting provider at [facility].     Final Clinical Impression(s) / ED Diagnoses Final diagnoses:  Suicidal ideation  Severe episode of recurrent major depressive disorder, without psychotic features (HCC)  Polysubstance (including opioids) dependence, daily use [facility])    Rx / DC Orders ED Discharge Orders    None       Lorelee New, PA-C 03/26/20 1417    Margarita Grizzle, MD 03/26/20 1458    Lorelee New, PA-C 03/26/20 1755    Margarita Grizzle, MD 03/28/20 1702

## 2020-03-26 NOTE — ED Notes (Signed)
ED Provider at bedside. 

## 2020-03-26 NOTE — ED Notes (Signed)
Patient given meal; sandwich, chips, drink

## 2020-03-26 NOTE — ED Notes (Signed)
Pt A&O x 4, taking shower at present, remains SI, monitoring for safety, no distress noted, calm & cooperative.

## 2020-03-26 NOTE — ED Notes (Signed)
Patient belongings in locker # 13. 

## 2020-03-26 NOTE — BH Assessment (Addendum)
Comprehensive Clinical Assessment (CCA) Note  03/26/2020 [name] [id number]  Visit Diagnosis:   Substance induced mood disorder ; Polysubstance (including opioids) dependence, daily use  Disposition: Hillery Jacks, NP recommends admission to [facility] Continuous Obs bed.  [name] is a 44 yo single male who presents voluntarily to [facility] reporting polysubstance abuse, depression, SI with plan to overdose, and heroin withdrawal sx.   Pt denies current medications, stating "I'm supposed to be on Suboxone but I'm not". He reports he was most recently inpt at [facility] in [place] for detox x 7 days about 2 weeks ago.   Pt acknowledges multiple symptoms of Depression, including anhedonia, isolating, feelings of worthlessness and hopelessness, tearfulness, reduced sleep & appetite (recent 5-6 lb wt loss), & increased irritability. Pt denies homicidal ideation/ history of violence. Pt denies auditory & visual hallucinations & other symptoms of psychosis. Pt states current stressors include physical sx of withdrawal, depression dx and homelessness (has been staying in drug houses).   Pt denies hx of abuse and trauma. Pt reports he lost his Disability benefits when he went to jail. Pt was agreeable to authorizing collateral contact with his [facility] but could only remember his 1st name was Sherilyn Cooter. Pt has partial insight and judgment. Pt's memory is intact.  ? MSE: Pt is casually dressed, alert, oriented x 5 with normal speech and normal motor behavior. Eye contact is fair. Pt's mood is depressed and affect is depressed and irritable. Affect is congruent with mood. Thought process is coherent and relevant. There is no indication pt is currently responding to internal stimuli or experiencing delusional thought content. Pt was cooperative throughout assessment.      ICD-10-CM   1. Suicidal ideation  R45.851   2. Severe episode of recurrent major depressive disorder, without  psychotic features (HCC)  F33.2   3. Polysubstance (including opioids) dependence, daily use (HCC)  F11.20    F19.20      CCA Screening, Triage and Referral (STR)  Patient Reported Information How did you hear about [place]? Self  Referral name: self  Whom do you see for routine medical problems? Hospital ER;I don't have a doctor  What Is the Reason for Your Visit/Call Today? mainly depression and bipolar  How Long Has This Been Causing You Problems? 1-6 months  What Do You Feel Would Help You the Most Today? Other (Comment);Therapy;Medication (inpt for my mental health and substance abuse)   Have You Recently Been in Any Inpatient Treatment (Hospital/Detox/Crisis Center/28-Day Program)? Yes  Name/Location of Program/Hospital:[facility] -detox  How Long Were You There? 7 days  When Were You Discharged? 03/12/20 (about the 13th of September)   Have You Ever Received Services From [facility] Before? Yes  Have You Recently Had Any Thoughts About Hurting Yourself? Yes  Are You Planning to Commit Suicide/Harm Yourself At This time? Yes (plan is to overdose on anything I can get my hands on)   Have you Recently Had Thoughts About Hurting Someone [name]? No  Have You Used Any Alcohol or Drugs in the Past 24 Hours? Yes  How Long Ago Did You Use Drugs or Alcohol? 2300  What Did You Use and How Much? heroin "a bunch"   Do You Currently Have a Therapist/Psychiatrist? No   Have You Been Recently Discharged From Any Office Practice or Programs? Yes  Explanation of Discharge From Practice/Program: [facility] less than 2 weeks ago     CCA Screening Triage Referral Assessment Type of Contact: Tele-Assessment  Is  this Initial or Reassessment? Initial Assessment  Date Telepsych consult ordered in [facility]:  03/26/20  Time Telepsych consult ordered in [facility]:  1244   Collateral Involvement: parole officer; Sherilyn Cooter, (pt trying to remember his last name) in [place]  Is CPS involved  or ever been involved? Never  Is APS involved or ever been involved? Never   Patient Determined To Be At Risk for Harm To Self or Others Based on Review of Patient Reported Information or Presenting Complaint? Yes, for Self-Harm   Location of Assessment: [facility] ED   Does Patient Present under Involuntary Commitment? No  [place] of Residence: [place]   Patient Currently Receiving the Following Services: Not Receiving Services   Determination of Need: Emergent (2 hours)   Options For Referral: Outpatient Therapy;Medication Management;Inpatient Hospitalization   CCA Biopsychosocial  Intake/Chief Complaint:  CCA Intake With Chief Complaint CCA Part Two Date: 03/25/20 CCA Part Two Time: 1432 Chief Complaint/Presenting Problem: polysubstance abuse and depression who presents the ED for SI with plan to overdose, and heroin withdrawal. Patient's Currently Reported Symptoms/Problems: opioid abuse & reported w/d sx; depression with SI Type of Services Patient Feels Are Needed: "inpt tx for my mental health"  Mental Health Symptoms Depression:  Depression: Change in energy/activity, Difficulty Concentrating, Fatigue, Hopelessness, Increase/decrease in appetite, Irritability, Sleep (too much or little), Tearfulness, Weight gain/loss, Worthlessness, Duration of symptoms greater than two weeks  Mania:  Mania: None  Anxiety:   Anxiety: Sleep, Worrying, Tension, Restlessness, Irritability, Fatigue, Difficulty concentrating  Psychosis:  Psychosis: None  Trauma:  Trauma: None  Obsessions:  Obsessions: None  Compulsions:  Compulsions: None  Inattention:  Inattention: N/A  Hyperactivity/Impulsivity:  Hyperactivity/Impulsivity: N/A  Oppositional/Defiant Behaviors:  Oppositional/Defiant Behaviors: N/A  Emotional Irregularity:  Emotional Irregularity: Recurrent suicidal behaviors/gestures/threats, Chronic feelings of emptiness, Intense/inappropriate anger, Potentially harmful impulsivity  Other  Mood/Personality Symptoms:      Mental Status Exam Appearance and self-care  Stature:  Stature: Average  Weight:  Weight: Average weight  Clothing:  Clothing: Casual  Grooming:  Grooming: Normal  Cosmetic use:  Cosmetic Use: None  Posture/gait:  Posture/Gait: Slumped, Normal  Motor activity:  Motor Activity: Not Remarkable, Restless  Sensorium  Attention:  Attention: Normal  Concentration:  Concentration: Normal  Orientation:  Orientation: X5  Recall/memory:  Recall/Memory: Normal  Affect and Mood  Affect:  Affect: Constricted  Mood:  Mood: Irritable  Relating  Eye contact:  Eye Contact: Normal, Avoided  Facial expression:  Facial Expression: Tense, Constricted  Attitude toward examiner:  Attitude Toward Examiner: Irritable, Uninterested  Thought and Language  Speech flow: Speech Flow: Clear and Coherent  Thought content:  Thought Content: Appropriate to Mood and Circumstances  Preoccupation:  Preoccupations: None  Hallucinations:  Hallucinations: None  Organization:     Company secretary of Knowledge:  Fund of Knowledge: Good  Intelligence:  Intelligence: Average  Abstraction:  Abstraction: Normal  Judgement:  Judgement: Fair  Dance movement psychotherapist:  Reality Testing: Realistic  Insight:  Insight: Fair  Decision Making:  Decision Making: Impulsive, Vacilates  Social Functioning  Social Maturity:  Social Maturity: Self-centered  Social Judgement:  Social Judgement: "Chief of Staff", Normal  Stress  Stressors:  Stressors: Housing, Doctor, hospital Ability:  Coping Ability: Deficient supports, Engineer, agricultural Deficits:  Skill Deficits: Self-control  Supports:  Supports: Support needed     Religion: Religion/Spirituality Are You A Religious Person?: No  Leisure/Recreation: Leisure / Recreation Do You Have Hobbies?: Yes Leisure and Hobbies: golf  Exercise/Diet: Exercise/Diet Have You Gained  or Lost A Significant Amount of Weight in the Past Six Months?:  Yes-Lost Number of Pounds Lost?: 5 Do You Have Any Trouble Sleeping?: Yes Explanation of Sleeping Difficulties: 2-3 hours a day   CCA Employment/Education  Employment/Work Situation: Employment / Work Situation Employment situation: Unemployed What is the longest time patient has a held a job?: "couple years" 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 Has patient ever been in the [employer]?: No  Education: Education Is Patient Currently Attending School?: No Did Garment/textile technologist From [employer]?: Yes Did You Have Any Difficulty At [employer]?: Yes Were Any Medications Ever Prescribed For These Difficulties?: Yes Medications Prescribed For School Difficulties?: ADHD meds   CCA Family/Childhood History  Family and Relationship History: Family history Marital status: Single Does patient have children?: No  Childhood History:  Childhood History By whom was/is the patient raised?: Both parents Did patient suffer any verbal/emotional/physical/sexual abuse as a child?: No Did patient suffer from severe childhood neglect?: No Has patient ever been sexually abused/assaulted/raped as an adolescent or adult?: No Witnessed domestic violence?: No   CCA Substance Use  Alcohol/Drug Use: Alcohol / Drug Use Pain Medications: "supposed to be taking suboxone but not at the moment" Prescriptions: none currently Over the Counter: denies History of alcohol / drug use?: Yes Longest period of sobriety (when/how long): couple years Negative Consequences of Use: Financial, Legal, Personal relationships Withdrawal Symptoms: Diarrhea, Sweats, Tingling, Weakness, Irritability, Tremors, Fever / Chills, Cramps, Agitation Substance #1 Name of Substance 1: heroin 1 - Age of First Use: 28 1 - Frequency: daily 1 - Last Use / Amount: 03/26/20 "unknown time but over 24 hrs" Substance #2 Name of Substance 2: THC 2 - Frequency: daily    DSM5 Diagnoses: Patient Active Problem List   Diagnosis Date Noted  .  Substance induced mood disorder (HCC) 03/11/2018  . Schizophrenia (HCC) 03/01/2018  . Cocaine-induced mood disorder (HCC) 11/29/2015  . Cocaine use disorder, moderate, dependence (HCC) 11/05/2015  . Cannabis use disorder, moderate, dependence (HCC) 11/05/2015  . Attention deficit hyperactivity disorder (ADHD) 11/05/2015  . Opioid use disorder, moderate, in early remission, on maintenance therapy (HCC) 11/05/2015  . 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 MDD (major depressive disorder), recurrent, severe, with psychosis (HCC) 11/05/2015    Hillery Jacks, NP recommends admission to [facility] Continuous Obs bed   [name] Suzan Nailer

## 2020-03-26 NOTE — ED Notes (Signed)
Pt admitted to continuous assessment for polysubstance abuse and SI. At current, pt irritated stating, "I thought I would be going to [facility], not here". RN explained parameters for observation. Pt verbalized understanding. Pt able to contract for safety. Will continue to monitor.

## 2020-03-26 NOTE — ED Provider Notes (Signed)
Behavioral Health Admission H&P [facility] & OBS)  Date: 03/27/20 Patient Name: [name] MRN: [id number] Chief Complaint:  Chief Complaint  Patient presents with  . Suicidal  . Depression      Diagnoses:  Final diagnoses:  Suicide ideation  Polysubstance abuse (HCC)    HPI:  [name] is a 44 year old, male with a past medical history significant for polysubstance abuse and depression who presented to [facility], after assessment at [facility] ED, with a chief complaint of suicide ideation and heroin withdrawal. When asked if he had a specific plan, the patient reported that he was going to overdose on drugs. Patient states that a combination of substance use in the form of heroin and being off his medications were what triggered his suicide ideation. Patient reports that he is experiencing withdrawal symptoms including fever, chills, piloerections, rhinorrhea, nausea, anxiousness, and cramps. Patient reports that he last used heroin was yesterday.  Patient currently endorses suicide ideation with a specific plan to overdose on drugs. Patient denies homicidal ideation. Patient further denies current auditory or visual hallucinations but patient states he has experienced hearing voices in the past. Patient states that when he does hear voices, they tell him to kill himself. Patient states that his appetite is not good but he is able to eat. Patient also reports sleep is not good either. Patient's main concern is that his PO is informed of where he currently is. Patient states his PO needs to be contacted every other day.  PHQ 2-9:     ED from 03/26/2020 in [facility] ED from 03/29/2018 [facility] (Discharged[facility]  C-SSRS RISK CATEGORY Low Risk High Risk High Risk       Total Time spent with patient: 30 minutes  Musculoskeletal   Strength & Muscle Tone: within normal limits Gait & Station: normal Patient leans: N/A  Psychiatric Specialty Exam  Presentation General Appearance: Appropriate for Environment  Eye Contact:Poor  Speech:Clear and Coherent  Speech Volume:Normal  Handedness:Right   Mood and Affect  Mood:Anxious  Affect:Congruent;Constricted   Thought Process  Thought Processes:Coherent;Goal Directed  Descriptions of Associations:Intact  Orientation:Full (Time, Place and Person)  Thought Content:Logical  Hallucinations:Hallucinations: None  Ideas of Reference:None  Suicidal Thoughts:Suicidal Thoughts: Yes, Active SI Active Intent and/or Plan: With Intent;With Plan  Homicidal Thoughts:Homicidal Thoughts: No   Sensorium  Memory:Immediate Good;Remote Good;Recent Fair  Judgment:Good  Insight:Good   Executive Functions  Concentration:Good  Attention Span:Good  Recall:Good  Fund of Knowledge:Good  Language:Good   Psychomotor Activity  Psychomotor Activity:Psychomotor Activity: Restlessness (Patient states he is currently going through withdrawal)   Assets  Assets:Communication Skills;Desire for Improvement   Sleep  Sleep:Sleep: Poor (Patient states his sleep hasn't been good.)   Physical Exam Constitutional:      Appearance: He is ill-appearing (Patient states that he is currently experiencing withdrawal symptoms).  HENT:     Head: Normocephalic and atraumatic.     Nose: Nose normal.  Eyes:     Extraocular Movements: Extraocular movements intact.     Pupils: Pupils are equal, round, and reactive to light.  Cardiovascular:     Rate and Rhythm: Normal rate and regular rhythm.  Pulmonary:     Effort: Pulmonary effort is normal.     Breath sounds: Normal breath sounds.  Abdominal:     General: Abdomen is flat.     Palpations: Abdomen is soft.  Musculoskeletal:        General: Normal range of motion.     Cervical back: Normal range of motion and neck  supple.  Skin:    General: Skin is warm and dry.  Neurological:     Mental Status: He is alert and oriented to person, place, and time.  Psychiatric:        Behavior: Behavior normal.        Thought Content: Thought content normal.    Review of Systems  Constitutional: Positive for chills and fever.  HENT:       Patient states he is experiencing rhinorrhea  Eyes: Negative.   Respiratory: Negative.   Cardiovascular: Negative.   Gastrointestinal: Positive for abdominal pain and nausea.  Musculoskeletal: Positive for myalgias.  Skin:       Patient states he is experiencing piloerections  Neurological: Negative.   Endo/Heme/Allergies: Negative.   Psychiatric/Behavioral: Positive for substance abuse and suicidal ideas. Negative for hallucinations. The patient is nervous/anxious and has insomnia.     Blood pressure 138/77, pulse 89, temperature 97.7 F (36.5 C), temperature source Temporal, resp. rate 18, SpO2 100 %. There is no height or weight on file to calculate BMI.  Past Psychiatric History: Attention Deficit Hyperactivity Disorder Anxiety Depression Narcotic Abuse  Is the patient at risk to self? Yes  Has the patient been a risk to self in the past 6 months? Yes .    Has the patient been a risk to self within the distant past? Yes   Is the patient a risk to others? No   Has the patient been a risk to others in the past 6 months? No   Has the patient been a risk to others within the distant past? No   Past Medical History:  Past Medical History:  Diagnosis Date  . Abdominal pain, recurrent    chronic lower abdominal pain  . ADHD (attention deficit hyperactivity disorder)   . Anxiety   . BPH (benign prostatic hyperplasia)   . Chronic pain   . Depression   . Hypertension   . Narcotic abuse (HCC)    opiates    Past Surgical History:  Procedure Laterality Date  . APPENDECTOMY  08/2005    Family History:  Family History  Problem Relation Age of Onset  .  Hypertension Father   . Prostate cancer Other   . Mental illness Maternal Aunt   . Drug abuse Maternal Aunt     Social History:  Social History   Socioeconomic History  . Marital status: Single    Spouse name: Not on file  . Number of children: Not on file  . Years of education: Not on file  . Highest education level: Not on file  Occupational History  . Not on file  Tobacco Use  . Smoking status: Current Every Day Smoker    Packs/day: 0.00    Types: Cigarettes  . Smokeless tobacco: Never Used  Substance and Sexual Activity  . Alcohol use: Yes  . Drug use: Yes    Types: Oxycodone, Opium, Cocaine, Methamphetamines, Marijuana    Comment: Heroin, "pain pills"  . Sexual activity: Yes  Other Topics Concern  . Not on file  Social History Narrative  . Not on file   Social Determinants of Health   Financial Resource Strain:   . Difficulty of Paying Living Expenses: Not on file  Food Insecurity:   . Worried About Programme researcher, broadcasting/film/video in the Last Year: Not on file  .  Ran Out of Food in the Last Year: Not on file  Transportation Needs:   . Lack of Transportation (Medical): Not on file  . Lack of Transportation (Non-Medical): Not on file  Physical Activity:   . Days of Exercise per Week: Not on file  . Minutes of Exercise per Session: Not on file  Stress:   . Feeling of Stress : Not on file  Social Connections:   . Frequency of Communication with Friends and Family: Not on file  . Frequency of Social Gatherings with Friends and Family: Not on file  . Attends Religious Services: Not on file  . Active Member of Clubs or Organizations: Not on file  . Attends Banker Meetings: Not on file  . Marital Status: Not on file  Intimate Partner Violence:   . Fear of Current or Ex-Partner: Not on file  . Emotionally Abused: Not on file  . Physically Abused: Not on file  . Sexually Abused: Not on file    SDOH:  SDOH Screenings   Alcohol Screen:   . Last Alcohol  Screening Score (AUDIT): Not on file  Depression (PHQ2-9):   . PHQ-2 Score: Not on file  Financial Resource Strain:   . Difficulty of Paying Living Expenses: Not on file  Food Insecurity:   . Worried About Programme researcher, broadcasting/film/video in the Last Year: Not on file  . Ran Out of Food in the Last Year: Not on file  Housing:   . Last Housing Risk Score: Not on file  Physical Activity:   . Days of Exercise per Week: Not on file  . Minutes of Exercise per Session: Not on file  Social Connections:   . Frequency of Communication with Friends and Family: Not on file  . Frequency of Social Gatherings with Friends and Family: Not on file  . Attends Religious Services: Not on file  . Active Member of Clubs or Organizations: Not on file  . Attends Banker Meetings: Not on file  . Marital Status: Not on file  Stress:   . Feeling of Stress : Not on file  Tobacco Use: High Risk  . Smoking Tobacco Use: Current Every Day Smoker  . Smokeless Tobacco Use: Never Used  Transportation Needs:   . Freight forwarder (Medical): Not on file  . Lack of Transportation (Non-Medical): Not on file    Last Labs:  Admission on 03/26/2020, Discharged on 03/26/2020  Component Date Value Ref Range Status  . Sodium 03/26/2020 138  135 - 145 mmol/L Final  . Potassium 03/26/2020 3.4* 3.5 - 5.1 mmol/L Final  . Chloride 03/26/2020 100  98 - 111 mmol/L Final  . CO2 03/26/2020 25  22 - 32 mmol/L Final  . Glucose, Bld 03/26/2020 87  70 - 99 mg/dL Final   Glucose reference range applies only to samples taken after fasting for at least 8 hours.  . BUN 03/26/2020 8  6 - 20 mg/dL Final  . Creatinine, Ser 03/26/2020 0.98  0.61 - 1.24 mg/dL Final  . Calcium 16/03/9603 9.1  8.9 - 10.3 mg/dL Final  . Total Protein 03/26/2020 6.8  6.5 - 8.1 g/dL Final  . Albumin 54/02/8118 4.1  3.5 - 5.0 g/dL Final  . AST 14/78/2956 26  15 - 41 U/L Final  . ALT 03/26/2020 21  0 - 44 U/L Final  . Alkaline Phosphatase 03/26/2020 45   38 - 126 U/L Final  . Total Bilirubin 03/26/2020 1.0  0.3 - 1.2  mg/dL Final  . GFR calc non Af Amer 03/26/2020 >60  >60 mL/min Final  . GFR calc Af Amer 03/26/2020 >60  >60 mL/min Final  . Anion gap 03/26/2020 13  5 - 15 Final   Performed at [facility] Lab, [address], [place], [place] [address]  . Alcohol, Ethyl (B) 03/26/2020 <10  <10 mg/dL Final   Comment: (NOTE) Lowest detectable limit for serum alcohol is 10 mg/dL.  For medical purposes only. Performed at [facility], [address], [place], [place] [address]   . Salicylate Lvl 03/26/2020 <7.0* 7.0 - 30.0 mg/dL Final   Performed at [facility], [address], [place], [place] [address]  . Acetaminophen (Tylenol), Serum 03/26/2020 <10* 10 - 30 ug/mL Final   Comment: (NOTE) Therapeutic concentrations vary significantly. A range of 10-30 ug/mL  may be an effective concentration for many patients. However, some  are best treated at concentrations outside of this range. Acetaminophen concentrations >150 ug/mL at 4 hours after ingestion  and >50 ug/mL at 12 hours after ingestion are often associated with  toxic reactions.  Performed at [facility], [address], [place], [place] [address]   . WBC 03/26/2020 11.3* 4.0 - 10.5 K/uL Final  . RBC 03/26/2020 4.46  4.22 - 5.81 MIL/uL Final  . Hemoglobin 03/26/2020 13.7  13.0 - 17.0 g/dL Final  . HCT 29/56/2130 42.8  39 - 52 % Final  . MCV 03/26/2020 96.0  80.0 - 100.0 fL Final  . MCH 03/26/2020 30.7  26.0 - 34.0 pg Final  . MCHC 03/26/2020 32.0  30.0 - 36.0 g/dL Final  . RDW 86/57/8469 12.7  11.5 - 15.5 % Final  . Platelets 03/26/2020 345  150 - 400 K/uL Final  . nRBC 03/26/2020 0.0  0.0 - 0.2 % Final   Performed at [facility], [address], [place], [place] [address]  . Opiates 03/26/2020 NONE DETECTED  NONE DETECTED Final  . Cocaine 03/26/2020 NONE DETECTED  NONE DETECTED Final  . Benzodiazepines 03/26/2020 POSITIVE* NONE DETECTED Final   . Amphetamines 03/26/2020 POSITIVE* NONE DETECTED Final  . Tetrahydrocannabinol 03/26/2020 POSITIVE* NONE DETECTED Final  . Barbiturates 03/26/2020 NONE DETECTED  NONE DETECTED Final   Comment: (NOTE) DRUG SCREEN FOR MEDICAL PURPOSES ONLY.  IF CONFIRMATION IS NEEDED FOR ANY PURPOSE, NOTIFY LAB WITHIN 5 DAYS.  LOWEST DETECTABLE LIMITS FOR URINE DRUG SCREEN Drug Class                     Cutoff (ng/mL) Amphetamine and metabolites    1000 Barbiturate and metabolites    200 Benzodiazepine                 200 Tricyclics and metabolites     300 Opiates and metabolites        300 Cocaine and metabolites        300 THC                            50 Performed at [facility] Lab, [address], [place], [place] [address]   . SARS Coronavirus 2 by RT PCR 03/26/2020 NEGATIVE  NEGATIVE Final   Comment: (NOTE) SARS-CoV-2 target nucleic acids are NOT DETECTED.  The SARS-CoV-2 RNA is generally detectable in upper respiratoy specimens during the acute phase of infection. The lowest concentration of SARS-CoV-2 viral copies this assay can detect is  131 copies/mL. A negative result does not preclude SARS-Cov-2 infection and should not be used as the sole basis for treatment or other patient management decisions. A negative result may occur with  improper specimen collection/handling, submission of specimen other than nasopharyngeal swab, presence of viral mutation(s) within the areas targeted by this assay, and inadequate number of viral copies (<131 copies/mL). A negative result must be combined with clinical observations, patient history, and epidemiological information. The expected result is Negative.  Fact Sheet for Patients:  https://www.moore.com/  Fact Sheet for Healthcare Providers:  https://www.young.biz/  This test is no                          t yet approved or cleared by the [place] FDA and  has been authorized for detection  and/or diagnosis of SARS-CoV-2 by FDA under an Emergency Use Authorization (EUA). This EUA will remain  in effect (meaning this test can be used) for the duration of the COVID-19 declaration under Section 564(b)(1) of the Act, 21 U.S.C. section 360bbb-3(b)(1), unless the authorization is terminated or revoked sooner.    . Influenza A by PCR 03/26/2020 NEGATIVE  NEGATIVE Final  . Influenza B by PCR 03/26/2020 NEGATIVE  NEGATIVE Final   Comment: (NOTE) The Xpert Xpress SARS-CoV-2/FLU/RSV assay is intended as an aid in  the diagnosis of influenza from Nasopharyngeal swab specimens and  should not be used as a sole basis for treatment. Nasal washings and  aspirates are unacceptable for Xpert Xpress SARS-CoV-2/FLU/RSV  testing.  Fact Sheet for Patients: https://www.moore.com/  Fact Sheet for Healthcare Providers: https://www.young.biz/  This test is not yet approved or cleared by the [place] FDA and  has been authorized for detection and/or diagnosis of SARS-CoV-2 by  FDA under an Emergency Use Authorization (EUA). This EUA will remain  in effect (meaning this test can be used) for the duration of the  Covid-19 declaration under Section 564(b)(1) of the Act, 21  U.S.C. section 360bbb-3(b)(1), unless the authorization is  terminated or revoked. Performed at [facility], [address] [place], [place] [address]   Admission on 03/05/2020, Discharged on 03/06/2020  Component Date Value Ref Range Status  . Sodium 03/05/2020 135  135 - 145 mmol/L Final  . Potassium 03/05/2020 2.7* 3.5 - 5.1 mmol/L Final   Comment: CRITICAL RESULT CALLED TO, READ BACK BY AND VERIFIED WITH: RN A  RN A OLEARY @2236  03/05/20 BY S GEZAHEGN   . Chloride 03/05/2020 98  98 - 111 mmol/L Final  . CO2 03/05/2020 24  22 - 32 mmol/L Final  . Glucose, Bld 03/05/2020 112* 70 - 99 mg/dL Final   Glucose reference range applies only to samples taken after fasting for  at least 8 hours.  . BUN 03/05/2020 14  6 - 20 mg/dL Final  . Creatinine, Ser 03/05/2020 1.19  0.61 - 1.24 mg/dL Final  . Calcium 05/05/2020 9.0  8.9 - 10.3 mg/dL Final  . Total Protein 03/05/2020 7.2  6.5 - 8.1 g/dL Final  . Albumin 05/05/2020 4.4  3.5 - 5.0 g/dL Final  . AST 95/28/4132 85* 15 - 41 U/L Final  . ALT 03/05/2020 41  0 - 44 U/L Final  . Alkaline Phosphatase 03/05/2020 52  38 - 126 U/L Final  . Total Bilirubin 03/05/2020 1.4* 0.3 - 1.2 mg/dL Final  . GFR calc non Af Amer 03/05/2020 >60  >60 mL/min Final  . GFR calc Af Amer 03/05/2020 >  60  >60 mL/min Final  . Anion gap 03/05/2020 13  5 - 15 Final   Performed at [facility], [address] [place], [place] [address]  . Alcohol, Ethyl (B) 03/05/2020 <10  <10 mg/dL Final   Comment: (NOTE) Lowest detectable limit for serum alcohol is 10 mg/dL.  For medical purposes only. Performed at [facility], [address] [place], [place] [address]   . WBC 03/05/2020 15.7* 4.0 - 10.5 K/uL Final  . RBC 03/05/2020 4.63  4.22 - 5.81 MIL/uL Final  . Hemoglobin 03/05/2020 14.3  13.0 - 17.0 g/dL Final  . HCT 82/95/621309/11/2019 43.2  39 - 52 % Final  . MCV 03/05/2020 93.3  80.0 - 100.0 fL Final  . MCH 03/05/2020 30.9  26.0 - 34.0 pg Final  . MCHC 03/05/2020 33.1  30.0 - 36.0 g/dL Final  . RDW 08/65/784609/11/2019 12.6  11.5 - 15.5 % Final  . Platelets 03/05/2020 296  150 - 400 K/uL Final  . nRBC 03/05/2020 0.0  0.0 - 0.2 % Final   Performed at [facility], [address] [place], [place] [address]  . Opiates 03/05/2020 NONE DETECTED  NONE DETECTED Final  . Cocaine 03/05/2020 POSITIVE* NONE DETECTED Final  . Benzodiazepines 03/05/2020 NONE DETECTED  NONE DETECTED Final  . Amphetamines 03/05/2020 POSITIVE* NONE DETECTED Final  . Tetrahydrocannabinol 03/05/2020 POSITIVE* NONE DETECTED Final  . Barbiturates 03/05/2020 NONE DETECTED  NONE DETECTED Final   Comment: (NOTE) DRUG SCREEN FOR MEDICAL PURPOSES ONLY.  IF CONFIRMATION IS  NEEDED FOR ANY PURPOSE, NOTIFY LAB WITHIN 5 DAYS.  LOWEST DETECTABLE LIMITS FOR URINE DRUG SCREEN Drug Class                     Cutoff (ng/mL) Amphetamine and metabolites    1000 Barbiturate and metabolites    200 Benzodiazepine                 200 Tricyclics and metabolites     300 Opiates and metabolites        300 Cocaine and metabolites        300 THC                            50 Performed at [facility], [address] [place], [place] [address]   Admission on 03/03/2020, Discharged on 03/03/2020  Component Date Value Ref Range Status  . Sodium 03/03/2020 137  135 - 145 mmol/L Final  . Potassium 03/03/2020 3.6  3.5 - 5.1 mmol/L Final  . Chloride 03/03/2020 99  98 - 111 mmol/L Final  . CO2 03/03/2020 26  22 - 32 mmol/L Final  . Glucose, Bld 03/03/2020 113* 70 - 99 mg/dL Final   Glucose reference range applies only to samples taken after fasting for at least 8 hours.  . BUN 03/03/2020 11  6 - 20 mg/dL Final  . Creatinine, Ser 03/03/2020 1.11  0.61 - 1.24 mg/dL Final  . Calcium 24/40/102709/09/2019 9.0  8.9 - 10.3 mg/dL Final  . Total Protein 03/03/2020 7.0  6.5 - 8.1 g/dL Final  . Albumin 25/36/644009/09/2019 4.3  3.5 - 5.0 g/dL Final  . AST 34/74/259509/09/2019 27  15 - 41 U/L Final  . ALT 03/03/2020 23  0 - 44 U/L Final  . Alkaline Phosphatase 03/03/2020 49  38 - 126 U/L Final  . Total Bilirubin 03/03/2020 1.2  0.3 - 1.2  mg/dL Final  . GFR calc non Af Amer 03/03/2020 >60  >60 mL/min Final  . GFR calc Af Amer 03/03/2020 >60  >60 mL/min Final  . Anion gap 03/03/2020 12  5 - 15 Final   Performed at [facility], [address] [place], [place] [address]  . Alcohol, Ethyl (B) 03/03/2020 <10  <10 mg/dL Final   Comment: (NOTE) Lowest detectable limit for serum alcohol is 10 mg/dL.  For medical purposes only. Performed at [facility], [address] [place], [place] [address]   . Salicylate Lvl 03/03/2020 <7.0* 7.0 - 30.0 mg/dL Final   Performed at [facility],  [address], [place], [place] [address]  . Acetaminophen (Tylenol), Serum 03/03/2020 <10* 10 - 30 ug/mL Final   Comment: (NOTE) Therapeutic concentrations vary significantly. A range of 10-30 ug/mL  may be an effective concentration for many patients. However, some  are best treated at concentrations outside of this range. Acetaminophen concentrations >150 ug/mL at 4 hours after ingestion  and >50 ug/mL at 12 hours after ingestion are often associated with  toxic reactions.  Performed at [facility], [address], [place], [place] [address]   . WBC 03/03/2020 9.9  4.0 - 10.5 K/uL Final  . RBC 03/03/2020 4.46  4.22 - 5.81 MIL/uL Final  . Hemoglobin 03/03/2020 13.9  13.0 - 17.0 g/dL Final  . HCT 65/78/4696 42.6  39 - 52 % Final  . MCV 03/03/2020 95.5  80.0 - 100.0 fL Final  . MCH 03/03/2020 31.2  26.0 - 34.0 pg Final  . MCHC 03/03/2020 32.6  30.0 - 36.0 g/dL Final  . RDW 29/52/8413 12.8  11.5 - 15.5 % Final  . Platelets 03/03/2020 244  150 - 400 K/uL Final  . nRBC 03/03/2020 0.0  0.0 - 0.2 % Final   Performed at [facility], [address], [place], [place] [address]  . Opiates 03/03/2020 POSITIVE* NONE DETECTED Final  . Cocaine 03/03/2020 POSITIVE* NONE DETECTED Final  . Benzodiazepines 03/03/2020 NONE DETECTED  NONE DETECTED Final  . Amphetamines 03/03/2020 POSITIVE* NONE DETECTED Final  . Tetrahydrocannabinol 03/03/2020 POSITIVE* NONE DETECTED Final  . Barbiturates 03/03/2020 NONE DETECTED  NONE DETECTED Final   Comment: (NOTE) DRUG SCREEN FOR MEDICAL PURPOSES ONLY.  IF CONFIRMATION IS NEEDED FOR ANY PURPOSE, NOTIFY LAB WITHIN 5 DAYS.  LOWEST DETECTABLE LIMITS FOR URINE DRUG SCREEN Drug Class                     Cutoff (ng/mL) Amphetamine and metabolites    1000 Barbiturate and metabolites    200 Benzodiazepine                 200 Tricyclics and metabolites     300 Opiates and metabolites        300 Cocaine and metabolites        300 THC                             50 Performed at [facility] Lab, [address], [place], [place] [address]   . SARS Coronavirus 2 03/03/2020 NEGATIVE  NEGATIVE Final   Comment: (NOTE) SARS-CoV-2 target nucleic acids are NOT DETECTED.  The SARS-CoV-2 RNA is generally detectable in upper and lower respiratory specimens during the acute phase of infection. The lowest concentration of SARS-CoV-2 viral copies this assay can detect is 250  copies / mL. A negative result does not preclude SARS-CoV-2 infection and should not be used as the sole basis for treatment or other patient management decisions.  A negative result may occur with improper specimen collection / handling, submission of specimen other than nasopharyngeal swab, presence of viral mutation(s) within the areas targeted by this assay, and inadequate number of viral copies (<250 copies / mL). A negative result must be combined with clinical observations, patient history, and epidemiological information.  Fact Sheet for Patients:   BoilerBrush.com.cy  Fact Sheet for Healthcare Providers: https://pope.com/  This test is not yet approved or                           cleared by the [place] FDA and has been authorized for detection and/or diagnosis of SARS-CoV-2 by FDA under an Emergency Use Authorization (EUA).  This EUA will remain in effect (meaning this test can be used) for the duration of the COVID-19 declaration under Section 564(b)(1) of the Act, 21 U.S.C. section 360bbb-3(b)(1), unless the authorization is terminated or revoked sooner.  Performed at [facility], [address] [place], [place] [address]     Allergies: Escitalopram oxalate and Contrast media [iodinated diagnostic agents]  PTA Medications: (Not in a hospital admission)   Medical Decision Making  Based on my evaluation, the patient is to be placed under continuous assessment with [facility]. Clonidine protocol has  been initiated for the inpatient to treat withdrawal symptoms while under continuous assessment.   Recommendations  Based on my evaluation the patient does not appear to have an emergency medical condition.  Meta Hatchet, PA 03/27/20  12:15 AM

## 2020-03-27 MED ORDER — ACETAMINOPHEN 325 MG PO TABS: 650.0000 mg | ORAL_TABLET | Freq: Four times a day (QID) | ORAL | Status: DC | PRN

## 2020-03-27 MED ORDER — MAGNESIUM HYDROXIDE 400 MG/5ML PO SUSP: 30.0000 mL | Freq: Every day | ORAL | Status: DC | PRN

## 2020-03-27 MED ORDER — ALUM & MAG HYDROXIDE-SIMETH 200-200-20 MG/5ML PO SUSP: 30.0000 mL | ORAL | Status: DC | PRN

## 2020-03-27 NOTE — ED Notes (Signed)
Patient denies SI/HI. Patient given support and encouragement. Monitoring continues. 
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
## 2020-03-27 NOTE — ED Notes (Signed)
Pt sleeping at present, no distress noted, calm & cooperative.  Monitoring for safety. 

## 2020-03-27 NOTE — ED Notes (Signed)
Lunch given.

## 2020-03-27 NOTE — ED Notes (Signed)
Nurse Discharge Note:  D:Patient denies SI/HI/AVH at this time. Pt appears calm and cooperative, and no distress noted.  A: All Personal items in locker returned to pt. Pt given AVS/ Follow-Up / Prescriptions.  Pt escorted off unit to meet safe transport who took him home.  R:  Pt States she will comply with discharge instructions.
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
## 2020-03-27 NOTE — Progress Notes (Addendum)
Pt has been psychiatrically cleared. Referral information faxed to [facility] in admissions) for review, as pt is open to receiving detox from opioids. Admissions staff at [facility] reported, earlier this morning, that they do have detox beds available today.    Wells Guiles, MSW, LCSW, LCAS Clinical Social Worker II Disposition CSW [phone]

## 2020-03-27 NOTE — ED Provider Notes (Signed)
The patient was seen and assessed this morning. He denies suicidal and homicidal ideations. He denies auditory and visual hallucinations. He does not appear to be responding to internal or external stimuli. He reports using heroin, one gram per day, for a couple of weeks. He reports his last use was a couple of days ago. He states that he is "tired of living on drugs" and wants to get some type of long-term substance abuse treatment.

## 2020-03-27 NOTE — ED Notes (Signed)
Pt sleeping at present, no distress noted, monitoring for safety. 

## 2020-03-27 NOTE — Discharge Instructions (Addendum)
Keep scheduled appointments

## 2020-03-27 NOTE — ED Notes (Signed)
Patient is ambulating on unit. No distress noted. Monitoring continues.

## 2020-03-27 NOTE — ED Notes (Signed)
Peer Support on unit to see patient.

## 2021-05-18 ENCOUNTER — Ambulatory Visit (HOSPITAL_COMMUNITY)
Admission: EM | Admit: 2021-05-18 | Discharge: 2021-05-18 | Disposition: A | Discharge status: Home / Self Care | Payer: Medicaid Other | Attending: Physician Assistant | Admitting: Physician Assistant

## 2021-05-18 DIAGNOSIS — F331 Major depressive disorder, recurrent, moderate: Secondary | ICD-10-CM | POA: Insufficient documentation

## 2021-05-18 DIAGNOSIS — F1910 Other psychoactive substance abuse, uncomplicated: Secondary | ICD-10-CM | POA: Insufficient documentation

## 2021-05-18 DIAGNOSIS — Z9151 Personal history of suicidal behavior: Secondary | ICD-10-CM | POA: Insufficient documentation
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
## 2021-05-18 NOTE — Discharge Instructions (Addendum)
Patient provided resources for outpatient psychiatry and therapy.

## 2021-05-21 ENCOUNTER — Telehealth (HOSPITAL_COMMUNITY): Payer: Self-pay | Admitting: Internal Medicine

## 2021-05-21 NOTE — BH Assessment (Signed)
Care Management - Follow Up Discharges   Writer made contact with the patient. Patient reports that he is angry that he was discharged.  Writer apologized for the patient having a negative experiencing while at the [facility].  Writer assured the patient that all staff are here to assist him.    Patient calmed down and thanked me for following up with him after his discharge.     Patient reports that he is with his Uncle and his Kateri Mc is assisting him with obtaining outpatient mental health services.

## 2022-01-08 ENCOUNTER — Ambulatory Visit (HOSPITAL_COMMUNITY)
Admission: EM | Admit: 2022-01-08 | Discharge: 2022-01-08 | Disposition: A | Discharge status: Home / Self Care | Payer: Medicaid Other | Attending: Internal Medicine | Admitting: Internal Medicine

## 2022-01-08 ENCOUNTER — Encounter (HOSPITAL_COMMUNITY): Payer: Self-pay

## 2022-01-08 DIAGNOSIS — T148XXA Other injury of unspecified body region, initial encounter: Secondary | ICD-10-CM

## 2022-01-08 DIAGNOSIS — M79601 Pain in right arm: Secondary | ICD-10-CM

## 2022-01-08 MED ORDER — NAPROXEN 500 MG PO TABS: 500.0000 mg | ORAL_TABLET | Freq: Two times a day (BID) | ORAL | 0 refills | Status: AC

## 2022-01-08 MED ORDER — METHOCARBAMOL 500 MG PO TABS: 500.0000 mg | ORAL_TABLET | Freq: Two times a day (BID) | ORAL | 0 refills | Status: AC

## 2022-01-08 NOTE — ED Triage Notes (Signed)
Pt presents with rt arm pain x 3 months. NKI.

## 2022-01-08 NOTE — Discharge Instructions (Signed)
Take naproxen twice daily for the next 2 to 3 days, then as needed.  Take this medicine with food to avoid stomach upset.  Do not take any other NSAID containing medications while you are taking naproxen such as ibuprofen, Aleve, Motrin, Goody powders, or aspirin.  Take Robaxin muscle relaxer at nighttime for improved muscle spasm relief.  Do not take this medication and drive or go to work as it can make you very sleepy.  Apply heat to your upper arm for further pain relief.  If you develop any new or worsening symptoms or do not improve in the next 2 to 3 days, please return.  If your symptoms are severe, please go to the emergency room.  Follow-up with your primary care provider for further evaluation and management of your symptoms as well as ongoing wellness visits.  I hope you feel better!

## 2022-01-08 NOTE — ED Provider Notes (Signed)
[facility]    CSN: [id number] Arrival date & time: 01/08/22  1025      History   Chief Complaint Chief Complaint  Patient presents with   Arm Pain    HPI [name] is a 46 y.o. male.   Patient presents to urgent care for evaluation of right upper arm and shoulder pain for the last 3 months.  Denies known injury or trauma to the area.  Patient works in [employer], but states that he has not been working lately.  Denies lifting any heavy objects outside of his normal.  He has been attempting use of ibuprofen over-the-counter 600 mg every 6 hours twice a day intermittently without much relief of discomfort.  Pain comes and goes and is worse with movement.  He rates the pain at a 2 on a scale of 0-10 and describes it as an ache at this time.  He cannot identify any aggravating or relieving factors for his symptoms.  Denies any chest discomfort, shortness of breath, decreased range of motion, limitation with activities of daily living, and fever/chills.  Denies prior injury to the right upper arm.  Denies pain to the left arm, right elbow, right wrist, and bilateral hands.   Arm Pain    Past Medical History:  Diagnosis Date   Abdominal pain, recurrent    chronic lower abdominal pain   ADHD (attention deficit hyperactivity disorder)    Anxiety    BPH (benign prostatic hyperplasia)    Chronic pain    Depression    Hypertension    Narcotic abuse (HCC)    opiates    Patient Active Problem List   Diagnosis Date Noted   Substance induced mood disorder (HCC) 03/11/2018   Schizophrenia (HCC) 03/01/2018   Cocaine-induced mood disorder (HCC) 11/29/2015   Cocaine use disorder, moderate, dependence (HCC) 11/05/2015   Cannabis use disorder, moderate, dependence (HCC) 11/05/2015   Attention deficit hyperactivity disorder (ADHD) 11/05/2015   Opioid use disorder, moderate, in early remission, on maintenance therapy (HCC) 11/05/2015   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 Authorizing Provider  methocarbamol (ROBAXIN) 500 MG tablet Take 1 tablet (500 mg total) by mouth 2 (two) times daily. 01/08/22  Yes Carlisle Beers, FNP  naproxen (NAPROSYN) 500 MG tablet Take 1 tablet (500 mg total) by mouth 2 (two) times daily. 01/08/22  Yes Grazia Taffe, Donavan Burnet, FNP    Family History Family History  Problem Relation Age of Onset   Hypertension Father    Prostate cancer Other    Mental illness Maternal Aunt    Drug abuse Maternal Aunt     Social History Social History   Tobacco Use   Smoking status: Every Day    Packs/day: 0.00    Types: Cigarettes   Smokeless tobacco: Never  Substance Use Topics   Alcohol use: Yes   Drug use: Yes    Types: Oxycodone, Opium, Cocaine, Methamphetamines, Marijuana    Comment: Heroin, "pain pills"     Allergies   Escitalopram oxalate and Contrast media [iodinated contrast media]   Review of Systems Review of Systems Per HPI  Physical Exam Triage Vital Signs ED Triage Vitals  Enc Vitals Group     BP 01/08/22 1042 115/76     Pulse Rate 01/08/22 1042 97     Resp 01/08/22 1042 15     Temp 01/08/22 1042 97.8 F (36.6 C)     Temp Source 01/08/22 1042 Oral     SpO2 01/08/22 1042 96 %     Weight --      Height --      Head Circumference --      Peak Flow --      Pain Score 01/08/22 1041 0     Pain Loc --      Pain Edu? --      Excl. in GC? --    No data found.  Updated Vital Signs BP 115/76 (BP Location: Right Arm)   Pulse 97   Temp 97.8 F (36.6 C) (Oral)   Resp 15   SpO2 96%   Visual Acuity Right Eye Distance:   Left Eye Distance:   Bilateral Distance:     Right Eye Near:   Left Eye Near:    Bilateral Near:     Physical Exam Vitals and nursing note reviewed.  Constitutional:      Appearance: Normal appearance. He is not ill-appearing or toxic-appearing.     Comments: Very pleasant patient sitting on exam in position of comfort table in no acute distress.   HENT:     Head: Normocephalic and atraumatic.     Right Ear: Hearing and external ear normal.     Left Ear: Hearing and external ear normal.     Nose: Nose normal.     Mouth/Throat:     Lips: Pink.     Mouth: Mucous membranes are moist.  Eyes:     General: Lids are normal. Vision grossly intact. Gaze aligned appropriately.     Extraocular Movements: Extraocular movements intact.     Conjunctiva/sclera: Conjunctivae normal.  Pulmonary:     Effort: Pulmonary effort is normal.  Abdominal:     Palpations: Abdomen is soft.  Musculoskeletal:     Right shoulder: Tenderness present. No swelling, bony tenderness or crepitus. Normal range of motion. Normal strength. Normal pulse.     Left shoulder: Normal.     Cervical back: Neck supple.     Comments: +2 radial pulses bilaterally.  Capillary refill to bilateral upper extremities is less than 3.  Patient is neurovascularly intact with normal sensation.  Very mild tenderness to palpation of the right biceps and triceps muscles present.  Range of motion to the right arm is not limited by tenderness.  No C, T, or L-spine tenderness with palpation.  No crepitus.  No obvious deformity or signs of trauma/injury to the area.  No ecchymosis.  Skin:    General: Skin is warm and dry.     Capillary Refill: Capillary refill takes less than 2 seconds.     Findings: No rash.  Neurological:     General: No focal deficit present.     Mental Status: He is alert and oriented to person, place, and time. Mental status is at baseline.     Cranial Nerves: No dysarthria or facial asymmetry.     Gait: Gait is intact.  Psychiatric:        Mood and Affect:  Mood normal.        Speech: Speech normal.        Behavior: Behavior normal.        Thought Content: Thought content normal.  Judgment: Judgment normal.      UC Treatments / Results  Labs (all labs ordered are listed, but only abnormal results are displayed) Labs Reviewed - No data to display  EKG   Radiology No results found.  Procedures Procedures (including critical care time)  Medications Ordered in UC Medications - No data to display  Initial Impression / Assessment and Plan / UC Course  I have reviewed the triage vital signs and the nursing notes.  Pertinent labs & imaging results that were available during my care of the patient were reviewed by me and considered in my medical decision making (see chart for details).  1.  Right arm pain and muscle strain Symptomology and physical exam are consistent with muscle strain to the biceps and triceps muscles.  Patient to take naproxen twice daily for the next 2 to 3 days then as needed.  Instructed to take this medication with food to avoid GI upset and avoid all other NSAID containing medications while taking naproxen.  He may use Robaxin muscle relaxer for further muscle spasm relief at nighttime.  Advised patient not to drive or drink alcohol as it can make him sleepy while taking Robaxin medication.  Apply heat to the upper arm for further pain relief intermittently.  No clinical indication for imaging at today's visit as there is a very low suspicion for acute bony abnormality due to stable musculoskeletal exam and vital signs.   Discussed physical exam and available lab work findings in clinic with patient.  Counseled patient regarding appropriate use of medications and potential side effects for all medications recommended or prescribed today. Discussed red flag signs and symptoms of worsening condition,when to call the PCP office, return to urgent care, and when to seek higher level of care in the emergency department.  Patient verbalizes understanding and agreement with plan. All questions answered. Patient discharged in stable condition.  Final Clinical Impressions(s) / UC Diagnoses   Final diagnoses:  Right arm pain  Muscle strain     Discharge Instructions      Take naproxen twice daily for the next 2 to 3 days, then as needed.  Take this medicine with food to avoid stomach upset.  Do not take any other NSAID containing medications while you are taking naproxen such as ibuprofen, Aleve, Motrin, Goody powders, or aspirin.  Take Robaxin muscle relaxer at nighttime for improved muscle spasm relief.  Do not take this medication and drive or go to work as it can make you very sleepy.  Apply heat to your upper arm for further pain relief.  If you develop any new or worsening symptoms or do not improve in the next 2 to 3 days, please return.  If your symptoms are severe, please go to the emergency room.  Follow-up with your primary care provider for further evaluation and management of your symptoms as well as ongoing wellness visits.  I hope you feel better!     ED Prescriptions     Medication Sig Dispense Auth. Provider   naproxen (NAPROSYN) 500 MG tablet Take 1 tablet (500 mg total) by mouth 2 (two) times daily. 30 tablet Carlisle Beers, FNP   methocarbamol (ROBAXIN) 500 MG tablet Take 1 tablet (500 mg total) by mouth 2 (two) times daily. 20 tablet Carlisle Beers, FNP      PDMP not reviewed this encounter.   Carlisle Beers, [place] 01/08/22 1314
